# Patient Record
Sex: Female | Born: 1945 | Race: White | Hispanic: No | Smoking: Never smoker
Health system: Southern US, Community
[De-identification: ages and names within clinical notes are randomized; demographics above are authoritative.]

## PROBLEM LIST (undated history)

## (undated) DIAGNOSIS — M5030 Other cervical disc degeneration, unspecified cervical region: Secondary | ICD-10-CM

## (undated) DIAGNOSIS — M542 Cervicalgia: Secondary | ICD-10-CM

## (undated) DIAGNOSIS — M5481 Occipital neuralgia: Secondary | ICD-10-CM

## (undated) DIAGNOSIS — G4700 Insomnia, unspecified: Secondary | ICD-10-CM

## (undated) DIAGNOSIS — M47812 Spondylosis without myelopathy or radiculopathy, cervical region: Secondary | ICD-10-CM

## (undated) DIAGNOSIS — N329 Bladder disorder, unspecified: Secondary | ICD-10-CM

## (undated) DIAGNOSIS — R935 Abnormal findings on diagnostic imaging of other abdominal regions, including retroperitoneum: Secondary | ICD-10-CM

## (undated) DIAGNOSIS — M810 Age-related osteoporosis without current pathological fracture: Secondary | ICD-10-CM

## (undated) DIAGNOSIS — E785 Hyperlipidemia, unspecified: Secondary | ICD-10-CM

## (undated) DIAGNOSIS — K219 Gastro-esophageal reflux disease without esophagitis: Secondary | ICD-10-CM

## (undated) DIAGNOSIS — G43909 Migraine, unspecified, not intractable, without status migrainosus: Secondary | ICD-10-CM

## (undated) DIAGNOSIS — F5081 Binge eating disorder: Secondary | ICD-10-CM

## (undated) DIAGNOSIS — F50819 Binge eating disorder, unspecified: Secondary | ICD-10-CM

## (undated) HISTORY — DX: Bladder disorder, unspecified: N32.9

## (undated) HISTORY — DX: Hyperlipidemia, unspecified: E78.5

## (undated) HISTORY — PX: TUBAL LIGATION: SHX77

## (undated) HISTORY — DX: Insomnia, unspecified: G47.00

## (undated) HISTORY — PX: FOOT SURGERY: SHX648

## (undated) HISTORY — DX: Migraine, unspecified, not intractable, without status migrainosus: G43.909

## (undated) HISTORY — PX: ADENOIDECTOMY: SUR15

---

## 1998-03-05 ENCOUNTER — Ambulatory Visit (HOSPITAL_COMMUNITY): Admission: RE | Admit: 1998-03-05 | Discharge: 1998-03-05 | Payer: Self-pay | Admitting: Obstetrics and Gynecology

## 1998-03-05 ENCOUNTER — Other Ambulatory Visit: Admission: RE | Admit: 1998-03-05 | Discharge: 1998-03-05 | Payer: Self-pay | Admitting: Obstetrics and Gynecology

## 1998-03-21 ENCOUNTER — Other Ambulatory Visit: Admission: RE | Admit: 1998-03-21 | Discharge: 1998-03-21 | Payer: Self-pay | Admitting: *Deleted

## 1999-04-30 ENCOUNTER — Ambulatory Visit (HOSPITAL_COMMUNITY): Admission: RE | Admit: 1999-04-30 | Discharge: 1999-04-30 | Payer: Self-pay | Admitting: Gastroenterology

## 1999-04-30 ENCOUNTER — Encounter (INDEPENDENT_AMBULATORY_CARE_PROVIDER_SITE_OTHER): Payer: Self-pay | Admitting: Specialist

## 2000-03-30 ENCOUNTER — Other Ambulatory Visit: Admission: RE | Admit: 2000-03-30 | Discharge: 2000-03-30 | Payer: Self-pay | Admitting: Obstetrics and Gynecology

## 2000-07-08 ENCOUNTER — Ambulatory Visit (HOSPITAL_COMMUNITY): Admission: RE | Admit: 2000-07-08 | Discharge: 2000-07-08 | Payer: Self-pay | Admitting: Obstetrics and Gynecology

## 2000-07-08 ENCOUNTER — Encounter: Payer: Self-pay | Admitting: Obstetrics and Gynecology

## 2001-04-04 ENCOUNTER — Other Ambulatory Visit: Admission: RE | Admit: 2001-04-04 | Discharge: 2001-04-04 | Payer: Self-pay | Admitting: Obstetrics and Gynecology

## 2002-03-08 ENCOUNTER — Encounter: Payer: Self-pay | Admitting: Obstetrics and Gynecology

## 2002-03-08 ENCOUNTER — Ambulatory Visit (HOSPITAL_COMMUNITY): Admission: RE | Admit: 2002-03-08 | Discharge: 2002-03-08 | Payer: Self-pay | Admitting: Obstetrics and Gynecology

## 2002-05-18 ENCOUNTER — Other Ambulatory Visit: Admission: RE | Admit: 2002-05-18 | Discharge: 2002-05-18 | Payer: Self-pay | Admitting: Obstetrics and Gynecology

## 2003-05-25 ENCOUNTER — Encounter: Admission: RE | Admit: 2003-05-25 | Discharge: 2003-05-25 | Payer: Self-pay | Admitting: Family Medicine

## 2003-07-09 ENCOUNTER — Other Ambulatory Visit: Admission: RE | Admit: 2003-07-09 | Discharge: 2003-07-09 | Payer: Self-pay | Admitting: Obstetrics and Gynecology

## 2003-10-19 ENCOUNTER — Emergency Department (HOSPITAL_COMMUNITY): Admission: EM | Admit: 2003-10-19 | Discharge: 2003-10-19 | Payer: Self-pay | Admitting: Emergency Medicine

## 2004-06-10 ENCOUNTER — Ambulatory Visit: Payer: Self-pay | Admitting: Gastroenterology

## 2004-07-01 ENCOUNTER — Ambulatory Visit: Payer: Self-pay | Admitting: Gastroenterology

## 2004-07-01 LAB — HM COLONOSCOPY: HM Colonoscopy: NORMAL

## 2004-08-13 ENCOUNTER — Encounter: Admission: RE | Admit: 2004-08-13 | Discharge: 2004-08-13 | Payer: Self-pay | Admitting: Orthopedic Surgery

## 2005-12-30 ENCOUNTER — Ambulatory Visit: Payer: Self-pay | Admitting: Family Medicine

## 2006-01-06 ENCOUNTER — Ambulatory Visit: Payer: Self-pay | Admitting: Family Medicine

## 2006-01-07 ENCOUNTER — Emergency Department (HOSPITAL_COMMUNITY): Admission: EM | Admit: 2006-01-07 | Discharge: 2006-01-07 | Payer: Self-pay | Admitting: Emergency Medicine

## 2006-05-14 ENCOUNTER — Ambulatory Visit: Payer: Self-pay | Admitting: Family Medicine

## 2006-05-18 ENCOUNTER — Ambulatory Visit: Payer: Self-pay | Admitting: Internal Medicine

## 2006-06-30 ENCOUNTER — Ambulatory Visit: Payer: Self-pay | Admitting: Family Medicine

## 2006-08-20 ENCOUNTER — Ambulatory Visit: Payer: Self-pay | Admitting: Family Medicine

## 2006-08-27 ENCOUNTER — Ambulatory Visit: Payer: Self-pay | Admitting: Family Medicine

## 2006-08-27 LAB — CONVERTED CEMR LAB
ALT: 16 units/L (ref 0–40)
AST: 24 units/L (ref 0–37)
HDL: 57.6 mg/dL (ref >39.0)
LDL Cholesterol: 65 mg/dL (ref 0–99)
VLDL: 15 mg/dL (ref 0–40)

## 2006-09-06 ENCOUNTER — Encounter: Admission: RE | Admit: 2006-09-06 | Discharge: 2006-09-06 | Payer: Self-pay | Admitting: Obstetrics and Gynecology

## 2006-09-14 ENCOUNTER — Ambulatory Visit: Payer: Self-pay | Admitting: Family Medicine

## 2007-01-12 ENCOUNTER — Ambulatory Visit: Payer: Self-pay | Admitting: Family Medicine

## 2007-01-12 DIAGNOSIS — G4700 Insomnia, unspecified: Secondary | ICD-10-CM | POA: Insufficient documentation

## 2007-01-12 DIAGNOSIS — R519 Headache, unspecified: Secondary | ICD-10-CM | POA: Insufficient documentation

## 2007-01-12 DIAGNOSIS — E785 Hyperlipidemia, unspecified: Secondary | ICD-10-CM | POA: Insufficient documentation

## 2007-01-12 DIAGNOSIS — R51 Headache: Secondary | ICD-10-CM

## 2007-01-12 LAB — CONVERTED CEMR LAB
Basophils Absolute: 0.1 10*3/uL (ref 0.0–0.1)
Cholesterol: 185 mg/dL (ref 0–200)
HCT: 39.6 % (ref 36.0–46.0)
HDL: 56.7 mg/dL (ref >39.0)
Hemoglobin: 13.7 g/dL (ref 12.0–15.0)
LDL Cholesterol: 112 mg/dL — ABNORMAL HIGH (ref 0–99)
Lymphocytes Relative: 32.4 % (ref 12.0–46.0)
MCHC: 34.6 g/dL (ref 30.0–36.0)
Monocytes Absolute: 0.3 10*3/uL (ref 0.2–0.7)
Monocytes Relative: 7.1 % (ref 3.0–11.0)
Neutro Abs: 2.6 10*3/uL (ref 1.4–7.7)
Neutrophils Relative %: 56.8 % (ref 43.0–77.0)
RDW: 12.8 % (ref 11.5–14.6)
TSH: 1.42 microintl units/mL (ref 0.35–5.50)

## 2007-01-13 ENCOUNTER — Telehealth (INDEPENDENT_AMBULATORY_CARE_PROVIDER_SITE_OTHER): Payer: Self-pay | Admitting: *Deleted

## 2007-03-29 ENCOUNTER — Ambulatory Visit: Payer: Self-pay | Admitting: Family Medicine

## 2007-03-29 DIAGNOSIS — M279 Disease of jaws, unspecified: Secondary | ICD-10-CM | POA: Insufficient documentation

## 2007-03-30 ENCOUNTER — Encounter (INDEPENDENT_AMBULATORY_CARE_PROVIDER_SITE_OTHER): Payer: Self-pay | Admitting: Family Medicine

## 2007-04-19 ENCOUNTER — Telehealth (INDEPENDENT_AMBULATORY_CARE_PROVIDER_SITE_OTHER): Payer: Self-pay | Admitting: *Deleted

## 2007-04-25 ENCOUNTER — Encounter (INDEPENDENT_AMBULATORY_CARE_PROVIDER_SITE_OTHER): Payer: Self-pay | Admitting: Family Medicine

## 2007-05-17 ENCOUNTER — Encounter (INDEPENDENT_AMBULATORY_CARE_PROVIDER_SITE_OTHER): Payer: Self-pay | Admitting: Family Medicine

## 2007-06-15 ENCOUNTER — Telehealth (INDEPENDENT_AMBULATORY_CARE_PROVIDER_SITE_OTHER): Payer: Self-pay | Admitting: *Deleted

## 2007-08-11 ENCOUNTER — Telehealth (INDEPENDENT_AMBULATORY_CARE_PROVIDER_SITE_OTHER): Payer: Self-pay | Admitting: *Deleted

## 2007-08-22 ENCOUNTER — Ambulatory Visit: Payer: Self-pay | Admitting: Family Medicine

## 2007-08-22 DIAGNOSIS — R42 Dizziness and giddiness: Secondary | ICD-10-CM | POA: Insufficient documentation

## 2007-08-23 ENCOUNTER — Encounter (INDEPENDENT_AMBULATORY_CARE_PROVIDER_SITE_OTHER): Payer: Self-pay | Admitting: *Deleted

## 2007-08-23 LAB — CONVERTED CEMR LAB
ALT: 23 units/L (ref 0–35)
Cholesterol: 176 mg/dL (ref 0–200)
LDL Cholesterol: 73 mg/dL (ref 0–99)
Total CHOL/HDL Ratio: 2
Triglycerides: 75 mg/dL (ref 0–149)

## 2007-08-29 ENCOUNTER — Telehealth (INDEPENDENT_AMBULATORY_CARE_PROVIDER_SITE_OTHER): Payer: Self-pay | Admitting: *Deleted

## 2007-10-27 ENCOUNTER — Encounter: Admission: RE | Admit: 2007-10-27 | Discharge: 2007-10-27 | Payer: Self-pay | Admitting: Obstetrics and Gynecology

## 2009-02-11 ENCOUNTER — Encounter: Admission: RE | Admit: 2009-02-11 | Discharge: 2009-02-11 | Payer: Self-pay | Admitting: Obstetrics and Gynecology

## 2009-02-22 ENCOUNTER — Encounter: Admission: RE | Admit: 2009-02-22 | Discharge: 2009-02-22 | Payer: Self-pay | Admitting: Obstetrics and Gynecology

## 2009-06-19 ENCOUNTER — Encounter (INDEPENDENT_AMBULATORY_CARE_PROVIDER_SITE_OTHER): Payer: Self-pay | Admitting: *Deleted

## 2010-05-01 ENCOUNTER — Encounter: Admission: RE | Admit: 2010-05-01 | Discharge: 2010-05-01 | Payer: Self-pay | Admitting: Obstetrics and Gynecology

## 2010-06-19 ENCOUNTER — Ambulatory Visit: Admit: 2010-06-19 | Payer: Self-pay | Admitting: Internal Medicine

## 2010-07-06 ENCOUNTER — Encounter: Payer: Self-pay | Admitting: Obstetrics and Gynecology

## 2010-07-07 ENCOUNTER — Encounter: Payer: Self-pay | Admitting: Obstetrics and Gynecology

## 2010-07-17 NOTE — Letter (Signed)
Summary: Colonoscopy Date Change Letter  [facility]  [address] [place], [place] [address]   Phone: [phone]  Fax: [phone]      June 19, 2009 MRN: [id number]   Kylie Wheeler [address] [place], [place]  [address]   Dear Ms. UNDERWOOD,   Previously you were recommended to have a repeat colonoscopy around this time. Your chart was recently reviewed by Dr. Arlyce Dice of [facility]. Follow up colonoscopy is now recommended in Jan. 2013. This revised recommendation is based on current, nationally recognized guidelines for colorectal cancer screening and polyp surveillance. These guidelines are endorsed by the American Cancer Society, [employer] on Colorectal Cancer as well as numerous other major medical organizations.  Please understand that our recommendation assumes that you do not have any new symptoms such as bleeding, a change in bowel habits, anemia, or significant abdominal discomfort. If you do have any concerning GI symptoms or want to discuss the guideline recommendations, please call to arrange an office visit at your earliest convenience. Otherwise we will keep you in our reminder system and contact you 1-2 months prior to the date listed above to schedule your next colonoscopy.  Thank you,  Barbette Hair. Arlyce Dice, M.D.  [facility] [phone]

## 2010-08-14 ENCOUNTER — Encounter (INDEPENDENT_AMBULATORY_CARE_PROVIDER_SITE_OTHER): Payer: 59 | Admitting: Internal Medicine

## 2010-08-14 ENCOUNTER — Other Ambulatory Visit: Payer: Self-pay | Admitting: Internal Medicine

## 2010-08-14 ENCOUNTER — Encounter: Payer: Self-pay | Admitting: Internal Medicine

## 2010-08-14 DIAGNOSIS — E785 Hyperlipidemia, unspecified: Secondary | ICD-10-CM

## 2010-08-14 DIAGNOSIS — Z0000 Encounter for general adult medical examination without abnormal findings: Secondary | ICD-10-CM

## 2010-08-14 DIAGNOSIS — Z860100 Personal history of colon polyps, unspecified: Secondary | ICD-10-CM | POA: Insufficient documentation

## 2010-08-14 DIAGNOSIS — M810 Age-related osteoporosis without current pathological fracture: Secondary | ICD-10-CM | POA: Insufficient documentation

## 2010-08-14 DIAGNOSIS — E559 Vitamin D deficiency, unspecified: Secondary | ICD-10-CM | POA: Insufficient documentation

## 2010-08-14 DIAGNOSIS — Z8249 Family history of ischemic heart disease and other diseases of the circulatory system: Secondary | ICD-10-CM

## 2010-08-14 LAB — CONVERTED CEMR LAB
Cholesterol, target level: 200 mg/dL
HDL goal, serum: 40 mg/dL
LDL Goal: 160 mg/dL

## 2010-08-14 LAB — CBC WITH DIFFERENTIAL/PLATELET
Basophils Absolute: 0 10*3/uL (ref 0.0–0.1)
Eosinophils Absolute: 0.1 10*3/uL (ref 0.0–0.7)
MCHC: 34.5 g/dL (ref 30.0–36.0)
MCV: 93.8 fl (ref 78.0–100.0)
Monocytes Absolute: 0.3 10*3/uL (ref 0.1–1.0)
Neutrophils Relative %: 54.7 % (ref 43.0–77.0)
Platelets: 199 10*3/uL (ref 150.0–400.0)

## 2010-08-14 LAB — BASIC METABOLIC PANEL
BUN: 14 mg/dL (ref 6–23)
CO2: 30 mEq/L (ref 19–32)
Calcium: 9.5 mg/dL (ref 8.4–10.5)
Chloride: 102 mEq/L (ref 96–112)
Creatinine, Ser: 0.9 mg/dL (ref 0.4–1.2)

## 2010-08-14 LAB — HEPATIC FUNCTION PANEL
Bilirubin, Direct: 0.1 mg/dL (ref 0.0–0.3)
Total Bilirubin: 0.7 mg/dL (ref 0.3–1.2)
Total Protein: 6.9 g/dL (ref 6.0–8.3)

## 2010-08-15 LAB — CONVERTED CEMR LAB: Vit D, 25-Hydroxy: 23 ng/mL — ABNORMAL LOW (ref 30–89)

## 2010-08-21 NOTE — Assessment & Plan Note (Signed)
Summary: cpx/fasting/kn/ph   Vital Signs:  Patient profile:   65 year old female Height:      61.75 inches Weight:      114.8 pounds BMI:     21.24 Temp:     98.0 degrees F oral Pulse rate:   63 / minute Resp:     14 per minute BP sitting:   131 / 73  (left arm) Cuff size:   regular  Vitals Entered By: Shonna Chock CMA (August 14, 2010 9:44 AM) CC: CPX with fasting labs, EKG completed by insurance company 2 days ago, Lipid Management Comments Patient taking med for Migraines: ? name ? dose    CC:  CPX with fasting labs, EKG completed by insurance company 2 days ago, and Lipid Management.  History of Present Illness:    [name] is here for a physical; she is asymptomatic. She has not been on a statin 18 months ago ; " I was told I didn't need it". She  denies the following symptoms: chest pain/pressure, exercise intolerance, dypsnea, palpitations, syncope, and pedal edema.  Dietary compliance has been good.  The patient reports no exercise.  Adjunctive measures currently used by the patient include fiber and fish oil supplements.    Lipid Management History:      Positive NCEP/ATP III risk factors include female age 38 years old or older.  Negative NCEP/ATP III risk factors include non-diabetic, HDL cholesterol greater than 60, no family history for ischemic heart disease, non-tobacco-user status, non-hypertensive, no ASHD (atherosclerotic heart disease), no prior stroke/TIA, no peripheral vascular disease, and no history of aortic aneurysm.     Current Medications (verified): 1)  Trazodone Hcl 50 Mg Tabs (Trazodone Hcl) .... 2 By Mouth At Bedtime 2)  Clorazepate Dipotassium 7.5 Mg Tabs (Clorazepate Dipotassium) .... 2 By Mouth Once Daily 3)  Actonel 150 Mg Tabs (Risedronate Sodium) .Marland Kitchen.. 1 By Mouth Monthly  Allergies (verified): 1)  ! Codeine  Past History:  Past Medical History: Headache, migraines, Dr Vela Prose Hyperlipidemia: [place] Study LDL goal = <  160. 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         "Gas" an issue for 12-18 mos.  Physical Exam  General:  well-nourished;alert,appropriate and cooperative throughout examination Head:  Normocephalic and atraumatic without obvious abnormalities. Eyes:  No corneal or conjunctival inflammation noted. Perrla. Funduscopic exam benign, without hemorrhages, exudates or papilledema.  Ears:  External ear exam shows no significant lesions or deformities.  Otoscopic examination reveals clear canals, tympanic membranes are intact bilaterally without bulging, retraction, inflammation or discharge. Hearing is grossly normal bilaterally. Nose:  External nasal examination shows no deformity or inflammation. Nasal mucosa are pink and moist without lesions or exudates. Septal dislocation & deviation Mouth:  Oral mucosa and oropharynx without lesions or exudates.  Teeth in good repair.  Neck:  No deformities, masses, or tenderness noted. Lungs:  Normal respiratory effort, chest expands symmetrically. Lungs are clear to auscultation, no crackles or wheezes. Heart:  regular rhythm, no murmur, no gallop, no rub, no JVD, no  HJR, and bradycardia.   Abdomen:  Bowel sounds positive,abdomen soft and non-tender without masses, organomegaly or hernias noted. Aorta palpable; no AAA Genitalia:  as per Gyn Msk:  Scoliosis Pulses:  R and L carotid,radial,dorsalis pedis and posterior tibial pulses are full and equal bilaterally Extremities:  No clubbing, cyanosis, edema, or deformity noted with normal full range of motion of all joints.  Crepitus of kneees Neurologic:  alert & oriented X3 and DTRs symmetrical and 1/2+ @ knees Skin:  Intact without suspicious lesions or rashes. Small keratotic lesion Cervical Nodes:  No lymphadenopathy noted Axillary Nodes:  No palpable lymphadenopathy Psych:  memory intact for recent and remote, normally interactive, and good eye contact.     Impression & Recommendations:  Problem # 1:  ROUTINE GENERAL MEDICAL EXAM@HEALTH  CARE FACL (ICD-V70.0)  Orders: Venipuncture (16109) TLB-BMP (Basic Metabolic Panel-BMET) (80048-METABOL) TLB-CBC Platelet - w/Differential (85025-CBCD) TLB-Hepatic/Liver Function Pnl (80076-HEPATIC) TLB-TSH (Thyroid Stimulating Hormone) (84443-TSH) T-Vitamin D (25-Hydroxy) (60454-09811) T- * Misc. Laboratory test [phone])  Problem # 2:  HYPERLIPIDEMIA (ICD-272.4)  The following medications were removed from the medication list:    Zocor 40 Mg Tabs (Simvastatin) .Marland Kitchen... Take 1/2 tablet once daily  Problem # 3:  OSTEOPOROSIS (ICD-733.00)  Her updated medication list for this problem includes:    Actonel 150 Mg Tabs (Risedronate sodium) .Marland Kitchen... 1 by mouth monthly  Problem # 4:  COLONIC POLYPS, HX OF (ICD-V12.72)  Problem # 5:  ISCHEMIC HEART DISEASE, FAMILY HX (ICD-V17.3)  Complete Medication List: 1)  Trazodone Hcl 50 Mg Tabs (Trazodone hcl) .... 2 by mouth at bedtime 2)  Clorazepate Dipotassium 7.5 Mg Tabs (Clorazepate dipotassium) .... 2 by mouth once daily 3)  Actonel 150 Mg Tabs (Risedronate sodium) .Marland Kitchen.. 1 by mouth monthly  Lipid Assessment/Plan:       Based on NCEP/ATP III, the patient's risk factor category is "0-1 risk factors".  The patient's lipid goals are as follows: Total cholesterol goal is 200; LDL cholesterol goal is 160; HDL cholesterol goal is 40; Triglyceride goal is 150.    Patient Instructions: 1)  Obtain copy of insurance EKG if possible. Trial of Align once daily as needed for gas. Keep a food diary as discussed. Prescriptions: TRAZODONE HCL 50 MG TABS (TRAZODONE HCL) 2 by mouth at bedtime  #180 x 0   Entered and Authorized by:   Marga Melnick MD   Signed by:   Marga Melnick MD on 08/14/2010   Method used:   Print then Give to Patient   RxID:   [id number] CLORAZEPATE DIPOTASSIUM 7.5 MG TABS (CLORAZEPATE DIPOTASSIUM) 2 by mouth once daily  #180 x 0   Entered and Authorized by:   Marga Melnick MD   Signed by:   Marga Melnick MD on 08/14/2010   Method used:   Print then Give to Patient   RxID:   [id number]    Orders Added: 1)  Est. Patient 40-64 years [99396] 2)  Venipuncture [72536] 3)  TLB-BMP (Basic Metabolic Panel-BMET) [80048-METABOL] 4)  TLB-CBC Platelet - w/Differential [85025-CBCD] 5)  TLB-Hepatic/Liver Function Pnl [80076-HEPATIC] 6)  TLB-TSH (Thyroid Stimulating Hormone) [84443-TSH] 7)  T-Vitamin D (25-Hydroxy) [64403-47425] 8)  T- * Misc. Laboratory test [phone]  Appended Document: cpx/fasting/kn/ph

## 2010-08-26 NOTE — Letter (Signed)
Summary: [employer] Personalized Risk Profile  [employer] Personalized Risk Profile   Imported By: Maryln Gottron 08/19/2010 10:03:28  _____________________________________________________________________  External Attachment:    Type:   Image     Comment:   External Document

## 2010-09-05 ENCOUNTER — Encounter: Payer: Self-pay | Admitting: Internal Medicine

## 2010-10-31 NOTE — Assessment & Plan Note (Signed)
[facility]                        [name]   Kylie, Wheeler                    MRN:          [id number]  DATE:06/30/2006                            DOB:          1946-03-28    REASON FOR VISIT:  Eye problem.  [name] presents today stating  that she went to see her ophthalmologist, Dr. Lovell Sheehan, and he noticed a  significant decrease in the acuity of her left eye.  He has scheduled  her for a dye test next week.  She states that she was talking to her  friends about this and they made her aware that she had an injury before  Thanksgiving.  The patient states that she has fallen and hit the back  of her head on the left side.  She had a large knot on the side of the  head.  She told a friend about this and the friend expressed a concern  stating that a person she knew died suddenly after having a dye test.  According to her friend, the patient did not advise the doctor of a  previous head injury, and they think that is what complicated the  situation.  She was recommended to follow up with me by her friend.  The  patient states that she never passed out when she fell and hit her head.  She was just concerned based on the information provided by her friend.  She is scheduled again, on Monday, to have a dye test to assess the  reason for decreased acuity of her left eye.  She has not advised Dr.  Lovell Sheehan of the fall.   MEDICATIONS:  1. Zocor 40 mg.  2. Ambien CR 12.5 nightly.  3. Protonix 40 mg daily.  4. Tranxene 1/2 in the morning and 1 at night dose unknown at this      point.  5. Frova 2.5 mg as needed for migraines.   ALLERGIES:  No known drug allergies.   OBJECTIVE:  The patient reclined.  Weight checked.  Pulse 60.  Blood  pressure 140/80.  GENERAL:  We have a pleasant female in no acute distress.  HEENT:  Normocephalic, atraumatic.  There are no obvious lesions noted  on the scalp.  Pupils were both  equal and reactive to light.  Extraocular motions intact.  Fundi was benign-appearing on my  examination.  NEUROLOGIC:  Nonfocal.   IMPRESSION:  21. A 65 year old female with noted history of decreased acuity of the      left eye, scheduled to have further evaluation by Dr. Lovell Sheehan.  2. Remote history of head trauma prior to Thanksgiving.  She wants to      make sure there is no contraindication to the dye test.   PLAN:  I advised the patient to call Dr. Lovell Sheehan and advise him of the  above.  He may recommend additional testing prior to doing the dye test.  Advised her to not have the test until  she has spoken to him and he has made his recommendations.  I also  advised her to call me  with his  recommendations prior to having this test.  The patient  expressed understanding.  I refilled her Frova 2.5 mg with 6 refills to  use as needed.     [name], M.D.  Electronically Signed    LA/MedQ  DD: 06/30/2006  DT: 06/30/2006  Job #: [id number]

## 2010-10-31 NOTE — Assessment & Plan Note (Signed)
[facility]                          [name]   Kylie Wheeler, Kylie Wheeler                    MRN:          [id number]  DATE:12/30/2005                            DOB:          1945/07/05    Please see complete physical examination form for further details.  Additionally, the patient has several complaints.  1.  She complains of intermittent upset stomach associated with left chest      pain.  This has been assessed in the past by cardiology.  According to      the patient, she had a normal Cardiolite stress test two years ago.  She      states that her symptoms continue intermittently.  Over the last several      months, she has noted a correlation between foods, stomach pain, and      chest discomfort.  On further questioning, she has a history of      recurrent migraines that she treats on a regular basis with Lodine and      Cafergot.  She has been doing this for years.  She describes her      sensation as some nausea and mild epigastric discomfort.  It is      sometimes associated with food but also can happen on an empty stomach.      She denies any vomiting or diarrhea.  She denies any hemoptysis or blood      in her stool.  2.  She also complains of a long history of numbness on the tip of her      fingers, left a little greater than right.  It sometimes radiates up her      forearm.  Not associated with any other symptoms.  She does have a      history of intermittent neck pain and revealed that she does have a      history of degenerative disk disease.  3.  [name] also has a history of insomnia and used to be on      nortriptyline which was eventually switched over to Ambien 10 mg.  After      having significant side effects from Ambien, she was switched over to      Ambien CR 12.5.  Unfortunately, she states that she has difficulties      with waking up in the middle of the night with the most recent medicine,    i.e. Ambien CR.  She would prefer to go back to regular Ambien and would      consider nortriptyline again.  Of note, she is scheduled to see a      psychiatrist in the near future to be restarted on an antidepressant.      She was advised by her therapist and given a name of a psychiatrist.   Please see physical examination form for further details regarding the  physical exam.   IMPRESSION:  1.  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  2.  Insomnia.  3.  Upper extremity paresthesia, left greater  than right.   PLAN:  1.  We will check an H. pylori.  2.  We will start patient on Nexium 40 mg daily for the next four weeks.      She is to follow up thereafter.  3.  Given that Lodine has worked for the patient, and her neurologist feels      she should continue, I advised her to minimize the use, which according      to the patient, she was also recommended to have by her neurologist.  4.  In regard to her insomnia, we will change her prescription back to      Ambien 5 mg to 10 mg q.h.s. daily.  She was given a prescription for 90      days with one refill.  5.  In regard to her upper extremity paresthesia/neuropathy, we will refer      patient to neurology      for further assessment.  6.  Please see physical examination form for further details regarding her      annual exam.                                   [name], MD   [facility]  DD:  12/30/2005  DT:  12/30/2005  Job #:  [id number]

## 2010-11-22 ENCOUNTER — Encounter: Payer: Self-pay | Admitting: Internal Medicine

## 2010-11-25 ENCOUNTER — Ambulatory Visit (INDEPENDENT_AMBULATORY_CARE_PROVIDER_SITE_OTHER): Payer: 59 | Admitting: Internal Medicine

## 2010-11-25 ENCOUNTER — Encounter: Payer: Self-pay | Admitting: Internal Medicine

## 2010-11-25 DIAGNOSIS — E559 Vitamin D deficiency, unspecified: Secondary | ICD-10-CM

## 2010-11-25 DIAGNOSIS — E785 Hyperlipidemia, unspecified: Secondary | ICD-10-CM

## 2010-11-25 NOTE — Patient Instructions (Signed)
Please review Dr Gildardo Griffes book eat, Drink & Be Healthy for info on dietary cholesterol.Preventive Health Care: Exercise  30-45  minutes a day, 3-4 days a week. Walking is especially valuable in preventing Osteoporosis. Eat a low-fat diet with lots of fruits and vegetables, up to 7-9 servings per day.  Vitamin D3 1000 IU  TWO pills a day. Please  schedule fasting Labs : NMR lipoprofile  & vit D level (272.4, 268.9) in 4 months

## 2010-11-25 NOTE — Progress Notes (Signed)
  Subjective:    Patient ID: Kylie Wheeler, female    DOB: [birth year], 65 y.o.   MRN: [id number]  HPI Dyslipidemia assessment: Prior Advanced Lipid Testing: no.   Family history of premature CAD/ MI: PGF & MGF MI in 57s & 64s .  Nutrition: heart healthy .  Exercise : decreased since 04/2010   . Diabetes : A1c 5.2% . HTN: no. Smoking history  : minimally X 2 years .   Weight :  Up 8#. ROS: fatigue: no ; chest pain : no ;claudication: no; palpitations: no; abd pain/bowel changes: no ; myalgias:no;  syncope : no ; memory loss: no;skin changes: no. Lab results reviewed :[place] Heart Panel:LDL 143; increased absortion.     Review of Systems on vit D 3 weeks     Objective:   Physical Exam Gen.: Thin but healthy and well-nourished in appearance. Alert, appropriate and cooperative throughout exam. Lungs: Normal respiratory effort; chest expands symmetrically. Lungs are clear to auscultation without rales, wheezes, or increased work of breathing. Heart: Normal rate and rhythm. Normal S1 and S2. No gallop, click, or rub. No  murmur. Vascular: Carotid, radial artery, dorsalis pedis and dorsalis posterior tibial pulses are full and equal. No bruits present. Neurologic: Alert and oriented x3.   Skin: Intact without suspicious lesions or rashes. Psych: Mood and affect are normal. Normally interactive                                                                                         Assessment & Plan:  #1 Dyslipidemia #2 vit D def Plan: TLC ; NMR Lipoprofile & vit D level in 4 mos

## 2011-01-29 ENCOUNTER — Ambulatory Visit: Payer: 59

## 2011-01-29 ENCOUNTER — Encounter (INDEPENDENT_AMBULATORY_CARE_PROVIDER_SITE_OTHER): Payer: 59 | Admitting: Family Medicine

## 2011-01-29 VITALS — Temp 97.7°F

## 2011-01-29 DIAGNOSIS — Z23 Encounter for immunization: Secondary | ICD-10-CM

## 2011-01-29 DIAGNOSIS — Z2911 Encounter for prophylactic immunotherapy for respiratory syncytial virus (RSV): Secondary | ICD-10-CM

## 2011-01-30 ENCOUNTER — Ambulatory Visit: Payer: 59

## 2011-02-01 NOTE — Progress Notes (Signed)
This encounter was created in error - please disregard.

## 2011-05-27 ENCOUNTER — Telehealth: Payer: Self-pay | Admitting: Internal Medicine

## 2011-05-27 NOTE — Telephone Encounter (Signed)
Dr.Hopper please advise  Left message on voicemail informing patient Dr.Hopper is out of the office this afternoon and I will call her in the am with response.

## 2011-05-28 MED ORDER — OSELTAMIVIR PHOSPHATE 75 MG PO CAPS: 75.0000 mg | ORAL_CAPSULE | Freq: Every day | ORAL | Status: DC

## 2011-05-28 NOTE — Telephone Encounter (Signed)
Patient aware rx to be sent in

## 2011-05-28 NOTE — Telephone Encounter (Signed)
Tamiflu 75 mg dispense 10 one daily

## 2011-06-30 ENCOUNTER — Encounter: Payer: Self-pay | Admitting: Gastroenterology

## 2011-06-30 DIAGNOSIS — R5383 Other fatigue: Secondary | ICD-10-CM | POA: Diagnosis not present

## 2011-06-30 DIAGNOSIS — R7989 Other specified abnormal findings of blood chemistry: Secondary | ICD-10-CM | POA: Diagnosis not present

## 2011-06-30 DIAGNOSIS — R5381 Other malaise: Secondary | ICD-10-CM | POA: Diagnosis not present

## 2011-06-30 DIAGNOSIS — E559 Vitamin D deficiency, unspecified: Secondary | ICD-10-CM | POA: Diagnosis not present

## 2011-07-01 DIAGNOSIS — R5381 Other malaise: Secondary | ICD-10-CM | POA: Diagnosis not present

## 2011-07-01 DIAGNOSIS — R5383 Other fatigue: Secondary | ICD-10-CM | POA: Diagnosis not present

## 2011-07-05 DIAGNOSIS — R5381 Other malaise: Secondary | ICD-10-CM | POA: Diagnosis not present

## 2011-07-06 DIAGNOSIS — R5381 Other malaise: Secondary | ICD-10-CM | POA: Diagnosis not present

## 2011-07-18 DIAGNOSIS — B009 Herpesviral infection, unspecified: Secondary | ICD-10-CM | POA: Diagnosis not present

## 2011-07-18 DIAGNOSIS — J0110 Acute frontal sinusitis, unspecified: Secondary | ICD-10-CM | POA: Diagnosis not present

## 2011-07-23 ENCOUNTER — Telehealth: Payer: Self-pay | Admitting: Internal Medicine

## 2011-07-23 NOTE — Telephone Encounter (Signed)
[facility] Triage Call Report Triage Record Num: [id number] Operator: Durward Mallard DiMatteis Patient Name: Kylie Wheeler Call Date & Time: 07/23/2011 12:01:00PM Patient Phone: [phone] PCP: Marga Melnick Patient Gender: Female PCP Fax : [phone] Patient DOB: 07-26-1945 Practice Name: [facility] Reason for Call: Caller: Aja/Patient; PCP: Marga Melnick; CB#: [phone]; Call regarding [name]; she was taking Amoxicillin from [place] while on vacation to treat sinus infection; she started having thrush sx today on 07/23/11; she has a dry tongue and this is what she does when she is getting thrush; Triaged per Mouth Lesions Guideline; See in 72 hr d/t dry tongue and started on new medication; appt made for 07/24/11 at 0800 Dr Alwyn Ren; will comply Protocol(s) Used: Mouth Lesions Recommended Outcome per Protocol: See Provider within 72 Hours Reason for Outcome: Dry or irritated mouth or tongue within several days of beginning prescribed, nonprescription, OR alternative medication Care Advice: ~ Oral stimulants such as sugarless gum or sugarless lemon drops may help. Do not change prescribed medication(s) or dosing regimen until provider is consulted. Discontinue OTC medication(s). ~ ~ See your dentist twice a year to have teeth or dentures examined and cleaned. ~ Call provider if have a swollen lymph gland in front of the ear or under the jaw. ~ Avoid eating sweets. When eating sweets, make sure to brush your teeth immediately afterward. Avoid caffeine (coffee, tea, cola drinks, or chocolate), alcohol, and nicotine (use of tobacco), as use of these substances may worsen symptoms. ~ ~ Try a special toothpaste, mouthwash, lotion, or gum specifically for dry mouth that can be obtained over the counter. ~ SYMPTOM / CONDITION MANAGEMENT ~ CAUTIONS ~ Use a soft bristle toothbrush to gently brush teeth at least twice a day. Floss at least once a day. Increase intake of  fluids. Drinking through a straw may reduce discomfort. Eat foods that are easy to swallow. Avoid drinking carbonated or alcoholic drinks.

## 2011-07-24 ENCOUNTER — Encounter: Payer: Self-pay | Admitting: Internal Medicine

## 2011-07-24 ENCOUNTER — Ambulatory Visit (INDEPENDENT_AMBULATORY_CARE_PROVIDER_SITE_OTHER): Payer: Medicare Other | Admitting: Internal Medicine

## 2011-07-24 VITALS — BP 110/70 | HR 72 | Temp 98.0°F

## 2011-07-24 DIAGNOSIS — J310 Chronic rhinitis: Secondary | ICD-10-CM | POA: Diagnosis not present

## 2011-07-24 DIAGNOSIS — D489 Neoplasm of uncertain behavior, unspecified: Secondary | ICD-10-CM | POA: Diagnosis not present

## 2011-07-24 DIAGNOSIS — K148 Other diseases of tongue: Secondary | ICD-10-CM | POA: Diagnosis not present

## 2011-07-24 DIAGNOSIS — B001 Herpesviral vesicular dermatitis: Secondary | ICD-10-CM

## 2011-07-24 DIAGNOSIS — B009 Herpesviral infection, unspecified: Secondary | ICD-10-CM | POA: Diagnosis not present

## 2011-07-24 MED ORDER — FLUTICASONE PROPIONATE 50 MCG/ACT NA SUSP
1.0000 | Freq: Two times a day (BID) | NASAL | Status: DC | PRN
Start: 2011-07-24 — End: 2012-01-30

## 2011-07-24 MED ORDER — ACYCLOVIR 5 % EX OINT: TOPICAL_OINTMENT | CUTANEOUS | Status: DC

## 2011-07-24 NOTE — Progress Notes (Signed)
  Subjective:    Patient ID: Kylie Wheeler, female    DOB: 1946-02-02, 66 y.o.   MRN: [id number]  HPI She's been on amoxicillin since 07/18/11 for a respiratory infection. Symptoms have improved except for persistent rhinitis.Specifically she denies fever, chills, sweats, or purulent secretions. The rhinitis  has caused a fever blisterat the base of the left nare. In the last 24-36 hours she is noticed dryness of her tongue. She has a past history of oral fungal infection following antibiotics.    Review of Systems  She's had a skin lesion over the right lower back for over a year; she questions the nature of this lesion. She sees Dr. Emily Filbert ,Derm  Objective:   Physical Exam General appearance:good health ;well nourished; no acute distress or increased work of breathing is present.  No  lymphadenopathy about the head, neck, or axilla noted.   Eyes: No conjunctival inflammation or lid edema is present.   Ears:  External ear exam shows no significant lesions or deformities.  Otoscopic examination reveals clear canals, tympanic membranes are intact bilaterally without bulging, retraction, inflammation or discharge.  Nose:  External nasal examination shows herpetic lesion below L nare. Nasal mucosa are dry without lesions or exudates. No septal dislocation or deviation.No obstruction to airflow.   Oral exam: Dental hygiene is good; lips and gums are healthy appearing.There is no oropharyngeal erythema or exudate noted.    Skin: Warm & dry w/o jaundice or tenting; there is an 8 x 9 mm keratotic appearing lesion of the right lower back. There is central loss of pigment and slight elevation of borders.          Assessment & Plan:    #1 dry tongue; no evidence of active fungal infection  #2 herpetic upper lip lesion  #3 neoplasm, uncertain etiology. Rule out basal cell  Plan: Magic mouthwash as needed. Zovirax topically. Derm referral

## 2011-07-24 NOTE — Telephone Encounter (Signed)
Patient was seen today.

## 2011-07-24 NOTE — Patient Instructions (Signed)
Plain Mucinex for thick secretions ;force NON dairy fluids . Use a Neti pot daily as needed for sinus congestion . Magic mouth wash as directed

## 2011-08-12 DIAGNOSIS — R5383 Other fatigue: Secondary | ICD-10-CM | POA: Diagnosis not present

## 2011-08-12 DIAGNOSIS — R5381 Other malaise: Secondary | ICD-10-CM | POA: Diagnosis not present

## 2011-08-20 ENCOUNTER — Other Ambulatory Visit: Payer: Self-pay | Admitting: Dermatology

## 2011-08-20 DIAGNOSIS — L821 Other seborrheic keratosis: Secondary | ICD-10-CM | POA: Diagnosis not present

## 2011-08-20 DIAGNOSIS — D485 Neoplasm of uncertain behavior of skin: Secondary | ICD-10-CM | POA: Diagnosis not present

## 2011-08-29 DIAGNOSIS — N951 Menopausal and female climacteric states: Secondary | ICD-10-CM | POA: Diagnosis not present

## 2011-08-31 DIAGNOSIS — G4700 Insomnia, unspecified: Secondary | ICD-10-CM | POA: Diagnosis not present

## 2011-08-31 DIAGNOSIS — G44219 Episodic tension-type headache, not intractable: Secondary | ICD-10-CM | POA: Diagnosis not present

## 2011-08-31 DIAGNOSIS — G43009 Migraine without aura, not intractable, without status migrainosus: Secondary | ICD-10-CM | POA: Diagnosis not present

## 2011-09-01 ENCOUNTER — Other Ambulatory Visit (INDEPENDENT_AMBULATORY_CARE_PROVIDER_SITE_OTHER): Payer: Medicare Other

## 2011-09-01 ENCOUNTER — Telehealth: Payer: Self-pay | Admitting: Internal Medicine

## 2011-09-01 DIAGNOSIS — N390 Urinary tract infection, site not specified: Secondary | ICD-10-CM

## 2011-09-01 LAB — POCT URINALYSIS DIPSTICK
Ketones, UA: NEGATIVE
Protein, UA: NEGATIVE
Spec Grav, UA: <=1.005

## 2011-09-01 NOTE — Telephone Encounter (Signed)
Ok to schedule a lab appointment (U-Dip, Culture if positive)

## 2011-09-01 NOTE — Telephone Encounter (Signed)
Patient called & would like to do a Urine Culture today & if she needs to come back in we can call her. When results are in call cell# 1st [phone] Office [phone]

## 2011-09-02 ENCOUNTER — Other Ambulatory Visit: Payer: Medicare Other

## 2011-09-04 ENCOUNTER — Telehealth: Payer: Self-pay

## 2011-09-04 NOTE — Telephone Encounter (Signed)
Left message on voicemail for patient to return call when available   

## 2011-09-04 NOTE — Telephone Encounter (Signed)
Message copied by Maurice Small on Fri Sep 04, 2011  8:31 AM ------      Message from: Pecola Lawless      Created: Tue Sep 01, 2011  5:59 PM       The urinalysis is normal.If symptoms persist these may be due to non infectious causes such as bladder  inflammation or spasm (Ex  Interstitial Cystitis). A Urology referral is recommended for recurrent or persistent symptoms to rule out such processes.

## 2011-09-16 NOTE — Telephone Encounter (Signed)
Patient was informed of results 

## 2011-10-07 DIAGNOSIS — R5383 Other fatigue: Secondary | ICD-10-CM | POA: Diagnosis not present

## 2011-10-07 DIAGNOSIS — R5381 Other malaise: Secondary | ICD-10-CM | POA: Diagnosis not present

## 2011-11-05 DIAGNOSIS — R5383 Other fatigue: Secondary | ICD-10-CM | POA: Diagnosis not present

## 2011-11-05 DIAGNOSIS — R5381 Other malaise: Secondary | ICD-10-CM | POA: Diagnosis not present

## 2011-11-21 DIAGNOSIS — M4120 Other idiopathic scoliosis, site unspecified: Secondary | ICD-10-CM | POA: Diagnosis not present

## 2011-11-21 DIAGNOSIS — M5137 Other intervertebral disc degeneration, lumbosacral region: Secondary | ICD-10-CM | POA: Diagnosis not present

## 2011-11-24 ENCOUNTER — Encounter: Payer: Self-pay | Admitting: Internal Medicine

## 2011-11-24 ENCOUNTER — Ambulatory Visit (INDEPENDENT_AMBULATORY_CARE_PROVIDER_SITE_OTHER): Payer: Medicare Other | Admitting: Internal Medicine

## 2011-11-24 VITALS — BP 112/74 | HR 67 | Temp 98.1°F

## 2011-11-24 DIAGNOSIS — N390 Urinary tract infection, site not specified: Secondary | ICD-10-CM | POA: Diagnosis not present

## 2011-11-24 LAB — POCT URINALYSIS DIPSTICK
Bilirubin, UA: NEGATIVE
Leukocytes, UA: NEGATIVE
pH, UA: 6

## 2011-11-24 MED ORDER — CIPROFLOXACIN HCL 500 MG PO TABS: 500.0000 mg | ORAL_TABLET | Freq: Two times a day (BID) | ORAL | Status: AC

## 2011-11-24 NOTE — Patient Instructions (Signed)
cipro for 3 days Drink plenty of fluids Call if symptoms severe or  no better in few days

## 2011-11-24 NOTE — Progress Notes (Signed)
  Subjective:    Patient ID: Kylie Wheeler, female    DOB: [birth year], 66 y.o.   MRN: [id number]  HPI Acute visit 2 weeks history of frequent urination with very little urine. Denies dysuria or gross hematuria. Currently taking an antibiotic for sinusitis (amoxicillin).  Past Medical History  Diagnosis Date  . Migraines   . Hyperlipidemia   . Vitamin d deficiency      Review of Systems No fever or chills. Mild nausea but no vomiting, diarrhea, abdominal or flank pain. Denies any vaginal discharge, vaginal itching or bleeding.     Objective:   Physical Exam  General -- alert, well-developed, and well-nourished.   Abdomen--soft, non-tender, no distention, no masses, no HSM, no guarding, and no rigidity. No CVA tenderness  Extremities-- no pretibial edema bilaterally Psych-- Cognition and judgment appear intact. Alert and cooperative with normal attention span and concentration.  not anxious appearing and not depressed appearing.       Assessment & Plan:   Sx c/w UTI, udip neg. Partially treated UTI? She is on amoxicillin for sinusitis. Will finish amoxicillin in 2 days. Plan: Send a UCX Empiric cipro Call if sx increase or no better  Concerned about possibly getting a yeast infection, recommend to call for a Diflucan if needed

## 2011-11-26 ENCOUNTER — Telehealth: Payer: Self-pay | Admitting: *Deleted

## 2011-11-26 LAB — URINE CULTURE: Colony Count: NO GROWTH

## 2011-11-26 MED ORDER — MAGIC MOUTHWASH: 5.0000 mL | Freq: Three times a day (TID) | ORAL | Status: DC

## 2011-11-26 NOTE — Telephone Encounter (Signed)
Pt left VM that she was started on Cipro but was already taking amoxicillin and now her tongue is white with some crack on it. Pt is worried that she might have thrush. .Please advise

## 2011-11-26 NOTE — Telephone Encounter (Signed)
Per Dr Alwyn Ren magic mouthwash 5 cc tid #90 cc.Discuss with patient, Rx sent.

## 2011-11-27 NOTE — Progress Notes (Signed)
If not better, ask her to come back next week to see PCP, will need to a female exam

## 2011-12-02 DIAGNOSIS — N951 Menopausal and female climacteric states: Secondary | ICD-10-CM | POA: Diagnosis not present

## 2011-12-02 DIAGNOSIS — N898 Other specified noninflammatory disorders of vagina: Secondary | ICD-10-CM | POA: Diagnosis not present

## 2011-12-02 DIAGNOSIS — R350 Frequency of micturition: Secondary | ICD-10-CM | POA: Diagnosis not present

## 2012-01-04 DIAGNOSIS — M25579 Pain in unspecified ankle and joints of unspecified foot: Secondary | ICD-10-CM | POA: Diagnosis not present

## 2012-01-06 DIAGNOSIS — Z124 Encounter for screening for malignant neoplasm of cervix: Secondary | ICD-10-CM | POA: Diagnosis not present

## 2012-01-06 DIAGNOSIS — N952 Postmenopausal atrophic vaginitis: Secondary | ICD-10-CM | POA: Diagnosis not present

## 2012-01-06 DIAGNOSIS — Z01419 Encounter for gynecological examination (general) (routine) without abnormal findings: Secondary | ICD-10-CM | POA: Diagnosis not present

## 2012-01-06 DIAGNOSIS — Z1151 Encounter for screening for human papillomavirus (HPV): Secondary | ICD-10-CM | POA: Diagnosis not present

## 2012-01-07 DIAGNOSIS — D239 Other benign neoplasm of skin, unspecified: Secondary | ICD-10-CM | POA: Diagnosis not present

## 2012-01-07 DIAGNOSIS — D2360 Other benign neoplasm of skin of unspecified upper limb, including shoulder: Secondary | ICD-10-CM | POA: Diagnosis not present

## 2012-01-07 DIAGNOSIS — Z411 Encounter for cosmetic surgery: Secondary | ICD-10-CM | POA: Diagnosis not present

## 2012-01-07 DIAGNOSIS — L821 Other seborrheic keratosis: Secondary | ICD-10-CM | POA: Diagnosis not present

## 2012-01-26 ENCOUNTER — Telehealth: Payer: Self-pay | Admitting: Internal Medicine

## 2012-01-26 NOTE — Telephone Encounter (Signed)
Pt. Called would like to change from [facility] to [facility] Please review and advise if this is acceptable for both of you and I can call patient back to schedule her physical CB# [phone]

## 2012-01-26 NOTE — Telephone Encounter (Signed)
Ok with me 

## 2012-01-28 ENCOUNTER — Other Ambulatory Visit: Payer: Self-pay | Admitting: Family Medicine

## 2012-01-28 NOTE — Telephone Encounter (Signed)
msg left to call the office     KP 

## 2012-01-28 NOTE — Telephone Encounter (Signed)
Who was writing it?  I;ll refill it x1 but we normally don't want pt taking sleeping pills every day and I know Hopp did not write it.

## 2012-01-28 NOTE — Telephone Encounter (Signed)
spoke to dr hopp. verball 8.15.13 he approved

## 2012-01-28 NOTE — Telephone Encounter (Signed)
please advise     KP 

## 2012-01-28 NOTE — Telephone Encounter (Signed)
Called pt back after getting hoppers approval verbally to switch to [facility], made CPE for 10.10.13 at 10:30am which was 1st available. Pt., wants to know if [facility] will refill a medication that is on her meds list but has never been prescribed here   Medication copied from meds list  TRANXENE 7.5 MG Take 7.5 mg by mouth. 1/2 -1 by mouth at night  Pt uses pharmacy: [id number] - [place], [place] - [address] AT [facility] OF [address] & [facility] [address]  CB# [phone]

## 2012-01-29 NOTE — Telephone Encounter (Signed)
She had been going to Dr.Lewitt the headache specialist for the headaches and he had been prescribing the medication. Please advise     KP

## 2012-01-30 ENCOUNTER — Encounter (HOSPITAL_COMMUNITY): Payer: Self-pay | Admitting: *Deleted

## 2012-01-30 ENCOUNTER — Emergency Department (HOSPITAL_COMMUNITY)
Admission: EM | Admit: 2012-01-30 | Discharge: 2012-01-31 | Disposition: A | Discharge status: Home / Self Care | Payer: Medicare Other | Attending: Emergency Medicine | Admitting: Emergency Medicine

## 2012-01-30 DIAGNOSIS — W268XXA Contact with other sharp object(s), not elsewhere classified, initial encounter: Secondary | ICD-10-CM | POA: Insufficient documentation

## 2012-01-30 DIAGNOSIS — S91309A Unspecified open wound, unspecified foot, initial encounter: Secondary | ICD-10-CM | POA: Insufficient documentation

## 2012-01-30 DIAGNOSIS — IMO0002 Reserved for concepts with insufficient information to code with codable children: Secondary | ICD-10-CM

## 2012-01-30 NOTE — Telephone Encounter (Signed)
Refill x1 and we need records from Dr Vela Prose

## 2012-01-30 NOTE — ED Notes (Signed)
Pt states @ 2000 she was attempting to break mirror, bottom of mirror fell onto L foot, laceration near L 5th toe. Foot bandaged in triage. Bandages not removed at this time d/t reports of bleeding. Will wait for provider.

## 2012-01-30 NOTE — ED Notes (Signed)
Pt presents w/ laceration to L lateral distal foot. Bleeding controlled w/ pressure bandage.

## 2012-01-31 DIAGNOSIS — S91309A Unspecified open wound, unspecified foot, initial encounter: Secondary | ICD-10-CM | POA: Diagnosis not present

## 2012-01-31 MED ORDER — LIDOCAINE-EPINEPHRINE 2 %-1:100000 IJ SOLN
INTRAMUSCULAR | Status: AC
  Administered 2012-01-31: 01:00:00
  Filled 2012-01-31: qty 1

## 2012-01-31 MED ORDER — HYDROCODONE-ACETAMINOPHEN 5-325 MG PO TABS: 1.0000 | ORAL_TABLET | Freq: Four times a day (QID) | ORAL | Status: AC | PRN

## 2012-01-31 MED ORDER — TETANUS-DIPHTH-ACELL PERTUSSIS 5-2.5-18.5 LF-MCG/0.5 IM SUSP
0.5000 mL | Freq: Once | INTRAMUSCULAR | Status: AC
  Administered 2012-01-31: 0.5 mL via INTRAMUSCULAR
  Filled 2012-01-31: qty 0.5

## 2012-01-31 NOTE — ED Provider Notes (Signed)
History     CSN: [id number]  Arrival date & time 01/30/12  2049   First MD Initiated Contact with Patient 01/31/12 0000      Chief Complaint  Patient presents with  . Extremity Laceration    (Consider location/radiation/quality/duration/timing/severity/associated sxs/prior treatment) Patient is a 66 y.o. female presenting with skin laceration. The history is provided by the patient.  Laceration  The incident occurred 1 to 2 hours ago. The laceration is located on the left foot. The laceration is 1 cm in size. The laceration mechanism was a broken glass. The pain is at a severity of 6/10. The pain is mild. The pain has been constant since onset. She reports no foreign bodies present. Her tetanus status is out of date.    Past Medical History  Diagnosis Date  . Migraines   . Hyperlipidemia   . Vitamin d deficiency     Past Surgical History  Procedure Date  . Tubal ligation   . Tonsillectomy     Family History  Problem Relation Age of Onset  . Heart attack Mother   . Stroke Mother   . Stroke Father   . Heart disease Father     History  Substance Use Topics  . Smoking status: Never Smoker   . Smokeless tobacco: Not on file  . Alcohol Use: No    OB History    Grav Para Term Preterm Abortions TAB SAB Ect Mult Living                  Review of Systems  Constitutional: Negative for fever, diaphoresis and activity change.  HENT: Negative for congestion and neck pain.   Respiratory: Negative for cough.   Genitourinary: Negative for dysuria.  Musculoskeletal: Negative for myalgias.  Skin: Positive for wound. Negative for color change.  Neurological: Negative for headaches.  All other systems reviewed and are negative.    Allergies  Codeine  Home Medications   Current Outpatient Rx  Name Route Sig Dispense Refill  . CETIRIZINE HCL 10 MG PO TABS Oral Take 10 mg by mouth daily.    Marland Kitchen CLORAZEPATE DIPOTASSIUM 7.5 MG PO TABS Oral Take 3.75 mg by mouth at  bedtime. 1/2 -1 by mouth at night    . HYDROCODONE-ACETAMINOPHEN 5-325 MG PO TABS Oral Take 1 tablet by mouth every 6 (six) hours as needed for pain. 15 tablet 0    BP 136/61  Pulse 75  Temp 98.2 F (36.8 C) (Oral)  Resp 16  Ht 5\' 2"  (1.575 m)  Wt 149 lb 6.4 oz (67.767 kg)  BMI 27.33 kg/m2  SpO2 97%  Physical Exam  Nursing note and vitals reviewed. Constitutional: She is oriented to person, place, and time. She appears well-developed and well-nourished. No distress.  HENT:  Head: Normocephalic and atraumatic.  Eyes: Conjunctivae and EOM are normal.  Neck: Normal range of motion.  Pulmonary/Chest: Effort normal.  Musculoskeletal: Normal range of motion.  Neurological: She is alert and oriented to person, place, and time.  Skin: Skin is warm and dry. No rash noted. She is not diaphoretic.       1 cm laceration flap, superficial w bleeding contained. Location just proximal to 5th digit, lateral side of left foot.   Psychiatric: She has a normal mood and affect. Her behavior is normal.    ED Course  Procedures (including critical care time)  Labs Reviewed - No data to display No results found. LACERATION REPAIR Performed by: Jaci Carrel Authorized by: Drue Novel,  Freddie Nghiem Consent: Verbal consent obtained. Risks and benefits: risks, benefits and alternatives were discussed Consent given by: patient Patient identity confirmed: provided demographic data Prepped and Draped in normal sterile fashion Wound explored  Laceration Location: Lateral left foot just proximal to 5th digit  Laceration Length: 1cm  No Foreign Bodies seen or palpated  Anesthesia: local infiltration  Local anesthetic: lidocaine 2% w epinephrine  Anesthetic total: 2 ml  Irrigation method: syringe Amount of cleaning: standard  Skin closure: 4.0 prolene  Number of sutures: 3  Technique: simple interrupted   Patient tolerance: Patient tolerated the procedure well with no immediate  complications.   1. Laceration       MDM  Laceration   Tdap booster given.Pressure irrigation performed. Laceration occurred < 8 hours prior to repair which was well tolerated. Pt has no co morbidities to effect normal wound healing. Discussed suture home care w pt and answered questions. Pt to f-u for wound check and suture removal in 7-10 days. Pt is hemodynamically stable w no complaints prior to dc.           Jaci Carrel, [place] 01/31/12 [phone]

## 2012-02-01 MED ORDER — CLORAZEPATE DIPOTASSIUM 7.5 MG PO TABS: ORAL_TABLET | ORAL | Status: DC

## 2012-02-01 NOTE — ED Provider Notes (Signed)
Medical screening examination/treatment/procedure(s) were performed by non-physician practitioner and as supervising physician I was immediately available for consultation/collaboration.   Gavin Pound. Keevon Henney, MD 02/01/12 1551

## 2012-02-08 DIAGNOSIS — R3915 Urgency of urination: Secondary | ICD-10-CM | POA: Diagnosis not present

## 2012-02-08 DIAGNOSIS — R350 Frequency of micturition: Secondary | ICD-10-CM | POA: Diagnosis not present

## 2012-02-16 DIAGNOSIS — M5137 Other intervertebral disc degeneration, lumbosacral region: Secondary | ICD-10-CM | POA: Diagnosis not present

## 2012-02-22 DIAGNOSIS — M5137 Other intervertebral disc degeneration, lumbosacral region: Secondary | ICD-10-CM | POA: Diagnosis not present

## 2012-02-29 DIAGNOSIS — M5137 Other intervertebral disc degeneration, lumbosacral region: Secondary | ICD-10-CM | POA: Diagnosis not present

## 2012-03-15 DIAGNOSIS — H04129 Dry eye syndrome of unspecified lacrimal gland: Secondary | ICD-10-CM | POA: Diagnosis not present

## 2012-03-15 DIAGNOSIS — H2510 Age-related nuclear cataract, unspecified eye: Secondary | ICD-10-CM | POA: Diagnosis not present

## 2012-03-18 ENCOUNTER — Encounter: Payer: Self-pay | Admitting: Gastroenterology

## 2012-03-24 ENCOUNTER — Encounter: Payer: Medicare Other | Admitting: Family Medicine

## 2012-04-05 DIAGNOSIS — R5381 Other malaise: Secondary | ICD-10-CM | POA: Diagnosis not present

## 2012-04-05 DIAGNOSIS — R5383 Other fatigue: Secondary | ICD-10-CM | POA: Diagnosis not present

## 2012-05-03 DIAGNOSIS — R5381 Other malaise: Secondary | ICD-10-CM | POA: Diagnosis not present

## 2012-05-03 DIAGNOSIS — R5383 Other fatigue: Secondary | ICD-10-CM | POA: Diagnosis not present

## 2012-05-06 DIAGNOSIS — L989 Disorder of the skin and subcutaneous tissue, unspecified: Secondary | ICD-10-CM | POA: Diagnosis not present

## 2012-05-23 ENCOUNTER — Encounter: Payer: Self-pay | Admitting: Internal Medicine

## 2012-05-23 ENCOUNTER — Ambulatory Visit (INDEPENDENT_AMBULATORY_CARE_PROVIDER_SITE_OTHER): Payer: Medicare Other | Admitting: Internal Medicine

## 2012-05-23 VITALS — BP 140/88 | HR 64 | Temp 97.4°F

## 2012-05-23 DIAGNOSIS — J019 Acute sinusitis, unspecified: Secondary | ICD-10-CM

## 2012-05-23 MED ORDER — AMOXICILLIN-POT CLAVULANATE 875-125 MG PO TABS: 1.0000 | ORAL_TABLET | Freq: Two times a day (BID) | ORAL | Status: DC

## 2012-05-23 MED ORDER — FLUTICASONE PROPIONATE 50 MCG/ACT NA SUSP: 2.0000 | Freq: Every day | NASAL | Status: DC

## 2012-05-23 NOTE — Progress Notes (Signed)
HPI  Pt presents to the clinic today with c/o sinus pain and pressure. This has been going on for 10 days. She has been using some left over flonase and a neti pot which is not helping. She also c/o a constant headache 5/10. She has taken excedrin migraine without relief. She does get prone to sinus infections and is sure that is what is going on. She denies fever, chills, nausea or vomiting. She denies haing sick contacts.  Review of Systems    Past Medical History  Diagnosis Date  . Migraines   . Hyperlipidemia   . Vitamin D deficiency     Family History  Problem Relation Age of Onset  . Heart attack Mother   . Stroke Mother   . Stroke Father   . Heart disease Father     History   Social History  . Marital Status: Widowed    Spouse Name: N/A    Number of Children: N/A  . Years of Education: N/A   Occupational History  . Not on file.   Social History Main Topics  . Smoking status: Never Smoker   . Smokeless tobacco: Not on file  . Alcohol Use: No  . Drug Use: No  . Sexually Active: Not on file   Other Topics Concern  . Not on file   Social History Narrative  . No narrative on file    Allergies  Allergen Reactions  . Codeine     REACTION: sick on stomach     Constitutional: Positive headache, fatigue and fever. Denies abrupt weight changes.  HEENT:  Positive eye pain, pressure behind the eyes, facial pain, nasal congestion and sore throat. Denies eye redness, ear pain, ringing in the ears, wax buildup, runny nose or bloody nose. Respiratory: Positive cough and thick green sputum production. Denies difficulty breathing or shortness of breath.  Cardiovascular: Denies chest pain, chest tightness, palpitations or swelling in the hands or feet.   No other specific complaints in a complete review of systems (except as listed in HPI above).  Objective:    General: Appears her stated age, well developed, well nourished in NAD. HEENT: Head: normal shape and  size; Eyes: sclera white, no icterus, conjunctiva pink, PERRLA and EOMs intact; Ears: Tm's gray and intact, normal light reflex; Nose: mucosa pink and moist, septum midline; Throat/Mouth: + PND. Teeth present, mucosa pink and moist, no exudate noted, no lesions or ulcerations noted.  Neck: Mild cervical lymphadenopathy. Neck supple, trachea midline. No massses, lumps or thyromegaly present.  Cardiovascular: Normal rate and rhythm. S1,S2 noted.  No murmur, rubs or gallops noted. No JVD or BLE edema. No carotid bruits noted. Pulmonary/Chest: Normal effort and positive vesicular breath sounds. No respiratory distress. No wheezes, rales or ronchi noted.      Assessment & Plan:   Acute bacterial sinusitis  Can use a Neti Pot which can be purchased from your local drug store. Flonase 2 sprays each nostril for 3 days and then as needed. Augmentin BID for 10 days  RTC as needed or if symptoms persist.

## 2012-05-23 NOTE — Patient Instructions (Signed)

## 2012-06-02 DIAGNOSIS — R5383 Other fatigue: Secondary | ICD-10-CM | POA: Diagnosis not present

## 2012-06-02 DIAGNOSIS — R5381 Other malaise: Secondary | ICD-10-CM | POA: Diagnosis not present

## 2012-06-13 DIAGNOSIS — R5381 Other malaise: Secondary | ICD-10-CM | POA: Diagnosis not present

## 2012-06-20 DIAGNOSIS — R5383 Other fatigue: Secondary | ICD-10-CM | POA: Diagnosis not present

## 2012-06-20 DIAGNOSIS — R5381 Other malaise: Secondary | ICD-10-CM | POA: Diagnosis not present

## 2012-06-23 ENCOUNTER — Ambulatory Visit (INDEPENDENT_AMBULATORY_CARE_PROVIDER_SITE_OTHER): Payer: Medicare Other | Admitting: Family Medicine

## 2012-06-23 ENCOUNTER — Encounter: Payer: Self-pay | Admitting: Family Medicine

## 2012-06-23 VITALS — BP 124/76 | HR 60 | Temp 97.8°F | Ht 62.5 in | Wt 146.4 lb

## 2012-06-23 DIAGNOSIS — Z1239 Encounter for other screening for malignant neoplasm of breast: Secondary | ICD-10-CM

## 2012-06-23 DIAGNOSIS — M899 Disorder of bone, unspecified: Secondary | ICD-10-CM | POA: Diagnosis not present

## 2012-06-23 DIAGNOSIS — Z136 Encounter for screening for cardiovascular disorders: Secondary | ICD-10-CM

## 2012-06-23 DIAGNOSIS — Z0000 Encounter for general adult medical examination without abnormal findings: Secondary | ICD-10-CM | POA: Diagnosis not present

## 2012-06-23 DIAGNOSIS — Z780 Asymptomatic menopausal state: Secondary | ICD-10-CM

## 2012-06-23 DIAGNOSIS — Z1231 Encounter for screening mammogram for malignant neoplasm of breast: Secondary | ICD-10-CM

## 2012-06-23 DIAGNOSIS — G43909 Migraine, unspecified, not intractable, without status migrainosus: Secondary | ICD-10-CM

## 2012-06-23 DIAGNOSIS — Z139 Encounter for screening, unspecified: Secondary | ICD-10-CM | POA: Diagnosis not present

## 2012-06-23 DIAGNOSIS — M8580 Other specified disorders of bone density and structure, unspecified site: Secondary | ICD-10-CM

## 2012-06-23 DIAGNOSIS — M949 Disorder of cartilage, unspecified: Secondary | ICD-10-CM | POA: Diagnosis not present

## 2012-06-23 LAB — CBC WITH DIFFERENTIAL/PLATELET
Basophils Relative: 0.9 % (ref 0.0–3.0)
Eosinophils Relative: 2.7 % (ref 0.0–5.0)
HCT: 41.3 % (ref 36.0–46.0)
Hemoglobin: 13.8 g/dL (ref 12.0–15.0)
Lymphs Abs: 1.5 10*3/uL (ref 0.7–4.0)
Monocytes Relative: 6.2 % (ref 3.0–12.0)
Neutro Abs: 3.4 10*3/uL (ref 1.4–7.7)
WBC: 5.5 10*3/uL (ref 4.5–10.5)

## 2012-06-23 LAB — LDL CHOLESTEROL, DIRECT: Direct LDL: 123.4 mg/dL

## 2012-06-23 LAB — LIPID PANEL
Cholesterol: 226 mg/dL — ABNORMAL HIGH (ref 0–200)
Total CHOL/HDL Ratio: 3
Triglycerides: 82 mg/dL (ref 0.0–149.0)

## 2012-06-23 LAB — BASIC METABOLIC PANEL
GFR: 64.82 mL/min (ref >60.00)
Glucose, Bld: 90 mg/dL (ref 70–99)
Potassium: 4.3 mEq/L (ref 3.5–5.1)
Sodium: 138 mEq/L (ref 135–145)

## 2012-06-23 LAB — HEPATIC FUNCTION PANEL
ALT: 18 U/L (ref 0–35)
AST: 28 U/L (ref 0–37)
Total Bilirubin: 1.3 mg/dL — ABNORMAL HIGH (ref 0.3–1.2)
Total Protein: 7.4 g/dL (ref 6.0–8.3)

## 2012-06-23 MED ORDER — NARATRIPTAN HCL 2.5 MG PO TABS: 2.5000 mg | ORAL_TABLET | ORAL | Status: DC | PRN

## 2012-06-23 NOTE — Patient Instructions (Addendum)
Preventive Care for Adults, Female A healthy lifestyle and preventive care can promote health and wellness. Preventive health guidelines for women include the following key practices.  A routine yearly physical is a good way to check with your caregiver about your health and preventive screening. It is a chance to share any concerns and updates on your health, and to receive a thorough exam.  Visit your dentist for a routine exam and preventive care every 6 months. Brush your teeth twice a day and floss once a day. Good oral hygiene prevents tooth decay and gum disease.  The frequency of eye exams is based on your age, health, family medical history, use of contact lenses, and other factors. Follow your caregiver's recommendations for frequency of eye exams.  Eat a healthy diet. Foods like vegetables, fruits, whole grains, low-fat dairy products, and lean protein foods contain the nutrients you need without too many calories. Decrease your intake of foods high in solid fats, added sugars, and salt. Eat the right amount of calories for you.Get information about a proper diet from your caregiver, if necessary.  Regular physical exercise is one of the most important things you can do for your health. Most adults should get at least 150 minutes of moderate-intensity exercise (any activity that increases your heart rate and causes you to sweat) each week. In addition, most adults need muscle-strengthening exercises on 2 or more days a week.  Maintain a healthy weight. The body mass index (BMI) is a screening tool to identify possible weight problems. It provides an estimate of body fat based on height and weight. Your caregiver can help determine your BMI, and can help you achieve or maintain a healthy weight.For adults 20 years and older:  A BMI below 18.5 is considered underweight.  A BMI of 18.5 to 24.9 is normal.  A BMI of 25 to 29.9 is considered overweight.  A BMI of 30 and above is  considered obese.  Maintain normal blood lipids and cholesterol levels by exercising and minimizing your intake of saturated fat. Eat a balanced diet with plenty of fruit and vegetables. Blood tests for lipids and cholesterol should begin at age 20 and be repeated every 5 years. If your lipid or cholesterol levels are high, you are over 50, or you are at high risk for heart disease, you may need your cholesterol levels checked more frequently.Ongoing high lipid and cholesterol levels should be treated with medicines if diet and exercise are not effective.  If you smoke, find out from your caregiver how to quit. If you do not use tobacco, do not start.  If you are pregnant, do not drink alcohol. If you are breastfeeding, be very cautious about drinking alcohol. If you are not pregnant and choose to drink alcohol, do not exceed 1 drink per day. One drink is considered to be 12 ounces (355 mL) of beer, 5 ounces (148 mL) of wine, or 1.5 ounces (44 mL) of liquor.  Avoid use of street drugs. Do not share needles with anyone. Ask for help if you need support or instructions about stopping the use of drugs.  High blood pressure causes heart disease and increases the risk of stroke. Your blood pressure should be checked at least every 1 to 2 years. Ongoing high blood pressure should be treated with medicines if weight loss and exercise are not effective.  If you are 55 to 67 years old, ask your caregiver if you should take aspirin to prevent strokes.  Diabetes   screening involves taking a blood sample to check your fasting blood sugar level. This should be done once every 3 years, after age 45, if you are within normal weight and without risk factors for diabetes. Testing should be considered at a younger age or be carried out more frequently if you are overweight and have at least 1 risk factor for diabetes.  Breast cancer screening is essential preventive care for women. You should practice "breast  self-awareness." This means understanding the normal appearance and feel of your breasts and may include breast self-examination. Any changes detected, no matter how small, should be reported to a caregiver. Women in their 20s and 30s should have a clinical breast exam (CBE) by a caregiver as part of a regular health exam every 1 to 3 years. After age 40, women should have a CBE every year. Starting at age 40, women should consider having a mammography (breast X-ray test) every year. Women who have a family history of breast cancer should talk to their caregiver about genetic screening. Women at a high risk of breast cancer should talk to their caregivers about having magnetic resonance imaging (MRI) and a mammography every year.  The Pap test is a screening test for cervical cancer. A Pap test can show cell changes on the cervix that might become cervical cancer if left untreated. A Pap test is a procedure in which cells are obtained and examined from the lower end of the uterus (cervix).  Women should have a Pap test starting at age 21.  Between ages 21 and 29, Pap tests should be repeated every 2 years.  Beginning at age 30, you should have a Pap test every 3 years as long as the past 3 Pap tests have been normal.  Some women have medical problems that increase the chance of getting cervical cancer. Talk to your caregiver about these problems. It is especially important to talk to your caregiver if a new problem develops soon after your last Pap test. In these cases, your caregiver may recommend more frequent screening and Pap tests.  The above recommendations are the same for women who have or have not gotten the vaccine for human papillomavirus (HPV).  If you had a hysterectomy for a problem that was not cancer or a condition that could lead to cancer, then you no longer need Pap tests. Even if you no longer need a Pap test, a regular exam is a good idea to make sure no other problems are  starting.  If you are between ages 65 and 70, and you have had normal Pap tests going back 10 years, you no longer need Pap tests. Even if you no longer need a Pap test, a regular exam is a good idea to make sure no other problems are starting.  If you have had past treatment for cervical cancer or a condition that could lead to cancer, you need Pap tests and screening for cancer for at least 20 years after your treatment.  If Pap tests have been discontinued, risk factors (such as a new sexual partner) need to be reassessed to determine if screening should be resumed.  The HPV test is an additional test that may be used for cervical cancer screening. The HPV test looks for the virus that can cause the cell changes on the cervix. The cells collected during the Pap test can be tested for HPV. The HPV test could be used to screen women aged 30 years and older, and should   be used in women of any age who have unclear Pap test results. After the age of 30, women should have HPV testing at the same frequency as a Pap test.  Colorectal cancer can be detected and often prevented. Most routine colorectal cancer screening begins at the age of 50 and continues through age 75. However, your caregiver may recommend screening at an earlier age if you have risk factors for colon cancer. On a yearly basis, your caregiver may provide home test kits to check for hidden blood in the stool. Use of a small camera at the end of a tube, to directly examine the colon (sigmoidoscopy or colonoscopy), can detect the earliest forms of colorectal cancer. Talk to your caregiver about this at age 50, when routine screening begins. Direct examination of the colon should be repeated every 5 to 10 years through age 75, unless early forms of pre-cancerous polyps or small growths are found.  Hepatitis C blood testing is recommended for all people born from 1945 through 1965 and any individual with known risks for hepatitis C.  Practice  safe sex. Use condoms and avoid high-risk sexual practices to reduce the spread of sexually transmitted infections (STIs). STIs include gonorrhea, chlamydia, syphilis, trichomonas, herpes, HPV, and human immunodeficiency virus (HIV). Herpes, HIV, and HPV are viral illnesses that have no cure. They can result in disability, cancer, and death. Sexually active women aged 25 and younger should be checked for chlamydia. Older women with new or multiple partners should also be tested for chlamydia. Testing for other STIs is recommended if you are sexually active and at increased risk.  Osteoporosis is a disease in which the bones lose minerals and strength with aging. This can result in serious bone fractures. The risk of osteoporosis can be identified using a bone density scan. Women ages 65 and over and women at risk for fractures or osteoporosis should discuss screening with their caregivers. Ask your caregiver whether you should take a calcium supplement or vitamin D to reduce the rate of osteoporosis.  Menopause can be associated with physical symptoms and risks. Hormone replacement therapy is available to decrease symptoms and risks. You should talk to your caregiver about whether hormone replacement therapy is right for you.  Use sunscreen with sun protection factor (SPF) of 30 or more. Apply sunscreen liberally and repeatedly throughout the day. You should seek shade when your shadow is shorter than you. Protect yourself by wearing long sleeves, pants, a wide-brimmed hat, and sunglasses year round, whenever you are outdoors.  Once a month, do a whole body skin exam, using a mirror to look at the skin on your back. Notify your caregiver of new moles, moles that have irregular borders, moles that are larger than a pencil eraser, or moles that have changed in shape or color.  Stay current with required immunizations.  Influenza. You need a dose every fall (or winter). The composition of the flu vaccine  changes each year, so being vaccinated once is not enough.  Pneumococcal polysaccharide. You need 1 to 2 doses if you smoke cigarettes or if you have certain chronic medical conditions. You need 1 dose at age 65 (or older) if you have never been vaccinated.  Tetanus, diphtheria, pertussis (Tdap, Td). Get 1 dose of Tdap vaccine if you are younger than age 65, are over 65 and have contact with an infant, are a healthcare worker, are pregnant, or simply want to be protected from whooping cough. After that, you need a Td   booster dose every 10 years. Consult your caregiver if you have not had at least 3 tetanus and diphtheria-containing shots sometime in your life or have a deep or dirty wound.  HPV. You need this vaccine if you are a woman age 26 or younger. The vaccine is given in 3 doses over 6 months.  Measles, mumps, rubella (MMR). You need at least 1 dose of MMR if you were born in 1957 or later. You may also need a second dose.  Meningococcal. If you are age 19 to 21 and a first-year college student living in a residence hall, or have one of several medical conditions, you need to get vaccinated against meningococcal disease. You may also need additional booster doses.  Zoster (shingles). If you are age 60 or older, you should get this vaccine.  Varicella (chickenpox). If you have never had chickenpox or you were vaccinated but received only 1 dose, talk to your caregiver to find out if you need this vaccine.  Hepatitis A. You need this vaccine if you have a specific risk factor for hepatitis A virus infection or you simply wish to be protected from this disease. The vaccine is usually given as 2 doses, 6 to 18 months apart.  Hepatitis B. You need this vaccine if you have a specific risk factor for hepatitis B virus infection or you simply wish to be protected from this disease. The vaccine is given in 3 doses, usually over 6 months. Preventive Services / Frequency Ages 19 to 39  Blood  pressure check.** / Every 1 to 2 years.  Lipid and cholesterol check.** / Every 5 years beginning at age 20.  Clinical breast exam.** / Every 3 years for women in their 20s and 30s.  Pap test.** / Every 2 years from ages 21 through 29. Every 3 years starting at age 30 through age 65 or 70 with a history of 3 consecutive normal Pap tests.  HPV screening.** / Every 3 years from ages 30 through ages 65 to 70 with a history of 3 consecutive normal Pap tests.  Hepatitis C blood test.** / For any individual with known risks for hepatitis C.  Skin self-exam. / Monthly.  Influenza immunization.** / Every year.  Pneumococcal polysaccharide immunization.** / 1 to 2 doses if you smoke cigarettes or if you have certain chronic medical conditions.  Tetanus, diphtheria, pertussis (Tdap, Td) immunization. / A one-time dose of Tdap vaccine. After that, you need a Td booster dose every 10 years.  HPV immunization. / 3 doses over 6 months, if you are 26 and younger.  Measles, mumps, rubella (MMR) immunization. / You need at least 1 dose of MMR if you were born in 1957 or later. You may also need a second dose.  Meningococcal immunization. / 1 dose if you are age 19 to 21 and a first-year college student living in a residence hall, or have one of several medical conditions, you need to get vaccinated against meningococcal disease. You may also need additional booster doses.  Varicella immunization.** / Consult your caregiver.  Hepatitis A immunization.** / Consult your caregiver. 2 doses, 6 to 18 months apart.  Hepatitis B immunization.** / Consult your caregiver. 3 doses usually over 6 months. Ages 40 to 64  Blood pressure check.** / Every 1 to 2 years.  Lipid and cholesterol check.** / Every 5 years beginning at age 20.  Clinical breast exam.** / Every year after age 40.  Mammogram.** / Every year beginning at age 40   and continuing for as long as you are in good health. Consult with your  caregiver.  Pap test.** / Every 3 years starting at age 30 through age 65 or 70 with a history of 3 consecutive normal Pap tests.  HPV screening.** / Every 3 years from ages 30 through ages 65 to 70 with a history of 3 consecutive normal Pap tests.  Fecal occult blood test (FOBT) of stool. / Every year beginning at age 50 and continuing until age 75. You may not need to do this test if you get a colonoscopy every 10 years.  Flexible sigmoidoscopy or colonoscopy.** / Every 5 years for a flexible sigmoidoscopy or every 10 years for a colonoscopy beginning at age 50 and continuing until age 75.  Hepatitis C blood test.** / For all people born from 1945 through 1965 and any individual with known risks for hepatitis C.  Skin self-exam. / Monthly.  Influenza immunization.** / Every year.  Pneumococcal polysaccharide immunization.** / 1 to 2 doses if you smoke cigarettes or if you have certain chronic medical conditions.  Tetanus, diphtheria, pertussis (Tdap, Td) immunization.** / A one-time dose of Tdap vaccine. After that, you need a Td booster dose every 10 years.  Measles, mumps, rubella (MMR) immunization. / You need at least 1 dose of MMR if you were born in 1957 or later. You may also need a second dose.  Varicella immunization.** / Consult your caregiver.  Meningococcal immunization.** / Consult your caregiver.  Hepatitis A immunization.** / Consult your caregiver. 2 doses, 6 to 18 months apart.  Hepatitis B immunization.** / Consult your caregiver. 3 doses, usually over 6 months. Ages 65 and over  Blood pressure check.** / Every 1 to 2 years.  Lipid and cholesterol check.** / Every 5 years beginning at age 20.  Clinical breast exam.** / Every year after age 40.  Mammogram.** / Every year beginning at age 40 and continuing for as long as you are in good health. Consult with your caregiver.  Pap test.** / Every 3 years starting at age 30 through age 65 or 70 with a 3  consecutive normal Pap tests. Testing can be stopped between 65 and 70 with 3 consecutive normal Pap tests and no abnormal Pap or HPV tests in the past 10 years.  HPV screening.** / Every 3 years from ages 30 through ages 65 or 70 with a history of 3 consecutive normal Pap tests. Testing can be stopped between 65 and 70 with 3 consecutive normal Pap tests and no abnormal Pap or HPV tests in the past 10 years.  Fecal occult blood test (FOBT) of stool. / Every year beginning at age 50 and continuing until age 75. You may not need to do this test if you get a colonoscopy every 10 years.  Flexible sigmoidoscopy or colonoscopy.** / Every 5 years for a flexible sigmoidoscopy or every 10 years for a colonoscopy beginning at age 50 and continuing until age 75.  Hepatitis C blood test.** / For all people born from 1945 through 1965 and any individual with known risks for hepatitis C.  Osteoporosis screening.** / A one-time screening for women ages 65 and over and women at risk for fractures or osteoporosis.  Skin self-exam. / Monthly.  Influenza immunization.** / Every year.  Pneumococcal polysaccharide immunization.** / 1 dose at age 65 (or older) if you have never been vaccinated.  Tetanus, diphtheria, pertussis (Tdap, Td) immunization. / A one-time dose of Tdap vaccine if you are over   65 and have contact with an infant, are a healthcare worker, or simply want to be protected from whooping cough. After that, you need a Td booster dose every 10 years.  Varicella immunization.** / Consult your caregiver.  Meningococcal immunization.** / Consult your caregiver.  Hepatitis A immunization.** / Consult your caregiver. 2 doses, 6 to 18 months apart.  Hepatitis B immunization.** / Check with your caregiver. 3 doses, usually over 6 months. ** Family history and personal history of risk and conditions may change your caregiver's recommendations. Document Released: 07/28/2001 Document Revised: 08/24/2011  Document Reviewed: 10/27/2010 [employer] Patient Information 2013 [employer].  

## 2012-06-23 NOTE — Progress Notes (Signed)
Subjective:    Kylie Wheeler is a 67 y.o. female who presents for a welcome to Medicare exam.   Cardiac risk factors: advanced age (older than 80 for men, 53 for women).  Activities of Daily Living  In your present state of health, do you have any difficulty performing the following activities?:  Preparing food and eating?: No Bathing yourself: No Getting dressed: No Using the toilet:No Moving around from place to place: No In the past year have you fallen or had a near fall?:No  Current exercise habits: Gym/ health club routine includes cardio.   Dietary issues discussed: na   Depression Screen (Note: if answer to either of the following is "Yes", then a more complete depression screening is indicated)  Q1: Over the past two weeks, have you felt down, depressed or hopeless?no Q2: Over the past two weeks, have you felt little interest or pleasure in doing things? no   The following portions of the patient's history were reviewed and updated as appropriate: allergies, current medications, past family history, past medical history, past social history, past surgical history and problem list. Review of Systems  Review of Systems  Constitutional: Negative for activity change, appetite change and fatigue.  HENT: Negative for hearing loss, congestion, tinnitus and ear discharge.   Eyes: Negative for visual disturbance (see optho q1y -- vision corrected to 20/20 with glasses).  Respiratory: Negative for cough, chest tightness and shortness of breath.   Cardiovascular: Negative for chest pain, palpitations and leg swelling.  Gastrointestinal: Negative for abdominal pain, diarrhea, constipation and abdominal distention.  Genitourinary: Negative for urgency, frequency, decreased urine volume and difficulty urinating.  Musculoskeletal: Negative for back pain, arthralgias and gait problem.  Skin: Negative for color change, pallor and rash.  Neurological: Negative for dizziness,  light-headedness, numbness and headaches.  Hematological: Negative for adenopathy. Does not bruise/bleed easily.  Psychiatric/Behavioral: Negative for suicidal ideas, confusion, sleep disturbance, self-injury, dysphoric mood, decreased concentration and agitation.  Pt is able to read and write and can do all ADLs No risk for falling No abuse/ violence in home    ekg done--normal Objective:     Vision by Snellen chart: opth Blood pressure 124/76, pulse 60, temperature 97.8 F (36.6 C), temperature source Oral, height 5' 2.5" (1.588 m), weight 146 lb 6.4 oz (66.407 kg), SpO2 97.00%. Body mass index is 26.35 kg/(m^2). BP 124/76  Pulse 60  Temp 97.8 F (36.6 C) (Oral)  Ht 5' 2.5" (1.588 m)  Wt 146 lb 6.4 oz (66.407 kg)  BMI 26.35 kg/m2  SpO2 97% General appearance: alert, cooperative, appears stated age and no distress Head: Normocephalic, without obvious abnormality, atraumatic Eyes: conjunctivae/corneas clear. PERRL, EOM's intact. Fundi benign. Ears: normal TM's and external ear canals both ears Nose: Nares normal. Septum midline. Mucosa normal. No drainage or sinus tenderness. Throat: lips, mucosa, and tongue normal; teeth and gums normal Neck: no adenopathy, no carotid bruit, no JVD, supple, symmetrical, trachea midline and thyroid not enlarged, symmetric, no tenderness/mass/nodules Back: + scoliosis Lungs: clear to auscultation bilaterally Breasts: gyn Heart: regular rate and rhythm, S1, S2 normal, no murmur, click, rub or gallop Abdomen: soft, non-tender; bowel sounds normal; no masses,  no organomegaly Pelvic: deferred--gyn Extremities: extremities normal, atraumatic, no cyanosis or edema Pulses: 2+ and symmetric Skin: Skin color, texture, turgor normal. No rashes or lesions Lymph nodes: Cervical, supraclavicular, and axillary nodes normal. Neurologic: Alert and oriented X 3, normal strength and tone. Normal symmetric reflexes. Normal coordination and gait psych-- no  depression, anxiety    Assessment:    cpe     Plan:     During the course of the visit the patient was educated and counseled about appropriate screening and preventive services including:   Pneumococcal vaccine   Influenza vaccine  Screening electrocardiogram  Screening mammography  Screening Pap smear and pelvic exam   Bone densitometry screening  Colorectal cancer screening  Glaucoma screening  Advanced directives: has an advanced directive - a copy HAS NOT been provided.  Patient Instructions (the written plan) was given to the patient.

## 2012-06-24 LAB — POCT URINALYSIS DIPSTICK
Bilirubin, UA: NEGATIVE
Blood, UA: NEGATIVE
Glucose, UA: NEGATIVE
Ketones, UA: NEGATIVE
Protein, UA: NEGATIVE
Urobilinogen, UA: 0.2
pH, UA: 6

## 2012-06-27 ENCOUNTER — Encounter: Payer: Self-pay | Admitting: Family Medicine

## 2012-06-29 DIAGNOSIS — R5381 Other malaise: Secondary | ICD-10-CM | POA: Diagnosis not present

## 2012-06-29 DIAGNOSIS — R5383 Other fatigue: Secondary | ICD-10-CM | POA: Diagnosis not present

## 2012-07-28 ENCOUNTER — Other Ambulatory Visit: Payer: Medicare Other

## 2012-07-28 ENCOUNTER — Ambulatory Visit: Payer: Medicare Other

## 2012-08-03 ENCOUNTER — Other Ambulatory Visit: Payer: Self-pay | Admitting: Family Medicine

## 2012-08-04 NOTE — Telephone Encounter (Signed)
Last seen 06/23/12 and filled 02/01/12 #30. Please advise      KP

## 2012-08-18 ENCOUNTER — Other Ambulatory Visit: Payer: Medicare Other

## 2012-08-18 ENCOUNTER — Ambulatory Visit: Payer: Medicare Other

## 2012-08-18 ENCOUNTER — Encounter: Payer: Self-pay | Admitting: Family Medicine

## 2012-08-18 ENCOUNTER — Ambulatory Visit
Admission: RE | Admit: 2012-08-18 | Discharge: 2012-08-18 | Disposition: A | Discharge status: Home / Self Care | Payer: Medicare Other | Source: Ambulatory Visit | Attending: Family Medicine | Admitting: Family Medicine

## 2012-08-18 DIAGNOSIS — Z780 Asymptomatic menopausal state: Secondary | ICD-10-CM

## 2012-08-18 DIAGNOSIS — Z1231 Encounter for screening mammogram for malignant neoplasm of breast: Secondary | ICD-10-CM

## 2012-08-18 DIAGNOSIS — M810 Age-related osteoporosis without current pathological fracture: Secondary | ICD-10-CM | POA: Diagnosis not present

## 2012-08-18 DIAGNOSIS — M949 Disorder of cartilage, unspecified: Secondary | ICD-10-CM | POA: Diagnosis not present

## 2012-08-18 DIAGNOSIS — M899 Disorder of bone, unspecified: Secondary | ICD-10-CM | POA: Diagnosis not present

## 2012-08-18 DIAGNOSIS — M8580 Other specified disorders of bone density and structure, unspecified site: Secondary | ICD-10-CM

## 2012-08-22 MED ORDER — ALENDRONATE SODIUM 70 MG PO TABS: 70.0000 mg | ORAL_TABLET | ORAL | Status: DC

## 2012-08-23 ENCOUNTER — Other Ambulatory Visit: Payer: Self-pay | Admitting: Family Medicine

## 2012-08-23 DIAGNOSIS — R928 Other abnormal and inconclusive findings on diagnostic imaging of breast: Secondary | ICD-10-CM

## 2012-08-25 DIAGNOSIS — R5383 Other fatigue: Secondary | ICD-10-CM | POA: Diagnosis not present

## 2012-08-25 DIAGNOSIS — R5381 Other malaise: Secondary | ICD-10-CM | POA: Diagnosis not present

## 2012-09-02 ENCOUNTER — Ambulatory Visit
Admission: RE | Admit: 2012-09-02 | Discharge: 2012-09-02 | Disposition: A | Discharge status: Home / Self Care | Payer: Medicare Other | Source: Ambulatory Visit | Attending: Family Medicine | Admitting: Family Medicine

## 2012-09-02 DIAGNOSIS — R928 Other abnormal and inconclusive findings on diagnostic imaging of breast: Secondary | ICD-10-CM

## 2012-09-26 DIAGNOSIS — M79609 Pain in unspecified limb: Secondary | ICD-10-CM | POA: Diagnosis not present

## 2012-09-29 DIAGNOSIS — M79609 Pain in unspecified limb: Secondary | ICD-10-CM | POA: Diagnosis not present

## 2012-09-30 ENCOUNTER — Telehealth: Payer: Self-pay | Admitting: Family Medicine

## 2012-09-30 MED ORDER — CLORAZEPATE DIPOTASSIUM 7.5 MG PO TABS: ORAL_TABLET | ORAL | Status: DC

## 2012-09-30 NOTE — Telephone Encounter (Signed)
Please advise 

## 2012-09-30 NOTE — Telephone Encounter (Signed)
Patient Information:  Caller Name: Merrillyn  Phone: [phone]  Patient: Kylie Wheeler, Kylie Wheeler  Gender: Female  DOB: September 24, 1945  Age: 67 Years  PCP: Lelon Perla.  Office Follow Up:  Does the office need to follow up with this patient?: No  Instructions For The Office: N/A   Symptoms  Reason For Call & Symptoms: caller states she slid on hard wood floor and hit head right above ear.  there is a little swelling above right ear,  No blurry vision and no nausea.  Pain is 6/10.  Reviewed Health History In EMR: Yes  Reviewed Medications In EMR: Yes  Reviewed Allergies In EMR: Yes  Reviewed Surgeries / Procedures: Yes  Date of Onset of Symptoms: 09/29/2012  Treatments Tried: nortriptine  Treatments Tried Worked: No  Guideline(s) Used:  Head Injury  Disposition Per Guideline:   Home Care  Reason For Disposition Reached:   Minor head injury  Advice Given:  Apply a Cold Pack:  Apply a cold pack or an ice bag (wrapped in a moist towel) to the area for 20 minutes. Repeat in 1 hour, then every 4 hours while awake.  Continue this for the first 48 hours after an injury.  This will help decrease pain and swelling.  Apply Heat to the Area:  Beginning 48 hours after an injury, apply a warm washcloth or heating pad for 10 minutes three times a day.  This will help increase blood flow and improve healing.  Expected Course:  Most head trauma only causes an injury to the scalp. Pain, swelling, and bruising usually start to get better 2 to 3 days after an injury.  Swelling most often is gone after 1 week.  Call Back If:  Severe headache  Extremity weakness or numbness occurs  Slurred speech or blurred vision occurs  Vomiting occurs  You become worse.  Patient Will Follow Care Advice:  YES  (call came in on daytime scripting when office was closed; RN documented in EPIC, just so office was aware of triage)

## 2012-09-30 NOTE — Telephone Encounter (Signed)
Med filled and pt notified.  

## 2012-09-30 NOTE — Telephone Encounter (Signed)
If any symptoms develop-- go to [facility]/ er

## 2012-10-02 ENCOUNTER — Emergency Department (HOSPITAL_COMMUNITY)
Admission: EM | Admit: 2012-10-02 | Discharge: 2012-10-02 | Disposition: A | Discharge status: Home / Self Care | Payer: Medicare Other | Attending: Emergency Medicine | Admitting: Emergency Medicine

## 2012-10-02 ENCOUNTER — Encounter (HOSPITAL_COMMUNITY): Payer: Self-pay | Admitting: *Deleted

## 2012-10-02 ENCOUNTER — Emergency Department (INDEPENDENT_AMBULATORY_CARE_PROVIDER_SITE_OTHER)
Admission: EM | Admit: 2012-10-02 | Discharge: 2012-10-02 | Disposition: A | Discharge status: Nursing Home (Includes ICF) | Payer: Medicare Other | Source: Home / Self Care | Attending: Family Medicine | Admitting: Family Medicine

## 2012-10-02 ENCOUNTER — Encounter (HOSPITAL_COMMUNITY): Payer: Self-pay | Admitting: Cardiology

## 2012-10-02 DIAGNOSIS — R42 Dizziness and giddiness: Secondary | ICD-10-CM | POA: Insufficient documentation

## 2012-10-02 DIAGNOSIS — Z9181 History of falling: Secondary | ICD-10-CM | POA: Insufficient documentation

## 2012-10-02 DIAGNOSIS — Z862 Personal history of diseases of the blood and blood-forming organs and certain disorders involving the immune mechanism: Secondary | ICD-10-CM | POA: Insufficient documentation

## 2012-10-02 DIAGNOSIS — R001 Bradycardia, unspecified: Secondary | ICD-10-CM

## 2012-10-02 DIAGNOSIS — I498 Other specified cardiac arrhythmias: Secondary | ICD-10-CM | POA: Insufficient documentation

## 2012-10-02 DIAGNOSIS — IMO0002 Reserved for concepts with insufficient information to code with codable children: Secondary | ICD-10-CM | POA: Insufficient documentation

## 2012-10-02 DIAGNOSIS — R110 Nausea: Secondary | ICD-10-CM | POA: Insufficient documentation

## 2012-10-02 DIAGNOSIS — Z79899 Other long term (current) drug therapy: Secondary | ICD-10-CM | POA: Diagnosis not present

## 2012-10-02 DIAGNOSIS — Z8639 Personal history of other endocrine, nutritional and metabolic disease: Secondary | ICD-10-CM | POA: Insufficient documentation

## 2012-10-02 DIAGNOSIS — M810 Age-related osteoporosis without current pathological fracture: Secondary | ICD-10-CM | POA: Insufficient documentation

## 2012-10-02 DIAGNOSIS — E785 Hyperlipidemia, unspecified: Secondary | ICD-10-CM | POA: Insufficient documentation

## 2012-10-02 DIAGNOSIS — Z8679 Personal history of other diseases of the circulatory system: Secondary | ICD-10-CM | POA: Diagnosis not present

## 2012-10-02 HISTORY — DX: Age-related osteoporosis without current pathological fracture: M81.0

## 2012-10-02 LAB — POCT I-STAT, CHEM 8
Calcium, Ion: 1.25 mmol/L (ref 1.13–1.30)
Creatinine, Ser: 0.9 mg/dL (ref 0.50–1.10)
Glucose, Bld: 98 mg/dL (ref 70–99)
Hemoglobin: 15.3 g/dL — ABNORMAL HIGH (ref 12.0–15.0)
TCO2: 29 mmol/L (ref 0–100)

## 2012-10-02 LAB — POCT URINALYSIS DIP (DEVICE)
Leukocytes, UA: NEGATIVE
Nitrite: NEGATIVE
Protein, ur: NEGATIVE mg/dL
Urobilinogen, UA: 0.2 mg/dL (ref 0.0–1.0)
pH: 6 (ref 5.0–8.0)

## 2012-10-02 LAB — URINALYSIS, ROUTINE W REFLEX MICROSCOPIC
Glucose, UA: NEGATIVE mg/dL
Ketones, ur: NEGATIVE mg/dL
Leukocytes, UA: NEGATIVE
pH: 7 (ref 5.0–8.0)

## 2012-10-02 LAB — COMPREHENSIVE METABOLIC PANEL
Albumin: 4.1 g/dL (ref 3.5–5.2)
Alkaline Phosphatase: 64 U/L (ref 39–117)
BUN: 15 mg/dL (ref 6–23)
Potassium: 4.9 mEq/L (ref 3.5–5.1)
Sodium: 138 mEq/L (ref 135–145)
Total Protein: 7.3 g/dL (ref 6.0–8.3)

## 2012-10-02 LAB — CBC
HCT: 41.3 % (ref 36.0–46.0)
MCHC: 35.4 g/dL (ref 30.0–36.0)
Platelets: 204 10*3/uL (ref 150–400)
RDW: 12.4 % (ref 11.5–15.5)

## 2012-10-02 MED ORDER — SODIUM CHLORIDE 0.9 % IV BOLUS (SEPSIS)
500.0000 mL | Freq: Once | INTRAVENOUS | Status: AC
  Administered 2012-10-02: 500 mL via INTRAVENOUS

## 2012-10-02 MED ORDER — SODIUM CHLORIDE 0.9 % IV SOLN
Freq: Once | INTRAVENOUS | Status: AC
  Administered 2012-10-02: 16:00:00 via INTRAVENOUS

## 2012-10-02 NOTE — ED Provider Notes (Signed)
Patient care transferred from [facility], PA-C, with labs pending. She was experiencing lightheadedness earlier today, causing a fall after losing her balance. No syncope, chest pain, SOB, history of similar symptoms. No headache, numbness, tingling or weakness. No N, V or fever. Labs are essentially unremarkable. The patient has been ambulated and feel significantly better. Patient care dicussed with Dr. Karma Ganja. Feel she is stable for discharge home and will refer to cardiology for evaluation of bradycardia.  Monitored heart rate drops to 40's in ED with return to 60's without recurrent symptoms. Review of patient's chart shows documented heart rate history of around 60.  Arnoldo Hooker, PA-C 10/02/12 1721

## 2012-10-02 NOTE — ED Notes (Signed)
Patient complains of nausea and dizziness started this morning. States fell and hit her head Friday, and called doctor. Now having nausea and dizziness when getting out of bed this morning.

## 2012-10-02 NOTE — ED Provider Notes (Signed)
History     CSN: [id number]  Arrival date & time 10/02/12  1310   First MD Initiated Contact with Patient 10/02/12 1404      Chief Complaint  Patient presents with  . Nausea  . Dizziness    (Consider location/radiation/quality/duration/timing/severity/associated sxs/prior treatment) HPI  Patient is a 31 old female past medical history significant for migraines presented to ED from urgent care Center this afternoon because of symptomatic bradycardia. The patient had initially gone to urgent care some d/t nausea and intermittent dizziness w/o LOC, syncope. Pt had one episode of falling down this morning onto bed from dizziness, but did not LOC or hit her head. She had one episode of fall on Friday d/t dizziness and hit her head w/o LOC, N/V, neuro focal complaints, weakness, visual disturbance. Patient was recently started on indomethacin Friday by orthopedist, but no other medication changes. Patient has no cardiac history. Patient had never had prior symptoms like this before.      Past Medical History  Diagnosis Date  . Migraines   . Hyperlipidemia   . Vitamin D deficiency   . Osteoporosis     Past Surgical History  Procedure Laterality Date  . Tubal ligation    . Tonsillectomy      Family History  Problem Relation Age of Onset  . Heart attack Mother   . Stroke Mother   . Stroke Father   . Heart disease Father     History  Substance Use Topics  . Smoking status: Never Smoker   . Smokeless tobacco: Not on file  . Alcohol Use: No    OB History   Grav Para Term Preterm Abortions TAB SAB Ect Mult Living                  Review of Systems  Constitutional: Negative for fever and chills.  HENT: Negative.   Eyes: Negative.   Respiratory: Negative.   Cardiovascular: Negative.   Gastrointestinal: Positive for nausea. Negative for vomiting, abdominal pain, diarrhea and abdominal distention.  Genitourinary: Negative.   Musculoskeletal: Negative.   Skin: Negative.    Neurological: Positive for dizziness. Negative for syncope, speech difficulty, weakness and headaches.    Allergies  Codeine  Home Medications   Current Outpatient Rx  Name  Route  Sig  Dispense  Refill  . clorazepate (TRANXENE) 7.5 MG tablet   Oral   Take 3.75-7.5 mg by mouth at bedtime as needed (for migraines).         . fluticasone (FLONASE) 50 MCG/ACT nasal spray   Nasal   Place 2 sprays into the nose daily as needed for rhinitis.         . indomethacin (INDOCIN) 25 MG capsule   Oral   Take 25 mg by mouth 2 (two) times daily with a meal.         . naratriptan (AMERGE) 2.5 MG tablet   Oral   Take 2.5 mg by mouth daily as needed for migraine. Take one (1) tablet at onset of headache; if returns or does not resolve, may repeat after 4 hours; do not exceed five (5) mg in 24 hours.           BP 144/73  Pulse 49  Temp(Src) 97.3 F (36.3 C) (Oral)  Resp 18  SpO2 99%  Physical Exam  Constitutional: She is oriented to person, place, and time. She appears well-developed and well-nourished.  HENT:  Head: Normocephalic and atraumatic.  Right Ear: Tympanic membrane and  external ear normal.  Left Ear: Tympanic membrane and external ear normal.  Nose: Nose normal.  Mouth/Throat: Uvula is midline, oropharynx is clear and moist and mucous membranes are normal.  Eyes: Conjunctivae and EOM are normal. Pupils are equal, round, and reactive to light. Right eye exhibits no discharge. Left eye exhibits no discharge.  Neck: Full passive range of motion without pain. Neck supple. No spinous process tenderness and no muscular tenderness present. No tracheal deviation present.  Cardiovascular: Regular rhythm, normal heart sounds and intact distal pulses.  Bradycardia present.   Pulmonary/Chest: Effort normal and breath sounds normal. No accessory muscle usage. No respiratory distress.  Abdominal: Soft. Bowel sounds are normal. There is no tenderness. There is no rigidity and no  guarding.  Musculoskeletal: Normal range of motion.  Neurological: She is alert and oriented to person, place, and time. She has normal strength. She displays no tremor. No cranial nerve deficit or sensory deficit. She exhibits normal muscle tone. Coordination and gait normal.  Skin: Skin is warm and dry. No rash noted.  Psychiatric: She has a normal mood and affect.    ED Course  Procedures (including critical care time)   Date: 10/02/2012  Rate: 46  Rhythm: sinus bradycardia  QRS Axis: normal  Intervals: normal  ST/T Wave abnormalities: normal  Conduction Disutrbances:none  Narrative Interpretation:   Old EKG Reviewed: none available  Patient ambulated in ED w/o difficulty or dizziness.  Orthostatics  Sitting-  P 56 BP 143/67 Standing: P 72 BP 124/84   Labs Reviewed  COMPREHENSIVE METABOLIC PANEL - Abnormal; Notable for the following:    GFR calc non Af Amer 85 (*)    All other components within normal limits  CBC  URINALYSIS, ROUTINE W REFLEX MICROSCOPIC   No results found.   1. Bradycardia       MDM  Pt presents from [facility] d/t bradycardia. Pt had gone to [facility] to be seen for dizziness w/o syncopal episode x 3 days. Pt examination benign. No neurofocal deficits on examination. Able to ambulate w/o hypoxemia or bradycardia. Labs unremarkable as noted above. Pt remained stable at time of shift change. Disposition is to discharge w/ cardiology follow up as outpatient once labs return and patient finishes fluids.  Patient d/w with Dr. Karma Ganja, agrees with plan. Patient signed out to [facility].        Jeannetta Ellis, PA-C 10/02/12 1722

## 2012-10-02 NOTE — ED Notes (Signed)
Pt reports some nausea and dizziness that started this morning. Reports she recently started a new medication, indomethacin, for her foot. States she feels like the symptoms could be related to the medication. Denies any chest pain or SOB. EKG performed at [facility] and blood work. Pt reports increased dizziness with position changes.

## 2012-10-02 NOTE — ED Provider Notes (Signed)
History     CSN: [id number]  Arrival date & time 10/02/12  1124   First MD Initiated Contact with Patient 10/02/12 1134      Chief Complaint  Patient presents with  . Nausea  . Dizziness    (Consider location/radiation/quality/duration/timing/severity/associated sxs/prior treatment) HPI Comments: 67 year old female with history of migraines. Here complaining of nausea and dizziness since yesterday. Patient also has a history of a right side temporal head injury 3 days ago.( reports she slipped and felt towards her right side hitting her head against the wall). No loss of consciousness. Currently taking indomethacin since yesterday for an orthopedic problem. Patient also reports taking a benzodiazepine and tricyclic antidepressant for several years without side effects. No dysuria, no vomiting or diarrhea. No fever or chills. Has had h/o chronic headache/migraines in the past. Denies headache above base line, no memory or concentration problems, denies tinnitus. Patient reports that laying flat in the bed worsens her nausea. She reports an episode this morning when she sat in the bed felt very dizzy to the point that she fell again although this time she fell over the bed and did not hit her body or head against any solid surface. Denies palpitations, chest pain or shortness of breath associated with her symptoms. No history of vertigo in the past. No history of diabetes. In an   Past Medical History  Diagnosis Date  . Migraines   . Hyperlipidemia   . Vitamin D deficiency     Past Surgical History  Procedure Laterality Date  . Tubal ligation    . Tonsillectomy      Family History  Problem Relation Age of Onset  . Heart attack Mother   . Stroke Mother   . Stroke Father   . Heart disease Father     History  Substance Use Topics  . Smoking status: Never Smoker   . Smokeless tobacco: Not on file  . Alcohol Use: No    OB History   Grav Para Term Preterm Abortions TAB SAB  Ect Mult Living                  Review of Systems  Constitutional: Negative for fever, chills, diaphoresis, appetite change and fatigue.  HENT: Negative for ear pain, congestion, sore throat, rhinorrhea, trouble swallowing, neck pain, sinus pressure and tinnitus.        Head trauma as per history of present illness.  Eyes: Negative for visual disturbance.  Respiratory: Negative for chest tightness.   Cardiovascular: Negative for chest pain and leg swelling.  Gastrointestinal: Positive for nausea. Negative for vomiting, abdominal pain and diarrhea.  Genitourinary: Negative for dysuria.  Skin: Negative for rash.  Neurological: Positive for dizziness. Negative for tremors, seizures, syncope, speech difficulty, weakness, numbness and headaches.  All other systems reviewed and are negative.    Allergies  Codeine  Home Medications   Current Outpatient Rx  Name  Route  Sig  Dispense  Refill  . indomethacin (INDOCIN) 25 MG capsule   Oral   Take 25 mg by mouth 2 (two) times daily with a meal.         . alendronate (FOSAMAX) 70 MG tablet   Oral   Take 1 tablet (70 mg total) by mouth every 7 (seven) days. Take with a full glass of water on an empty stomach.   4 tablet   11   . clorazepate (TRANXENE) 7.5 MG tablet      TAKE 1/2 TO 1 TABLET BY  MOUTH AT NIGHT   30 tablet   1   . fluticasone (FLONASE) 50 MCG/ACT nasal spray   Nasal   Place 2 sprays into the nose daily.   16 g   6   . naratriptan (AMERGE) 2.5 MG tablet   Oral   Take 1 tablet (2.5 mg total) by mouth as needed for migraine. Take one (1) tablet at onset of headache; if returns or does not resolve, may repeat after 4 hours; do not exceed five (5) mg in 24 hours.   10 tablet   5     BP 152/91  Pulse 57  Temp(Src) 97.5 F (36.4 C) (Oral)  SpO2 100%  Physical Exam  Nursing note and vitals reviewed. Constitutional: She is oriented to person, place, and time. She appears well-developed and well-nourished.  No distress.  HENT:  Head: Normocephalic.  Right Ear: External ear normal.  Left Ear: External ear normal.  Nose: Nose normal.  Mouth/Throat: Oropharynx is clear and moist. No oropharyngeal exudate.  Tenderness and minimal swelling in temporal area above right ear. No hematoma or lacerations.  Eyes: Conjunctivae and EOM are normal. Pupils are equal, round, and reactive to light. No scleral icterus.  No nystagmus  Neck: No JVD present. No thyromegaly present.  Cardiovascular: Regular rhythm and normal heart sounds.  Exam reveals no gallop and no friction rub.   No murmur heard. Bradycardic  Pulmonary/Chest: Effort normal and breath sounds normal. No respiratory distress. She has no wheezes. She has no rales. She exhibits no tenderness.  Lymphadenopathy:    She has no cervical adenopathy.  Neurological: She is alert and oriented to person, place, and time. She has normal strength and normal reflexes. She displays normal reflexes. No cranial nerve deficit or sensory deficit. She exhibits normal muscle tone. She displays a negative Romberg sign. Coordination and gait normal.  Skin: No rash noted. She is not diaphoretic.    ED Course  Procedures (including critical care time)  Labs Reviewed  POCT I-STAT, CHEM 8 - Abnormal; Notable for the following:    Hemoglobin 15.3 (*)    All other components within normal limits  POCT URINALYSIS DIP (DEVICE)   No results found.   1. Symptomatic sinus bradycardia     EKG: Sinus bradycardia with a ventricular rate at 46 beats per minutes no obvious AV or bundle branch blocks.   MDM  67 year old female Here complaining of nausea and dizziness to the point of falling since yesterday. On exam: Bradycardic otherwise normal stable vital signs. No significant changes with orthostatic vital signs. Although patient reports dizziness worsens when laying flat. Head with no hematoma or lacerations. Heart sounds bradycardic otherwise no split sounds, gallops  or murmurs. Lungs are clear. Normal abdominal exam. No acute ischemic changes. Normal hemoglobin, and electrolytes. Impress patient here with symptomatic bradycardia vs new onset positional vertigo (takes TCA and benzodiapines daily), decided to transfer patient to the emergency department for further evaluation and management.         Sharin Grave, MD 10/02/12 1346

## 2012-10-03 NOTE — ED Provider Notes (Signed)
Medical screening examination/treatment/procedure(s) were performed by non-physician practitioner and as supervising physician I was immediately available for consultation/collaboration.  Kieara Schwark K Linker, MD 10/03/12 0917 

## 2012-10-03 NOTE — ED Provider Notes (Signed)
Medical screening examination/treatment/procedure(s) were performed by non-physician practitioner and as supervising physician I was immediately available for consultation/collaboration.  Ethelda Chick, MD 10/03/12 [phone]

## 2012-10-06 DIAGNOSIS — M79609 Pain in unspecified limb: Secondary | ICD-10-CM | POA: Diagnosis not present

## 2012-10-13 DIAGNOSIS — R5381 Other malaise: Secondary | ICD-10-CM | POA: Diagnosis not present

## 2012-10-13 DIAGNOSIS — R5383 Other fatigue: Secondary | ICD-10-CM | POA: Diagnosis not present

## 2012-10-13 DIAGNOSIS — I498 Other specified cardiac arrhythmias: Secondary | ICD-10-CM | POA: Diagnosis not present

## 2012-10-13 DIAGNOSIS — R069 Unspecified abnormalities of breathing: Secondary | ICD-10-CM | POA: Diagnosis not present

## 2012-11-01 ENCOUNTER — Ambulatory Visit: Payer: Medicare Other | Admitting: Cardiovascular Disease

## 2012-11-02 ENCOUNTER — Ambulatory Visit: Payer: Medicare Other | Admitting: Family Medicine

## 2012-11-14 ENCOUNTER — Encounter (INDEPENDENT_AMBULATORY_CARE_PROVIDER_SITE_OTHER): Payer: Medicare Other

## 2012-11-14 DIAGNOSIS — R079 Chest pain, unspecified: Secondary | ICD-10-CM | POA: Diagnosis not present

## 2012-11-14 DIAGNOSIS — R0602 Shortness of breath: Secondary | ICD-10-CM | POA: Diagnosis not present

## 2012-11-14 LAB — PULMONARY FUNCTION TEST

## 2012-11-17 ENCOUNTER — Encounter: Payer: Self-pay | Admitting: Internal Medicine

## 2012-11-29 ENCOUNTER — Encounter: Payer: Self-pay | Admitting: Internal Medicine

## 2012-11-29 ENCOUNTER — Ambulatory Visit (INDEPENDENT_AMBULATORY_CARE_PROVIDER_SITE_OTHER): Payer: Medicare Other | Admitting: Internal Medicine

## 2012-11-29 VITALS — BP 122/82 | HR 68 | Ht 62.0 in | Wt 149.2 lb

## 2012-11-29 DIAGNOSIS — R42 Dizziness and giddiness: Secondary | ICD-10-CM | POA: Diagnosis not present

## 2012-11-29 DIAGNOSIS — E785 Hyperlipidemia, unspecified: Secondary | ICD-10-CM

## 2012-11-29 DIAGNOSIS — R55 Syncope and collapse: Secondary | ICD-10-CM

## 2012-11-29 NOTE — Progress Notes (Signed)
OFFICE NOTE  Chief Complaint:  Followup of metabolic testing  Primary Care Physician: Loreen Freud, DO  HPI:  Kylie Wheeler  Is a 67 year old new patient kindly referred to me for evaluation of an episode of possible syncope associated with dizziness, more specifically vertigo, and an episode of nausea when lying in her bed.  She reported an episode of several weeks ago where she was lying in bed, felt a little dizzy, sat up in the middle of the night, got up and walked a little bit and felt again dizzy, and then fell. She did report recollection of the fall, got back into bed and spoke with her daughter and felt very dizzy while lying in bed. The episode seemed to be associated with a change in head position, and she notes that even now with turning her head back and forth to the left and right she does feel a very milder form of this dizziness, again associated with some nausea. She did present to the ER for evaluation of this and at the time was found to be bradycardic with a heart rate of about 50. Interestingly, she has had a low heart rate for at least, she knows, several years going back but is really unaware of whether it was slow before that or not.  She has also reported about a 35-pound weight gain in the last 2 years with decreased exercise and some increasing fatigue. She has always had trouble sleeping at night and takes medication for that, but recently she has had more fatigue throughout the day, lack of energy, and trouble with symptoms. She does also have a history of migraine headaches but has not had any significant increase in migraine frequency.  She had testing done for orthostatic hypotension in the emergency department. Her heart rate did increase with change in position from 56 to 72 and blood pressure dropped from 143/67 to 124/84. Interestingly, she was also in indomethacin at that time which could be associated with some mild orthostasis. She has since stopped that  medicine and has had no further episodes.  At her last visit I recommended metabolic testing to look for chronotropic incompetence and to measure her VO2.  She returns today feeling well and is anxious about her test results.  Her met test revealed a adequate our ER. She did have a PO2 of 95% predicted. Her heart rate and VO2 [place] demonstrated a sharp increase in heart rate at an aerobic threshold with a flattening of the VO2 current curve consistent with an ischemic response. Her max heart rate was 95% predicted. There was however activity on the EKG, and the heart rate could have been overestimated. PFTs were also performed, which demonstrated mild obstructive limitation, but there were massive bleeds and total lung capacity could not be determined. Diffusion was fairly normal. Overall the study is interpreted as high risk, however is unlikely that she has significant obstructive coronary disease with a high VO2.  PMHx:  Past Medical History  Diagnosis Date  . Migraines   . Hyperlipidemia   . Vitamin D deficiency   . Osteoporosis   . Insomnia     Past Surgical History  Procedure Laterality Date  . Tubal ligation    . Tonsillectomy      FAMHx:  Family History  Problem Relation Age of Onset  . Stroke Mother   . Heart attack Mother   . Atrial fibrillation Mother   . Heart failure Mother   . Stroke Father   .  Heart failure Father   . Hyperlipidemia Brother   . Hypertension Brother   . Heart disease Maternal Grandfather   . Heart disease Paternal Grandmother   . Heart disease Paternal Grandfather   . Hyperlipidemia Sister   . Hyperlipidemia Brother   . Hypertension Brother     SOCHx:   reports that she has been passively smoking.  She has never used smokeless tobacco. She reports that she does not drink alcohol or use illicit drugs.  ALLERGIES:  Allergies  Allergen Reactions  . Codeine Nausea Only    REACTION: sick on stomach    ROS: Pertinent items are noted in  HPI.  HOME MEDS: Current Outpatient Prescriptions  Medication Sig Dispense Refill  . clorazepate (TRANXENE) 7.5 MG tablet Take 3.75 mg by mouth at bedtime as needed (for migraines).       . fluticasone (FLONASE) 50 MCG/ACT nasal spray Place 2 sprays into the nose daily as needed for rhinitis.      . naratriptan (AMERGE) 2.5 MG tablet Take 2.5 mg by mouth daily as needed for migraine. Take one (1) tablet at onset of headache; if returns or does not resolve, may repeat after 4 hours; do not exceed five (5) mg in 24 hours.      . TOVIAZ 4 MG TB24 Take 4 mg by mouth daily.       No current facility-administered medications for this visit.    LABS/IMAGING: No results found for this or any previous visit (from the past 48 hour(s)). No results found.  VITALS: BP 122/82  Pulse 68  Ht 5\' 2"  (1.575 m)  Wt 149 lb 3.2 oz (67.677 kg)  BMI 27.28 kg/m2  EXAM: deferred  EKG: deferred  ASSESSMENT: 1. Falls secondary to vertigo 2. Mild dyslipidemia 3. Fatigue  PLAN: 1.  [name] had an abnormal metabolic test which demonstrated probably small vessel ischemia. Her VO2 is high, which predicts a better outcome, however her heart rate in the acute [place] views demonstrate an ischemic response. This may be due to small vessel ischemia, diastolic dysfunction, or some underlying etiology that is not well understood. I feel that this is a low risk task, does not explain her episode of either falling or passing out. I suspect this is more likely due to benign positional vertigo, or medication which she is now discontinued.  I recommended increased exercise and continue dietary changes along with excellent cholesterol control to reduce her risk of cardiovascular events. I don't see a need for further testing at this time unless she develops worsening chest pain or increased shortness of breath especially during exertion. She can followup with [place] as needed.  Chrystie Nose, MD, [facility] Attending  Cardiologist [facility]  HILTY,Kenneth C 11/29/2012, 2:37 PM

## 2012-11-29 NOTE — Patient Instructions (Signed)
Your physician recommends that you schedule a follow-up appointment as needed  

## 2012-12-01 ENCOUNTER — Ambulatory Visit: Payer: Medicare Other | Admitting: Internal Medicine

## 2012-12-08 ENCOUNTER — Ambulatory Visit (INDEPENDENT_AMBULATORY_CARE_PROVIDER_SITE_OTHER): Payer: Medicare Other | Admitting: Family Medicine

## 2012-12-08 ENCOUNTER — Encounter: Payer: Self-pay | Admitting: Family Medicine

## 2012-12-08 VITALS — BP 118/78 | HR 74 | Temp 98.1°F

## 2012-12-08 DIAGNOSIS — A499 Bacterial infection, unspecified: Secondary | ICD-10-CM

## 2012-12-08 DIAGNOSIS — J329 Chronic sinusitis, unspecified: Secondary | ICD-10-CM | POA: Diagnosis not present

## 2012-12-08 DIAGNOSIS — G43909 Migraine, unspecified, not intractable, without status migrainosus: Secondary | ICD-10-CM

## 2012-12-08 DIAGNOSIS — B9689 Other specified bacterial agents as the cause of diseases classified elsewhere: Secondary | ICD-10-CM

## 2012-12-08 MED ORDER — AMOXICILLIN-POT CLAVULANATE 875-125 MG PO TABS: 1.0000 | ORAL_TABLET | Freq: Two times a day (BID) | ORAL | Status: DC

## 2012-12-08 MED ORDER — AZELASTINE-FLUTICASONE 137-50 MCG/ACT NA SUSP: 1.0000 | Freq: Two times a day (BID) | NASAL | Status: DC

## 2012-12-08 MED ORDER — CLORAZEPATE DIPOTASSIUM 7.5 MG PO TABS: 3.7500 mg | ORAL_TABLET | Freq: Every evening | ORAL | Status: DC | PRN

## 2012-12-08 NOTE — Patient Instructions (Signed)

## 2012-12-08 NOTE — Progress Notes (Signed)
  Subjective:     Kylie Wheeler is a 67 y.o. female who presents for evaluation of sinus pain. Symptoms include: congestion, cough, facial pain, headaches, nasal congestion, post nasal drip and sinus pressure. Onset of symptoms was 1 day ago. Symptoms have been gradually worsening since that time. Past history is significant for no history of pneumonia or bronchitis. Patient is a non-smoker.  The following portions of the patient's history were reviewed and updated as appropriate: allergies, current medications, past family history, past medical history, past social history, past surgical history and problem list.  Review of Systems Pertinent items are noted in HPI.   Objective:    BP 118/78  Pulse 74  Temp(Src) 98.1 F (36.7 C) (Oral)  SpO2 97% General appearance: alert, cooperative, appears stated age and no distress Ears: normal TM's and external ear canals both ears Nose: green discharge, moderate congestion, turbinates red, swollen, sinus tenderness bilateral Throat: lips, mucosa, and tongue normal; teeth and gums normal Neck: mild anterior cervical adenopathy, supple, symmetrical, trachea midline and thyroid not enlarged, symmetric, no tenderness/mass/nodules Lungs: clear to auscultation bilaterally Heart: S1, S2 normal    Assessment:    Acute allergic sinusitis.    Plan:    Nasal steroids per medication orders. Antihistamines per medication orders. if last 7-10 days more she can fill rx for augmentin

## 2012-12-14 ENCOUNTER — Institutional Professional Consult (permissible substitution): Payer: Medicare Other | Admitting: Cardiology

## 2012-12-28 DIAGNOSIS — R0602 Shortness of breath: Secondary | ICD-10-CM | POA: Diagnosis not present

## 2012-12-28 DIAGNOSIS — R9430 Abnormal result of cardiovascular function study, unspecified: Secondary | ICD-10-CM | POA: Diagnosis not present

## 2012-12-28 DIAGNOSIS — R5383 Other fatigue: Secondary | ICD-10-CM | POA: Diagnosis not present

## 2012-12-28 DIAGNOSIS — R55 Syncope and collapse: Secondary | ICD-10-CM | POA: Diagnosis not present

## 2012-12-28 DIAGNOSIS — R5381 Other malaise: Secondary | ICD-10-CM | POA: Diagnosis not present

## 2013-01-12 DIAGNOSIS — R55 Syncope and collapse: Secondary | ICD-10-CM | POA: Diagnosis not present

## 2013-01-12 DIAGNOSIS — R0602 Shortness of breath: Secondary | ICD-10-CM | POA: Diagnosis not present

## 2013-01-16 DIAGNOSIS — R5381 Other malaise: Secondary | ICD-10-CM | POA: Diagnosis not present

## 2013-01-16 DIAGNOSIS — R5383 Other fatigue: Secondary | ICD-10-CM | POA: Diagnosis not present

## 2013-01-20 DIAGNOSIS — R55 Syncope and collapse: Secondary | ICD-10-CM | POA: Diagnosis not present

## 2013-01-20 DIAGNOSIS — G43909 Migraine, unspecified, not intractable, without status migrainosus: Secondary | ICD-10-CM | POA: Diagnosis not present

## 2013-01-20 DIAGNOSIS — R5381 Other malaise: Secondary | ICD-10-CM | POA: Diagnosis not present

## 2013-01-20 DIAGNOSIS — R5383 Other fatigue: Secondary | ICD-10-CM | POA: Diagnosis not present

## 2013-01-24 DIAGNOSIS — M4120 Other idiopathic scoliosis, site unspecified: Secondary | ICD-10-CM | POA: Diagnosis not present

## 2013-01-24 DIAGNOSIS — Z9181 History of falling: Secondary | ICD-10-CM | POA: Diagnosis not present

## 2013-01-24 DIAGNOSIS — IMO0002 Reserved for concepts with insufficient information to code with codable children: Secondary | ICD-10-CM | POA: Diagnosis not present

## 2013-01-24 DIAGNOSIS — S335XXA Sprain of ligaments of lumbar spine, initial encounter: Secondary | ICD-10-CM | POA: Diagnosis not present

## 2013-01-24 DIAGNOSIS — M545 Low back pain, unspecified: Secondary | ICD-10-CM | POA: Diagnosis not present

## 2013-03-07 DIAGNOSIS — R5381 Other malaise: Secondary | ICD-10-CM | POA: Diagnosis not present

## 2013-03-07 DIAGNOSIS — E7210 Disorders of sulfur-bearing amino-acid metabolism, unspecified: Secondary | ICD-10-CM | POA: Diagnosis not present

## 2013-03-07 DIAGNOSIS — E782 Mixed hyperlipidemia: Secondary | ICD-10-CM | POA: Diagnosis not present

## 2013-03-07 DIAGNOSIS — E559 Vitamin D deficiency, unspecified: Secondary | ICD-10-CM | POA: Diagnosis not present

## 2013-03-07 DIAGNOSIS — R7989 Other specified abnormal findings of blood chemistry: Secondary | ICD-10-CM | POA: Diagnosis not present

## 2013-03-07 DIAGNOSIS — D509 Iron deficiency anemia, unspecified: Secondary | ICD-10-CM | POA: Diagnosis not present

## 2013-03-14 DIAGNOSIS — R5381 Other malaise: Secondary | ICD-10-CM | POA: Diagnosis not present

## 2013-03-22 ENCOUNTER — Ambulatory Visit (INDEPENDENT_AMBULATORY_CARE_PROVIDER_SITE_OTHER): Payer: Medicare Other | Admitting: Podiatry

## 2013-03-22 ENCOUNTER — Encounter: Payer: Self-pay | Admitting: Podiatry

## 2013-03-22 ENCOUNTER — Ambulatory Visit (INDEPENDENT_AMBULATORY_CARE_PROVIDER_SITE_OTHER): Payer: Medicare Other

## 2013-03-22 VITALS — BP 123/70 | HR 58 | Resp 16 | Ht 62.0 in | Wt 149.0 lb

## 2013-03-22 DIAGNOSIS — M7750 Other enthesopathy of unspecified foot: Secondary | ICD-10-CM

## 2013-03-22 DIAGNOSIS — L723 Sebaceous cyst: Secondary | ICD-10-CM

## 2013-03-22 DIAGNOSIS — M6740 Ganglion, unspecified site: Secondary | ICD-10-CM

## 2013-03-22 MED ORDER — TRIAMCINOLONE ACETONIDE 10 MG/ML IJ SUSP
5.0000 mg | Freq: Once | INTRAMUSCULAR | Status: AC
  Administered 2013-03-22: 5 mg via INTRA_ARTICULAR

## 2013-03-22 NOTE — Progress Notes (Signed)
N HURT L LEFT FOOT MEDIAL CYST? D 4M + O SLOWLY C WORSE A WALKING T 0

## 2013-03-22 NOTE — Patient Instructions (Signed)
Reduced activity for 48 hours

## 2013-03-22 NOTE — Progress Notes (Signed)
Subjective:     Patient ID: Kylie Wheeler, female   DOB: 1946-03-06, 67 y.o.   MRN: [id number]  Foot Pain   patient presents complaining of pain in the inside of her left foot states that she think she has a cyst and was diagnosed by Dr. Darrelyn Hillock with this. He placed her on oral anti-inflammatories which did not help her problem states that she has trouble walking due to pain.  Review of Systems  All other systems reviewed and are negative.       Objective:   Physical Exam  Nursing note and vitals reviewed. Cardiovascular: Intact distal pulses.    neurological found to be intact bilateral muscle strength was adequate and I specifically checked the anterior tibial muscle left. Patient has discomfort at the anterior tibial insertion into the cuneiform and base of first metatarsal left range of motion adequate mild equinus condition noted. I did note small nodule around the area but it was difficult to identify or palpate.    Assessment:     Probable anterior tibial tendinitis left with possibility for ganglionic cyst present    Plan:     Initial H&P performed. X-ray reviewed. I did a sterile injection around the anterior tibial insertion with 60 mg Xylocaine Marcaine mixture I then prepped the area and utilizing 10 cc syringe I attempted to aspirate the area. I was unable to get any gelatinous fluid indicating more of a tendinitis versus ganglionic cyst formation here I did inject with 3 mg Kenalog 5 mg Xylocaine to reduce the inflammation. I advised the patient on reduced activity for several days reappoint in 2 weeks

## 2013-04-05 ENCOUNTER — Ambulatory Visit (INDEPENDENT_AMBULATORY_CARE_PROVIDER_SITE_OTHER): Payer: Medicare Other | Admitting: Podiatry

## 2013-04-05 ENCOUNTER — Encounter: Payer: Self-pay | Admitting: Podiatry

## 2013-04-05 VITALS — BP 112/66 | HR 69 | Resp 12

## 2013-04-05 DIAGNOSIS — M7750 Other enthesopathy of unspecified foot: Secondary | ICD-10-CM | POA: Diagnosis not present

## 2013-04-05 NOTE — Progress Notes (Signed)
Subjective:     Patient ID: Kylie Wheeler, female   DOB: [birth year], 67 y.o.   MRN: [id number]  Foot Pain   patient presents stating my left foot is still hurting me states it got better for several days after the injection and then the pain returned   Review of Systems  All other systems reviewed and are negative.       Objective:   Physical Exam  Nursing note and vitals reviewed. Cardiovascular: Intact distal pulses.   Musculoskeletal: Normal range of motion.  Neurological: She is alert.  Skin: Skin is warm.   muscle strength was found to be intact anterior tibial muscle insertion. I noted swelling around the insertion with inflammation of the tissue and bone structure noted     Assessment:      tendinitis anterior tibial tendon    Plan:     Reviewed condition and patient does remember walking abnormally on the foot due to injury to the outside which probably caused this to start. Today due to the tenderness and failure to respond I did immobilize the foot with air fracture walker and scanned for custom orthotic devices all instructions given the patient

## 2013-04-20 ENCOUNTER — Other Ambulatory Visit: Payer: Self-pay

## 2013-04-24 ENCOUNTER — Encounter: Payer: Self-pay | Admitting: Cardiology

## 2013-05-29 ENCOUNTER — Ambulatory Visit (INDEPENDENT_AMBULATORY_CARE_PROVIDER_SITE_OTHER): Payer: Medicare Other | Admitting: Podiatry

## 2013-05-29 ENCOUNTER — Encounter: Payer: Self-pay | Admitting: Podiatry

## 2013-05-29 VITALS — BP 110/64 | HR 63 | Resp 16

## 2013-05-29 DIAGNOSIS — M7750 Other enthesopathy of unspecified foot: Secondary | ICD-10-CM | POA: Diagnosis not present

## 2013-05-29 NOTE — Progress Notes (Signed)
Subjective:     Patient ID: Kylie Wheeler, female   DOB: 10/07/1945, 67 y.o.   MRN: [id number]  HPI patient states that her left foot is better but still sore and swollen around the tendon   Review of Systems     Objective:   Physical Exam Neurovascular status intact with continued discomfort anterior tbial nowhere near as bad as it was previous    Assessment:     Tendinitis left with possibility for injury to the tendon or bone injury    Plan:     Explaining we may need to do an MRI but at this time orthotics will be started along with physical therapy reappoint her recheck in 6 weeks

## 2013-05-29 NOTE — Patient Instructions (Signed)

## 2013-06-05 ENCOUNTER — Other Ambulatory Visit: Payer: Self-pay | Admitting: Cardiology

## 2013-06-05 DIAGNOSIS — E7800 Pure hypercholesterolemia, unspecified: Secondary | ICD-10-CM | POA: Diagnosis not present

## 2013-06-05 DIAGNOSIS — G459 Transient cerebral ischemic attack, unspecified: Secondary | ICD-10-CM

## 2013-06-05 DIAGNOSIS — Z79899 Other long term (current) drug therapy: Secondary | ICD-10-CM | POA: Diagnosis not present

## 2013-06-06 ENCOUNTER — Ambulatory Visit
Admission: RE | Admit: 2013-06-06 | Discharge: 2013-06-06 | Disposition: A | Discharge status: Home / Self Care | Payer: Medicare Other | Source: Ambulatory Visit | Attending: Cardiology | Admitting: Cardiology

## 2013-06-06 DIAGNOSIS — G9389 Other specified disorders of brain: Secondary | ICD-10-CM | POA: Diagnosis not present

## 2013-06-06 DIAGNOSIS — G459 Transient cerebral ischemic attack, unspecified: Secondary | ICD-10-CM

## 2013-06-06 MED ORDER — IOHEXOL 300 MG/ML  SOLN
75.0000 mL | Freq: Once | INTRAMUSCULAR | Status: AC | PRN
  Administered 2013-06-06: 75 mL via INTRAVENOUS

## 2013-06-29 DIAGNOSIS — G459 Transient cerebral ischemic attack, unspecified: Secondary | ICD-10-CM | POA: Diagnosis not present

## 2013-07-03 ENCOUNTER — Ambulatory Visit (INDEPENDENT_AMBULATORY_CARE_PROVIDER_SITE_OTHER): Payer: Medicare Other | Admitting: Podiatry

## 2013-07-03 ENCOUNTER — Encounter: Payer: Self-pay | Admitting: Podiatry

## 2013-07-03 VITALS — BP 122/67 | HR 62 | Resp 15 | Ht 62.0 in | Wt 150.0 lb

## 2013-07-03 DIAGNOSIS — M7750 Other enthesopathy of unspecified foot: Secondary | ICD-10-CM | POA: Diagnosis not present

## 2013-07-03 MED ORDER — DICLOFENAC SODIUM 75 MG PO TBEC: 75.0000 mg | DELAYED_RELEASE_TABLET | Freq: Two times a day (BID) | ORAL | Status: DC

## 2013-07-03 NOTE — Progress Notes (Signed)
Subjective:     Patient ID: Kylie Wheeler, female   DOB: 24-Aug-1945, 68 y.o.   MRN: [id number]  HPI patient states that she is still having pain in her left foot and that the orthotics do not seem to be helping her   Review of Systems     Objective:   Physical Exam Neurovascular status intact with no other health history changes noted and edema along the anterior tibial tendon left as it inserts into the navicular    Assessment:     Tendinitis left with inflammation which is most likely overuse syndrome versus tear    Plan:     We will now increase ice therapy continue immobilization and I placed her on anti-inflammatory. If improvement not forthcoming in 4 weeks we will consider MRI

## 2013-07-03 NOTE — Progress Notes (Signed)
   Subjective:    Patient ID: Kylie Wheeler, female    DOB: 11/16/1945, 68 y.o.   MRN: [id number] Pt states still pain and swelling in the medial left arch. HPI    Review of Systems     Objective:   Physical Exam        Assessment & Plan:

## 2013-07-18 DIAGNOSIS — N3946 Mixed incontinence: Secondary | ICD-10-CM | POA: Diagnosis not present

## 2013-07-21 ENCOUNTER — Other Ambulatory Visit: Payer: Self-pay | Admitting: Family Medicine

## 2013-07-31 ENCOUNTER — Encounter: Payer: Self-pay | Admitting: Podiatry

## 2013-07-31 ENCOUNTER — Ambulatory Visit (INDEPENDENT_AMBULATORY_CARE_PROVIDER_SITE_OTHER): Payer: Medicare Other | Admitting: Podiatry

## 2013-07-31 ENCOUNTER — Other Ambulatory Visit: Payer: Self-pay | Admitting: *Deleted

## 2013-07-31 VITALS — BP 138/77 | HR 64 | Resp 12

## 2013-07-31 DIAGNOSIS — S93609A Unspecified sprain of unspecified foot, initial encounter: Secondary | ICD-10-CM

## 2013-07-31 DIAGNOSIS — R52 Pain, unspecified: Secondary | ICD-10-CM

## 2013-07-31 DIAGNOSIS — M7750 Other enthesopathy of unspecified foot: Secondary | ICD-10-CM | POA: Diagnosis not present

## 2013-07-31 DIAGNOSIS — S96912A Strain of unspecified muscle and tendon at ankle and foot level, left foot, initial encounter: Secondary | ICD-10-CM

## 2013-08-01 NOTE — Progress Notes (Signed)
Subjective:     Patient ID: Kylie Wheeler, female   DOB: Oct 14, 1945, 68 y.o.   MRN: [id number]  HPI patient states that she is continuing to have pain in her left foot no matter what we do. We have tried previous injections immobilizations anti-inflammatory and orthotics   Review of Systems     Objective:   Physical Exam Neurovascular status intact with patient well oriented x3 and edema and discomfort in the anterior tibial tendon as it inserts into the base of the first metatarsal and cuneiform    Assessment:     Possible tear of the anterior tibial tendon secondary to continued edema and pain    Plan:     With failure to respond I have recommended MRI to rule out tear or possible bone stress

## 2013-08-04 ENCOUNTER — Ambulatory Visit
Admission: RE | Admit: 2013-08-04 | Discharge: 2013-08-04 | Disposition: A | Discharge status: Home / Self Care | Payer: Medicare Other | Source: Ambulatory Visit | Attending: Podiatry | Admitting: Podiatry

## 2013-08-04 DIAGNOSIS — S99919A Unspecified injury of unspecified ankle, initial encounter: Secondary | ICD-10-CM | POA: Diagnosis not present

## 2013-08-04 DIAGNOSIS — S8990XA Unspecified injury of unspecified lower leg, initial encounter: Secondary | ICD-10-CM | POA: Diagnosis not present

## 2013-08-04 DIAGNOSIS — M79609 Pain in unspecified limb: Secondary | ICD-10-CM | POA: Diagnosis not present

## 2013-08-04 DIAGNOSIS — R52 Pain, unspecified: Secondary | ICD-10-CM

## 2013-08-04 DIAGNOSIS — S99929A Unspecified injury of unspecified foot, initial encounter: Secondary | ICD-10-CM | POA: Diagnosis not present

## 2013-08-13 DIAGNOSIS — N3946 Mixed incontinence: Secondary | ICD-10-CM | POA: Diagnosis not present

## 2013-08-23 ENCOUNTER — Encounter: Payer: Self-pay | Admitting: Podiatry

## 2013-08-23 ENCOUNTER — Ambulatory Visit (INDEPENDENT_AMBULATORY_CARE_PROVIDER_SITE_OTHER): Payer: Medicare Other | Admitting: Podiatry

## 2013-08-23 VITALS — BP 137/78 | HR 60 | Resp 16 | Ht 62.5 in | Wt 150.0 lb

## 2013-08-23 DIAGNOSIS — M7750 Other enthesopathy of unspecified foot: Secondary | ICD-10-CM

## 2013-08-23 DIAGNOSIS — N3946 Mixed incontinence: Secondary | ICD-10-CM | POA: Diagnosis not present

## 2013-08-23 NOTE — Progress Notes (Signed)
Pt states no change in the foot since the MRI.

## 2013-08-23 NOTE — Progress Notes (Signed)
Subjective:     Patient ID: Kylie Wheeler, female   DOB: 08-04-1945, 68 y.o.   MRN: [id number]  HPI patient presents stating I am still getting the pain and I wanted to review the results of my MRI left foot. States the pain is not severe but is always present and at times is sharp and shooting   Review of Systems     Objective:   Physical Exam Neurovascular status intact with muscle strength adequate and no other health history changes noted. Continued edema along anterior tibial tendon left with inflammation at its insertion into the cuneiform base of first metatarsal    Assessment:     Continued chronic inflammation in this area with negative MRI    Plan:     Discussed physical therapy we'll start her on physical therapy and I also may get a second opinion depending on results of physical therapy at this time

## 2013-08-24 ENCOUNTER — Other Ambulatory Visit: Payer: Self-pay | Admitting: *Deleted

## 2013-08-24 ENCOUNTER — Ambulatory Visit: Payer: Medicare Other | Admitting: Podiatry

## 2013-08-24 DIAGNOSIS — M79606 Pain in leg, unspecified: Secondary | ICD-10-CM

## 2013-09-26 DIAGNOSIS — L819 Disorder of pigmentation, unspecified: Secondary | ICD-10-CM | POA: Diagnosis not present

## 2013-09-26 DIAGNOSIS — Z411 Encounter for cosmetic surgery: Secondary | ICD-10-CM | POA: Diagnosis not present

## 2013-09-26 DIAGNOSIS — L909 Atrophic disorder of skin, unspecified: Secondary | ICD-10-CM | POA: Diagnosis not present

## 2013-09-26 DIAGNOSIS — I781 Nevus, non-neoplastic: Secondary | ICD-10-CM | POA: Diagnosis not present

## 2013-09-26 DIAGNOSIS — L919 Hypertrophic disorder of the skin, unspecified: Secondary | ICD-10-CM | POA: Diagnosis not present

## 2013-09-26 DIAGNOSIS — L821 Other seborrheic keratosis: Secondary | ICD-10-CM | POA: Diagnosis not present

## 2013-09-27 DIAGNOSIS — M25579 Pain in unspecified ankle and joints of unspecified foot: Secondary | ICD-10-CM | POA: Diagnosis not present

## 2013-10-18 DIAGNOSIS — M4120 Other idiopathic scoliosis, site unspecified: Secondary | ICD-10-CM | POA: Diagnosis not present

## 2013-10-18 DIAGNOSIS — M47812 Spondylosis without myelopathy or radiculopathy, cervical region: Secondary | ICD-10-CM | POA: Diagnosis not present

## 2013-10-19 DIAGNOSIS — N3946 Mixed incontinence: Secondary | ICD-10-CM | POA: Diagnosis not present

## 2013-10-19 DIAGNOSIS — E782 Mixed hyperlipidemia: Secondary | ICD-10-CM | POA: Diagnosis not present

## 2013-10-19 DIAGNOSIS — E559 Vitamin D deficiency, unspecified: Secondary | ICD-10-CM | POA: Diagnosis not present

## 2013-10-19 DIAGNOSIS — R5381 Other malaise: Secondary | ICD-10-CM | POA: Diagnosis not present

## 2013-10-19 DIAGNOSIS — R7989 Other specified abnormal findings of blood chemistry: Secondary | ICD-10-CM | POA: Diagnosis not present

## 2013-10-19 DIAGNOSIS — D509 Iron deficiency anemia, unspecified: Secondary | ICD-10-CM | POA: Diagnosis not present

## 2013-10-24 DIAGNOSIS — M545 Low back pain, unspecified: Secondary | ICD-10-CM | POA: Diagnosis not present

## 2013-10-24 DIAGNOSIS — M47812 Spondylosis without myelopathy or radiculopathy, cervical region: Secondary | ICD-10-CM | POA: Diagnosis not present

## 2013-10-26 DIAGNOSIS — D239 Other benign neoplasm of skin, unspecified: Secondary | ICD-10-CM | POA: Diagnosis not present

## 2013-10-26 DIAGNOSIS — M25569 Pain in unspecified knee: Secondary | ICD-10-CM | POA: Diagnosis not present

## 2013-10-26 DIAGNOSIS — L819 Disorder of pigmentation, unspecified: Secondary | ICD-10-CM | POA: Diagnosis not present

## 2013-10-26 DIAGNOSIS — D1801 Hemangioma of skin and subcutaneous tissue: Secondary | ICD-10-CM | POA: Diagnosis not present

## 2013-10-26 DIAGNOSIS — L821 Other seborrheic keratosis: Secondary | ICD-10-CM | POA: Diagnosis not present

## 2013-11-07 DIAGNOSIS — M47812 Spondylosis without myelopathy or radiculopathy, cervical region: Secondary | ICD-10-CM | POA: Diagnosis not present

## 2013-11-07 DIAGNOSIS — M545 Low back pain, unspecified: Secondary | ICD-10-CM | POA: Diagnosis not present

## 2013-11-08 DIAGNOSIS — M25579 Pain in unspecified ankle and joints of unspecified foot: Secondary | ICD-10-CM | POA: Diagnosis not present

## 2013-11-09 DIAGNOSIS — M545 Low back pain, unspecified: Secondary | ICD-10-CM | POA: Diagnosis not present

## 2013-11-09 DIAGNOSIS — M47812 Spondylosis without myelopathy or radiculopathy, cervical region: Secondary | ICD-10-CM | POA: Diagnosis not present

## 2013-11-21 DIAGNOSIS — M4120 Other idiopathic scoliosis, site unspecified: Secondary | ICD-10-CM | POA: Diagnosis not present

## 2013-11-22 DIAGNOSIS — N3946 Mixed incontinence: Secondary | ICD-10-CM | POA: Diagnosis not present

## 2014-01-01 ENCOUNTER — Other Ambulatory Visit: Payer: Self-pay

## 2014-01-01 DIAGNOSIS — Z1231 Encounter for screening mammogram for malignant neoplasm of breast: Secondary | ICD-10-CM

## 2014-01-03 DIAGNOSIS — H5200 Hypermetropia, unspecified eye: Secondary | ICD-10-CM | POA: Diagnosis not present

## 2014-01-03 DIAGNOSIS — H2510 Age-related nuclear cataract, unspecified eye: Secondary | ICD-10-CM | POA: Diagnosis not present

## 2014-01-09 ENCOUNTER — Ambulatory Visit
Admission: RE | Admit: 2014-01-09 | Discharge: 2014-01-09 | Disposition: A | Discharge status: Home / Self Care | Payer: Medicare Other | Source: Ambulatory Visit

## 2014-01-09 DIAGNOSIS — Z1231 Encounter for screening mammogram for malignant neoplasm of breast: Secondary | ICD-10-CM

## 2014-01-17 DIAGNOSIS — E782 Mixed hyperlipidemia: Secondary | ICD-10-CM | POA: Diagnosis not present

## 2014-01-17 DIAGNOSIS — N3946 Mixed incontinence: Secondary | ICD-10-CM | POA: Diagnosis not present

## 2014-01-24 DIAGNOSIS — R197 Diarrhea, unspecified: Secondary | ICD-10-CM | POA: Diagnosis not present

## 2014-01-31 DIAGNOSIS — E559 Vitamin D deficiency, unspecified: Secondary | ICD-10-CM | POA: Diagnosis not present

## 2014-01-31 DIAGNOSIS — N3946 Mixed incontinence: Secondary | ICD-10-CM | POA: Diagnosis not present

## 2014-01-31 DIAGNOSIS — R7989 Other specified abnormal findings of blood chemistry: Secondary | ICD-10-CM | POA: Diagnosis not present

## 2014-01-31 DIAGNOSIS — E782 Mixed hyperlipidemia: Secondary | ICD-10-CM | POA: Diagnosis not present

## 2014-02-07 DIAGNOSIS — R5383 Other fatigue: Secondary | ICD-10-CM | POA: Diagnosis not present

## 2014-02-07 DIAGNOSIS — R5381 Other malaise: Secondary | ICD-10-CM | POA: Diagnosis not present

## 2014-02-08 DIAGNOSIS — M25579 Pain in unspecified ankle and joints of unspecified foot: Secondary | ICD-10-CM | POA: Diagnosis not present

## 2014-02-28 DIAGNOSIS — E782 Mixed hyperlipidemia: Secondary | ICD-10-CM | POA: Diagnosis not present

## 2014-03-11 DIAGNOSIS — L255 Unspecified contact dermatitis due to plants, except food: Secondary | ICD-10-CM | POA: Diagnosis not present

## 2014-03-14 ENCOUNTER — Telehealth: Payer: Self-pay | Admitting: Family Medicine

## 2014-03-14 ENCOUNTER — Encounter: Payer: Self-pay | Admitting: Medical

## 2014-03-14 ENCOUNTER — Ambulatory Visit (INDEPENDENT_AMBULATORY_CARE_PROVIDER_SITE_OTHER): Payer: Medicare Other | Admitting: Medical

## 2014-03-14 ENCOUNTER — Other Ambulatory Visit: Payer: Self-pay

## 2014-03-14 VITALS — HR 63 | Temp 98.3°F | Ht 65.1 in | Wt 151.4 lb

## 2014-03-14 DIAGNOSIS — L089 Local infection of the skin and subcutaneous tissue, unspecified: Secondary | ICD-10-CM | POA: Diagnosis not present

## 2014-03-14 DIAGNOSIS — M25529 Pain in unspecified elbow: Secondary | ICD-10-CM | POA: Diagnosis not present

## 2014-03-14 DIAGNOSIS — T7840XA Allergy, unspecified, initial encounter: Secondary | ICD-10-CM | POA: Diagnosis not present

## 2014-03-14 MED ORDER — CEFTRIAXONE SODIUM 1 G IJ SOLR
1.0000 g | Freq: Once | INTRAMUSCULAR | Status: AC
  Administered 2014-03-14: 1 g via INTRAMUSCULAR

## 2014-03-14 MED ORDER — METHYLPREDNISOLONE ACETATE 80 MG/ML IJ SUSP: 80.0000 mg | Freq: Once | INTRAMUSCULAR | Status: DC

## 2014-03-14 MED ORDER — DOXYCYCLINE HYCLATE 100 MG PO TABS: 100.0000 mg | ORAL_TABLET | Freq: Two times a day (BID) | ORAL | Status: DC

## 2014-03-14 MED ORDER — HYDROXYZINE HCL 10 MG PO TABS: 10.0000 mg | ORAL_TABLET | Freq: Three times a day (TID) | ORAL | Status: DC | PRN

## 2014-03-14 MED ORDER — PREDNISONE 20 MG PO TABS: ORAL_TABLET | ORAL | Status: DC

## 2014-03-14 MED ORDER — TRAMADOL HCL 50 MG PO TABS: 50.0000 mg | ORAL_TABLET | Freq: Four times a day (QID) | ORAL | Status: DC | PRN

## 2014-03-14 NOTE — Assessment & Plan Note (Signed)
Allergic reaction to reported poison oak. [place] treatment was on [place] but she is not improving.  Decided to give her some 80 mg of Depo-Medrol IM. Also gave her a tapered prednisone Rx for the next 6 days. In addition he or hydroxyzine prescription for itching.  She has a moderate severe case of allergic reaction with some secondary infection. So I do want to see her on Friday afternoon before the weekend to make sure she has some improvement.

## 2014-03-14 NOTE — Patient Instructions (Signed)
For your allergic reaction to poison ivy, we gave you depomedrol 80 mg im and tapered dose of prednisone.  I also prescribed hydroxyzine for itching  For your secondary skin infection that has worsened despite use of bacrim, we gave rocephin 1 gram im and I prescribed doxycycline. Please stop bactrim.   I prescribed tramadol for pain.  Follow up on Friday afternoon or as needed. I want to see area before weekend.  We also did wound culture today as well.

## 2014-03-14 NOTE — Progress Notes (Signed)
Subjective:    Patient ID: Kylie Wheeler, female    DOB: [birth year], 68 y.o.   MRN: [id number]  HPI   Pt in with recent exposure to poison oak. She had exposure last [place] Pt went to urgent care on Sunday. Got steroid injection Im and told to get topical hydrocortisone. Area was small at time UC saw. Pt was also placed on bactrim.  Area is worsening instead of improving. She reports no fever or chills. The surface area the blisters at the time that the urgent care sore was minimal.  She is not improving despite the steroid injection and Bactrim. She reports no shortness of breath or wheezing. She is not diabetic and she reports no history of stomach ulcers.  Past Medical History  Diagnosis Date  . Migraines   . Hyperlipidemia   . Vitamin D deficiency   . Osteoporosis   . Insomnia   . Bladder problem     History   Social History  . Marital Status: Widowed    Spouse Name: N/A    Number of Children: N/A  . Years of Education: N/A   Occupational History  . retired-- IT trainer    Social History Main Topics  . Smoking status: Never Smoker   . Smokeless tobacco: Never Used  . Alcohol Use: No  . Drug Use: No  . Sexual Activity: Not Currently    Partners: Male     Comment: pt actually said whether she is sexually active or not is none of the govt business   Other Topics Concern  . Not on file   Social History Narrative  . No narrative on file    Past Surgical History  Procedure Laterality Date  . Tubal ligation    . Adenoidectomy      Family History  Problem Relation Age of Onset  . Stroke Mother   . Heart attack Mother   . Atrial fibrillation Mother   . Heart failure Mother   . Stroke Father   . Heart failure Father   . Hyperlipidemia Brother   . Hypertension Brother   . Heart disease Maternal Grandfather   . Heart disease Paternal Grandmother   . Heart disease  Paternal Grandfather   . Hyperlipidemia Sister   . Hyperlipidemia Brother   . Hypertension Brother     Allergies  Allergen Reactions  . Codeine Nausea Only    REACTION: sick on stomach    Current Outpatient Prescriptions on File Prior to Visit  Medication Sig Dispense Refill  . alendronate (FOSAMAX) 35 MG tablet Take 35 mg by mouth every 7 (seven) days. Take with a full glass of water on an empty stomach.      . clorazepate (TRANXENE) 7.5 MG tablet TAKE 1/2 TABLET BY MOUTH AT BEDTIME AS NEEDED FOR MIGRAINES  30 tablet  1  . naratriptan (AMERGE) 2.5 MG tablet Take 2.5 mg by mouth daily as needed for migraine. Take one (1) tablet at onset of headache; if returns or does not resolve, may repeat after 4 hours; do not exceed five (5) mg in 24 hours.      . Aspirin-Acetaminophen-Caffeine (EXCEDRIN PO) Take by mouth. TAKE ONE TABLET AS NEEDED FOR MIGRAINES      . atorvastatin (LIPITOR) 40 MG tablet        No current facility-administered medications on file prior to visit.  Pulse 63  Temp(Src) 98.3 F (36.8 C) (Oral)  Ht 5' 5.1" (1.654 m)  Wt 151 lb 6.4 oz (68.675 kg)  BMI 25.10 kg/m2  SpO2 98%  Kylie Wheeler if    Review of Systems  Constitutional: Negative for fever, chills and fatigue.  HENT: Negative.   Respiratory: Negative for cough, chest tightness, shortness of breath and wheezing.   Cardiovascular: Negative for chest pain and palpitations.  Gastrointestinal: Negative.   Musculoskeletal:       Some pain in both arms over the areas of blisters and probable secondary infection.  Skin:       Scattered blisters on both forearms after exposure to poison oak. Some on her abdomen as well. These areas do itch moderately.  Hematological: Negative for adenopathy. Does not bruise/bleed easily.       Objective:   Physical Exam  General- no acute distress Lungs-clear even and unlabored.  Heart-regular rate and rhythm Skin-Large area of blistering over her forearms.(  The  numerous blisters themselves are about 10 mm in diameter each but do cover an area about 12 cm x 6 cm both forearms). She has scattered large blisters all over both forearms. The left forearm upper portion has honey crusted appearance around the blisters.  Some mild red area on her abdomen. But around her abdomen she does not have many blisters at all.       Assessment & Plan:

## 2014-03-14 NOTE — Telephone Encounter (Signed)
Caller name: [name] Relation to pt: self Call back number: [phone] Pharmacy:  Reason for call:   Patient states that [facility] does not have the medication in stock that was called in earlier. She would like to know if something else could be called in or could we call that medication in to a different pharmacy? Patient states that she needs this medication as soon as possible as she is in pain.

## 2014-03-14 NOTE — Assessment & Plan Note (Signed)
Pain is from the blisters and secondary infection. She describes moderate to severe pain and this does look uncomfortable. So I did prescribe her some tramadol.

## 2014-03-14 NOTE — Telephone Encounter (Signed)
Discontinued order to pharmacy for Depo- Medrol 80 mg.   Patient given injection in office. Called in order for Tramadol 50 mg #30 mq6hrs /prn 0 RF to [facility] per patient request.

## 2014-03-14 NOTE — Assessment & Plan Note (Signed)
She is secondary skin infection of the poison oak breakout regions. The right upper forearm looks to be the most likely infected region. She did not improve and Bactrim so I did get a wound culture today. I asked her stop Bactrim and wrote a prescription for doxycycline. In addition she was given Rocephin 1 g IM today. Wound culture is pending.

## 2014-03-16 ENCOUNTER — Ambulatory Visit (INDEPENDENT_AMBULATORY_CARE_PROVIDER_SITE_OTHER): Payer: Medicare Other | Admitting: Medical

## 2014-03-16 ENCOUNTER — Ambulatory Visit: Payer: Medicare Other | Admitting: Medical

## 2014-03-16 ENCOUNTER — Telehealth: Payer: Self-pay | Admitting: Medical

## 2014-03-16 ENCOUNTER — Encounter: Payer: Self-pay | Admitting: Medical

## 2014-03-16 VITALS — HR 86 | Temp 98.2°F | Wt 152.0 lb

## 2014-03-16 DIAGNOSIS — T7840XD Allergy, unspecified, subsequent encounter: Secondary | ICD-10-CM

## 2014-03-16 DIAGNOSIS — L089 Local infection of the skin and subcutaneous tissue, unspecified: Secondary | ICD-10-CM

## 2014-03-16 MED ORDER — PREDNISONE 20 MG PO TABS: 20.0000 mg | ORAL_TABLET | Freq: Every day | ORAL | Status: DC

## 2014-03-16 MED ORDER — HYDROXYZINE HCL 25 MG PO TABS: 25.0000 mg | ORAL_TABLET | Freq: Three times a day (TID) | ORAL | Status: DC | PRN

## 2014-03-16 NOTE — Assessment & Plan Note (Signed)
Improved. Continue prednisone and hydroxyzine. SEE avs.

## 2014-03-16 NOTE — Progress Notes (Signed)
Subjective:    Patient ID: Kylie Wheeler, female    DOB: March 28, 1946, 68 y.o.   MRN: [id number]  HPI  Pt in for follow up on her diffuse allergic rxn poison oak.  Her arms are still itching but they do look better. Pt did get doxycyxline.(She had 2 full day of antibiotic) Pt is on prednisone and  hydroxyzine.   Past Medical History  Diagnosis Date  . Migraines   . Hyperlipidemia   . Vitamin D deficiency   . Osteoporosis   . Insomnia   . Bladder problem     History   Social History  . Marital Status: Widowed    Spouse Name: N/A    Number of Children: N/A  . Years of Education: N/A   Occupational History  . retired-- IT trainer    Social History Main Topics  . Smoking status: Never Smoker   . Smokeless tobacco: Never Used  . Alcohol Use: No  . Drug Use: No  . Sexual Activity: Not Currently    Partners: Male     Comment: pt actually said whether she is sexually active or not is none of the govt business   Other Topics Concern  . Not on file   Social History Narrative  . No narrative on file    Past Surgical History  Procedure Laterality Date  . Tubal ligation    . Adenoidectomy      Family History  Problem Relation Age of Onset  . Stroke Mother   . Heart attack Mother   . Atrial fibrillation Mother   . Heart failure Mother   . Stroke Father   . Heart failure Father   . Hyperlipidemia Brother   . Hypertension Brother   . Heart disease Maternal Grandfather   . Heart disease Paternal Grandmother   . Heart disease Paternal Grandfather   . Hyperlipidemia Sister   . Hyperlipidemia Brother   . Hypertension Brother     Allergies  Allergen Reactions  . Codeine Nausea Only    REACTION: sick on stomach    Current Outpatient Prescriptions on File Prior to Visit  Medication Sig Dispense Refill  . alendronate (FOSAMAX) 35 MG tablet Take 35 mg by mouth every 7 (seven) days. Take with a full glass of water on an empty stomach.      . clorazepate  (TRANXENE) 7.5 MG tablet TAKE 1/2 TABLET BY MOUTH AT BEDTIME AS NEEDED FOR MIGRAINES  30 tablet  1  . doxycycline (VIBRA-TABS) 100 MG tablet Take 1 tablet (100 mg total) by mouth 2 (two) times daily.  20 tablet  0  . hydrOXYzine (ATARAX/VISTARIL) 10 MG tablet Take 1 tablet (10 mg total) by mouth 3 (three) times daily as needed for itching.  30 tablet  0  . methylPREDNISolone acetate (DEPO-MEDROL) 80 MG/ML injection Inject 1 mL (80 mg total) into the muscle once.  5 mL  0  . naratriptan (AMERGE) 2.5 MG tablet Take 2.5 mg by mouth daily as needed for migraine. Take one (1) tablet at onset of headache; if returns or does not resolve, may repeat after 4 hours; do not exceed five (5) mg in 24 hours.      . predniSONE (DELTASONE) 20 MG tablet 1 tab po tid po day 1 and day 2, then 1 tab po bid day 3 and day 4, then 1 tab po q day 5 and day 6.  12 tablet  0  . traMADol (ULTRAM) 50 MG tablet Take  1 tablet (50 mg total) by mouth every 6 (six) hours as needed for moderate pain.  30 tablet  0  . Aspirin-Acetaminophen-Caffeine (EXCEDRIN PO) Take by mouth. TAKE ONE TABLET AS NEEDED FOR MIGRAINES      . atorvastatin (LIPITOR) 40 MG tablet       . sulfamethoxazole-trimethoprim (BACTRIM DS) 800-160 MG per tablet Take 1 tablet by mouth 2 (two) times daily.       No current facility-administered medications on file prior to visit.    Pulse 86  Temp(Src) 98.2 F (36.8 C) (Oral)  Wt 152 lb (68.947 kg)  SpO2 99%      Review of Systems  Constitutional: Negative for fever, chills and fatigue.  HENT: Negative.   Respiratory: Negative for cough, chest tightness, shortness of breath and wheezing.   Cardiovascular: Negative for chest pain and palpitations.  Gastrointestinal: Negative.   Musculoskeletal:       Some pain in both arms over the areas of blisters and probable secondary infection.  The areas feel better with less pain.  Skin:       Scattered blisters on both forearms after exposure to poison oak.  Some on her abdomen as well. These areas do itch moderately.  The areas are less prominent today and less painful. Still has some itching.  Hematological: Negative for adenopathy. Does not bruise/bleed easily.       Objective:   Physical Exam  General- no acute distress, pleasant pt. Lungs- CTA. Heart- RRR Skin- left forearm blisters and red area looks bout 30% improvement in reduction of blisters and surface area of rash. About 50% reduction rt forearm. Her stomach looks better. Small faint red rash left cheek.        Assessment & Plan:

## 2014-03-16 NOTE — Patient Instructions (Signed)
Continue current treatment.  Use hydroxyzine 10 mg tab (20 mg every 8 hrs for itching.) When you run out of 10 mg tabs then use new hydroxyzine rx 25 mg every 8 hrs as needed itching.  After you use current taper prednisone, I gave you additional 4 tabs of 20 mg to use one tab every day.  Continue antibiotic.  Call me on wed. If not significantly improved will refer to dermatologist.

## 2014-03-16 NOTE — Assessment & Plan Note (Signed)
Improved. Continue doxycycline.

## 2014-03-16 NOTE — Telephone Encounter (Signed)
Pt did get the antibiotic. LPN talked with pt. She will come in today.

## 2014-03-17 LAB — WOUND CULTURE
GRAM STAIN: NONE SEEN
Gram Stain: NONE SEEN
ORGANISM ID, BACTERIA: NO GROWTH

## 2014-03-20 DIAGNOSIS — L304 Erythema intertrigo: Secondary | ICD-10-CM | POA: Diagnosis not present

## 2014-03-20 DIAGNOSIS — L259 Unspecified contact dermatitis, unspecified cause: Secondary | ICD-10-CM | POA: Diagnosis not present

## 2014-03-30 ENCOUNTER — Other Ambulatory Visit: Payer: Self-pay

## 2014-04-13 DIAGNOSIS — G43909 Migraine, unspecified, not intractable, without status migrainosus: Secondary | ICD-10-CM | POA: Diagnosis not present

## 2014-04-13 DIAGNOSIS — M810 Age-related osteoporosis without current pathological fracture: Secondary | ICD-10-CM | POA: Diagnosis not present

## 2014-04-13 DIAGNOSIS — Z1389 Encounter for screening for other disorder: Secondary | ICD-10-CM | POA: Diagnosis not present

## 2014-04-13 DIAGNOSIS — E559 Vitamin D deficiency, unspecified: Secondary | ICD-10-CM | POA: Diagnosis not present

## 2014-04-13 DIAGNOSIS — E780 Pure hypercholesterolemia: Secondary | ICD-10-CM | POA: Diagnosis not present

## 2014-04-13 DIAGNOSIS — Z0000 Encounter for general adult medical examination without abnormal findings: Secondary | ICD-10-CM | POA: Diagnosis not present

## 2014-04-17 ENCOUNTER — Other Ambulatory Visit: Payer: Self-pay | Admitting: Family Medicine

## 2014-04-17 DIAGNOSIS — M810 Age-related osteoporosis without current pathological fracture: Secondary | ICD-10-CM

## 2014-04-20 DIAGNOSIS — M79672 Pain in left foot: Secondary | ICD-10-CM | POA: Diagnosis not present

## 2014-04-27 DIAGNOSIS — M2042 Other hammer toe(s) (acquired), left foot: Secondary | ICD-10-CM | POA: Diagnosis not present

## 2014-04-27 DIAGNOSIS — M2012 Hallux valgus (acquired), left foot: Secondary | ICD-10-CM | POA: Diagnosis not present

## 2014-05-01 DIAGNOSIS — M545 Low back pain: Secondary | ICD-10-CM | POA: Diagnosis not present

## 2014-05-14 ENCOUNTER — Other Ambulatory Visit (HOSPITAL_COMMUNITY)
Admission: RE | Admit: 2014-05-14 | Discharge: 2014-05-14 | Disposition: A | Discharge status: Home / Self Care | Payer: Medicare Other | Source: Ambulatory Visit | Attending: Family Medicine | Admitting: Family Medicine

## 2014-05-14 ENCOUNTER — Other Ambulatory Visit: Payer: Self-pay | Admitting: Family Medicine

## 2014-05-14 DIAGNOSIS — E559 Vitamin D deficiency, unspecified: Secondary | ICD-10-CM | POA: Diagnosis not present

## 2014-05-14 DIAGNOSIS — E780 Pure hypercholesterolemia: Secondary | ICD-10-CM | POA: Diagnosis not present

## 2014-05-14 DIAGNOSIS — Z124 Encounter for screening for malignant neoplasm of cervix: Secondary | ICD-10-CM | POA: Diagnosis not present

## 2014-05-14 DIAGNOSIS — R42 Dizziness and giddiness: Secondary | ICD-10-CM | POA: Diagnosis not present

## 2014-05-14 DIAGNOSIS — N6459 Other signs and symptoms in breast: Secondary | ICD-10-CM | POA: Diagnosis not present

## 2014-05-15 LAB — CYTOLOGY - PAP

## 2014-05-16 ENCOUNTER — Other Ambulatory Visit: Payer: Self-pay | Admitting: Family Medicine

## 2014-05-16 DIAGNOSIS — N6459 Other signs and symptoms in breast: Secondary | ICD-10-CM

## 2014-05-31 ENCOUNTER — Ambulatory Visit
Admission: RE | Admit: 2014-05-31 | Discharge: 2014-05-31 | Disposition: A | Discharge status: Home / Self Care | Payer: Medicare Other | Source: Ambulatory Visit | Attending: Family Medicine | Admitting: Family Medicine

## 2014-05-31 DIAGNOSIS — N6459 Other signs and symptoms in breast: Secondary | ICD-10-CM

## 2014-07-30 DIAGNOSIS — N3946 Mixed incontinence: Secondary | ICD-10-CM | POA: Diagnosis not present

## 2014-07-31 ENCOUNTER — Emergency Department (HOSPITAL_COMMUNITY): Payer: Medicare Other

## 2014-07-31 ENCOUNTER — Emergency Department (HOSPITAL_COMMUNITY)
Admission: EM | Admit: 2014-07-31 | Discharge: 2014-07-31 | Disposition: A | Discharge status: Home / Self Care | Payer: Medicare Other | Attending: Emergency Medicine | Admitting: Emergency Medicine

## 2014-07-31 ENCOUNTER — Encounter (HOSPITAL_COMMUNITY): Payer: Self-pay

## 2014-07-31 DIAGNOSIS — Z7952 Long term (current) use of systemic steroids: Secondary | ICD-10-CM | POA: Diagnosis not present

## 2014-07-31 DIAGNOSIS — M810 Age-related osteoporosis without current pathological fracture: Secondary | ICD-10-CM | POA: Diagnosis not present

## 2014-07-31 DIAGNOSIS — Z79899 Other long term (current) drug therapy: Secondary | ICD-10-CM | POA: Insufficient documentation

## 2014-07-31 DIAGNOSIS — Z792 Long term (current) use of antibiotics: Secondary | ICD-10-CM | POA: Diagnosis not present

## 2014-07-31 DIAGNOSIS — R110 Nausea: Secondary | ICD-10-CM | POA: Diagnosis not present

## 2014-07-31 DIAGNOSIS — R1032 Left lower quadrant pain: Secondary | ICD-10-CM | POA: Insufficient documentation

## 2014-07-31 DIAGNOSIS — G43909 Migraine, unspecified, not intractable, without status migrainosus: Secondary | ICD-10-CM | POA: Insufficient documentation

## 2014-07-31 DIAGNOSIS — R109 Unspecified abdominal pain: Secondary | ICD-10-CM | POA: Diagnosis not present

## 2014-07-31 DIAGNOSIS — R7989 Other specified abnormal findings of blood chemistry: Secondary | ICD-10-CM | POA: Insufficient documentation

## 2014-07-31 DIAGNOSIS — Z9851 Tubal ligation status: Secondary | ICD-10-CM | POA: Diagnosis not present

## 2014-07-31 DIAGNOSIS — R1031 Right lower quadrant pain: Secondary | ICD-10-CM | POA: Diagnosis not present

## 2014-07-31 DIAGNOSIS — R350 Frequency of micturition: Secondary | ICD-10-CM | POA: Diagnosis not present

## 2014-07-31 DIAGNOSIS — G4700 Insomnia, unspecified: Secondary | ICD-10-CM | POA: Diagnosis not present

## 2014-07-31 DIAGNOSIS — E785 Hyperlipidemia, unspecified: Secondary | ICD-10-CM | POA: Insufficient documentation

## 2014-07-31 LAB — COMPREHENSIVE METABOLIC PANEL
ALBUMIN: 4.5 g/dL (ref 3.5–5.2)
ALT: 24 U/L (ref 0–35)
AST: 36 U/L (ref 0–37)
Alkaline Phosphatase: 47 U/L (ref 39–117)
Anion gap: 11 (ref 5–15)
BUN: 30 mg/dL — ABNORMAL HIGH (ref 6–23)
CALCIUM: 9.2 mg/dL (ref 8.4–10.5)
CO2: 22 mmol/L (ref 19–32)
Chloride: 103 mmol/L (ref 96–112)
Creatinine, Ser: 2.08 mg/dL — ABNORMAL HIGH (ref 0.50–1.10)
GFR calc non Af Amer: 23 mL/min — ABNORMAL LOW (ref >90)
GFR, EST AFRICAN AMERICAN: 27 mL/min — AB (ref >90)
Glucose, Bld: 103 mg/dL — ABNORMAL HIGH (ref 70–99)
POTASSIUM: 4.2 mmol/L (ref 3.5–5.1)
Sodium: 136 mmol/L (ref 135–145)
TOTAL PROTEIN: 7.3 g/dL (ref 6.0–8.3)
Total Bilirubin: 1.2 mg/dL (ref 0.3–1.2)

## 2014-07-31 LAB — CBC WITH DIFFERENTIAL/PLATELET
BASOS ABS: 0 10*3/uL (ref 0.0–0.1)
Basophils Relative: 0 % (ref 0–1)
Eosinophils Absolute: 0 10*3/uL (ref 0.0–0.7)
Eosinophils Relative: 0 % (ref 0–5)
HCT: 42.5 % (ref 36.0–46.0)
HEMOGLOBIN: 13.9 g/dL (ref 12.0–15.0)
Lymphocytes Relative: 10 % — ABNORMAL LOW (ref 12–46)
Lymphs Abs: 0.8 10*3/uL (ref 0.7–4.0)
MCH: 31 pg (ref 26.0–34.0)
MCHC: 32.7 g/dL (ref 30.0–36.0)
MCV: 94.9 fL (ref 78.0–100.0)
Monocytes Absolute: 0.7 10*3/uL (ref 0.1–1.0)
Monocytes Relative: 9 % (ref 3–12)
NEUTROS ABS: 6.5 10*3/uL (ref 1.7–7.7)
NEUTROS PCT: 81 % — AB (ref 43–77)
PLATELETS: 208 10*3/uL (ref 150–400)
RBC: 4.48 MIL/uL (ref 3.87–5.11)
RDW: 12.1 % (ref 11.5–15.5)
WBC: 8 10*3/uL (ref 4.0–10.5)

## 2014-07-31 LAB — URINALYSIS, ROUTINE W REFLEX MICROSCOPIC
Bilirubin Urine: NEGATIVE
Glucose, UA: NEGATIVE mg/dL
KETONES UR: NEGATIVE mg/dL
Leukocytes, UA: NEGATIVE
Nitrite: NEGATIVE
Specific Gravity, Urine: 1.009 (ref 1.005–1.030)
UROBILINOGEN UA: 0.2 mg/dL (ref 0.0–1.0)
pH: 6.5 (ref 5.0–8.0)

## 2014-07-31 LAB — URINE MICROSCOPIC-ADD ON

## 2014-07-31 LAB — LIPASE, BLOOD: Lipase: 23 U/L (ref 11–59)

## 2014-07-31 MED ORDER — ONDANSETRON HCL 4 MG/2ML IJ SOLN
4.0000 mg | Freq: Once | INTRAMUSCULAR | Status: AC
  Administered 2014-07-31: 4 mg via INTRAVENOUS
  Filled 2014-07-31: qty 2

## 2014-07-31 MED ORDER — TRAMADOL HCL 50 MG PO TABS: 50.0000 mg | ORAL_TABLET | Freq: Four times a day (QID) | ORAL | Status: DC | PRN

## 2014-07-31 MED ORDER — SODIUM CHLORIDE 0.9 % IV BOLUS (SEPSIS)
1000.0000 mL | Freq: Once | INTRAVENOUS | Status: AC
Start: 2014-07-31 — End: 2014-07-31
  Administered 2014-07-31: 1000 mL via INTRAVENOUS

## 2014-07-31 NOTE — ED Notes (Signed)
Pt presents with c/o bilateral flank pain. Pt reports the pain started in her stomach and is now in both flank areas. Pt has urinary frequency but that is a normal problem for patient. Pt denies any hematuria or hx of kidney stones.

## 2014-07-31 NOTE — Discharge Instructions (Signed)
Call for a follow up appointment with a Family or Primary Care Provider for further evaluation of your flank pian, abnormal CT scan and elevated Creatine levels. Return if Symptoms worsen.   Take medication as prescribed.

## 2014-07-31 NOTE — ED Notes (Signed)
Patient transported to CT 

## 2014-07-31 NOTE — ED Provider Notes (Signed)
CSN: [id number]     Arrival date & time 07/31/14  1656 History   First MD Initiated Contact with Patient 07/31/14 1921     Chief Complaint  Patient presents with  . Flank Pain     (Consider location/radiation/quality/duration/timing/severity/associated sxs/prior Treatment) HPI Comments: The patient is a 69 year old female with a past medical history of bladder problems (frequency), migraines, hyperlipidemia presents emergency room chief complaint of bilateral pain since today. Patient reports bilateral flank pain with radiation into bilateral abdomen. She reports associated nausea without emesis. Patient reports last bowel movement today, normal denies diarrhea or constipation. Patient denies dysuria, hematuria she reports chronic frequency, unchanged. Denies aggravating or relieving symptoms. Discomfort not worsened with body position or palpation of back. Patient denies fever. No history of renal calculi.  PCP: Leighton Ruff  Patient is a 69 y.o. female presenting with flank pain. The history is provided by the patient. No language interpreter was used.  Flank Pain Associated symptoms include abdominal pain and nausea. Pertinent negatives include no chest pain, chills, fever or vomiting.    Past Medical History  Diagnosis Date  . Migraines   . Hyperlipidemia   . Vitamin D deficiency   . Osteoporosis   . Insomnia   . Bladder problem    Past Surgical History  Procedure Laterality Date  . Tubal ligation    . Adenoidectomy     Family History  Problem Relation Age of Onset  . Stroke Mother   . Heart attack Mother   . Atrial fibrillation Mother   . Heart failure Mother   . Stroke Father   . Heart failure Father   . Hyperlipidemia Brother   . Hypertension Brother   . Heart disease Maternal Grandfather   . Heart disease Paternal Grandmother   . Heart disease Paternal Grandfather   . Hyperlipidemia Sister   . Hyperlipidemia Brother   . Hypertension Brother    History   Substance Use Topics  . Smoking status: Never Smoker   . Smokeless tobacco: Never Used  . Alcohol Use: No   OB History    No data available     Review of Systems  Constitutional: Negative for fever and chills.  Respiratory: Negative for shortness of breath.   Cardiovascular: Negative for chest pain.  Gastrointestinal: Positive for nausea and abdominal pain. Negative for vomiting, diarrhea and constipation.  Genitourinary: Positive for flank pain. Negative for dysuria, urgency, frequency and hematuria.  Musculoskeletal: Negative for back pain.      Allergies  Codeine  Home Medications   Prior to Admission medications   Medication Sig Start Date End Date Taking? Authorizing Provider  alendronate (FOSAMAX) 35 MG tablet Take 35 mg by mouth every 7 (seven) days. Take with a full glass of water on an empty stomach.    Historical Provider, MD  Aspirin-Acetaminophen-Caffeine (EXCEDRIN PO) Take by mouth. TAKE ONE TABLET AS NEEDED FOR MIGRAINES    Historical Provider, MD  atorvastatin (LIPITOR) 40 MG tablet  07/04/13   Historical Provider, MD  clorazepate (TRANXENE) 7.5 MG tablet TAKE 1/2 TABLET BY MOUTH AT BEDTIME AS NEEDED FOR MIGRAINES 07/21/13   Rosalita Chessman, DO  doxycycline (VIBRA-TABS) 100 MG tablet Take 1 tablet (100 mg total) by mouth 2 (two) times daily. 03/14/14   Everest, PA-C  hydrOXYzine (ATARAX/VISTARIL) 10 MG tablet Take 1 tablet (10 mg total) by mouth 3 (three) times daily as needed for itching. 03/14/14   Meriam Sprague Saguier, PA-C  hydrOXYzine (ATARAX/VISTARIL) 25 MG  tablet Take 1 tablet (25 mg total) by mouth 3 (three) times daily as needed for itching. 03/16/14   Meriam Sprague Saguier, PA-C  methylPREDNISolone acetate (DEPO-MEDROL) 80 MG/ML injection Inject 1 mL (80 mg total) into the muscle once. 03/14/14   Meriam Sprague Saguier, PA-C  naratriptan (AMERGE) 2.5 MG tablet Take 2.5 mg by mouth daily as needed for migraine. Take one (1) tablet at onset of headache; if returns or  does not resolve, may repeat after 4 hours; do not exceed five (5) mg in 24 hours.    Historical Provider, MD  predniSONE (DELTASONE) 20 MG tablet 1 tab po tid po day 1 and day 2, then 1 tab po bid day 3 and day 4, then 1 tab po q day 5 and day 6. 03/14/14   Meriam Sprague Saguier, PA-C  predniSONE (DELTASONE) 20 MG tablet Take 1 tablet (20 mg total) by mouth daily with breakfast. 03/16/14   Meriam Sprague Saguier, PA-C  sulfamethoxazole-trimethoprim (BACTRIM DS) 800-160 MG per tablet Take 1 tablet by mouth 2 (two) times daily.    Historical Provider, MD  traMADol (ULTRAM) 50 MG tablet Take 1 tablet (50 mg total) by mouth every 6 (six) hours as needed for moderate pain. 03/14/14   Meriam Sprague Saguier, PA-C   BP 163/79 mmHg  Pulse 89  Temp(Src) 98.1 F (36.7 C) (Oral)  Resp 16  SpO2 98% Physical Exam  Constitutional: She is oriented to person, place, and time. She appears well-developed and well-nourished. No distress.  HENT:  Head: Normocephalic and atraumatic.  Neck: Neck supple.  Cardiovascular: Normal rate and regular rhythm.   Pulmonary/Chest: Effort normal. No respiratory distress. She has no wheezes. She has no rales.  Abdominal: Soft. Bowel sounds are normal. There is tenderness in the right lower quadrant, suprapubic area and left lower quadrant. There is no rigidity, no rebound, no guarding, no CVA tenderness and negative Murphy's sign.  Neurological: She is alert and oriented to person, place, and time.  Skin: Skin is warm and dry. She is not diaphoretic.  Psychiatric: She has a normal mood and affect. Her behavior is normal.  Nursing note and vitals reviewed.   ED Course  Procedures (including critical care time) Labs Review Labs Reviewed  URINALYSIS, ROUTINE W REFLEX MICROSCOPIC - Abnormal; Notable for the following:    APPearance CLOUDY (*)    Hgb urine dipstick TRACE (*)    Protein, ur >300 (*)    All other components within normal limits  CBC WITH DIFFERENTIAL/PLATELET - Abnormal;  Notable for the following:    Neutrophils Relative % 81 (*)    Lymphocytes Relative 10 (*)    All other components within normal limits  COMPREHENSIVE METABOLIC PANEL - Abnormal; Notable for the following:    Glucose, Bld 103 (*)    BUN 30 (*)    Creatinine, Ser 2.08 (*)    GFR calc non Af Amer 23 (*)    GFR calc Af Amer 27 (*)    All other components within normal limits  URINE MICROSCOPIC-ADD ON  LIPASE, BLOOD    Imaging Review Ct Abdomen Pelvis Wo Contrast  07/31/2014   CLINICAL DATA:  Bilateral flank pain an urinary frequency today.  EXAM: CT ABDOMEN AND PELVIS WITHOUT CONTRAST  TECHNIQUE: Multidetector CT imaging of the abdomen and pelvis was performed following the standard protocol without IV contrast.  COMPARISON:  None.  FINDINGS: The lung bases are clear There is no pleural or pericardial effusion.  Extensive stranding is  seen about both kidneys. No renal or ureteral stones are identified and there is no hydronephrosis on the right or left. The gallbladder, liver, spleen, adrenal glands and pancreas appear normal. Uterus, adnexa and urinary bladder are unremarkable. The stomach, small and large bowel and appendix appear normal. There is no lymphadenopathy or fluid.  Convex left lumbar scoliosis and multilevel spondylosis are seen. No focal lytic or sclerotic bony lesion is identified.  IMPRESSION: Negative for urinary tract stone or hydronephrosis.  Extensive renal stranding bilaterally is nonspecific but can be seen in renal insufficiency or infectious or inflammatory processes.   Electronically Signed   By: Inge Rise M.D.   On: 07/31/2014 22:17     EKG Interpretation None      MDM   Final diagnoses:  Abdominal pain  Bilateral flank pain  Elevated serum creatinine   Patient presents with bilateral flank pain urine shows trace hemoglobin. No infection. Patient has negative CVA tenderness.  CT shows renal stranding, nonspecific finding. Patient's creatinine is elevated  2.08 today, elevated from 0.79 almost 2 years ago. Discussed patient history, condition with Dr. Betsey Holiday, who also evaluated the patient during this encounter plan to discharge home with follow-up with PCP.  Meds given in ED:  Medications  sodium chloride 0.9 % bolus 1,000 mL (0 mLs Intravenous Stopped 07/31/14 2313)  ondansetron (ZOFRAN) injection 4 mg (4 mg Intravenous Given 07/31/14 2008)    Discharge Medication List as of 07/31/2014 11:04 PM       Harvie Heck, PA-C 07/31/14 9518  Orpah Greek, MD 07/31/14 [phone]

## 2014-08-10 DIAGNOSIS — E785 Hyperlipidemia, unspecified: Secondary | ICD-10-CM | POA: Diagnosis not present

## 2014-08-10 DIAGNOSIS — E559 Vitamin D deficiency, unspecified: Secondary | ICD-10-CM | POA: Diagnosis not present

## 2014-08-10 DIAGNOSIS — R899 Unspecified abnormal finding in specimens from other organs, systems and tissues: Secondary | ICD-10-CM | POA: Diagnosis not present

## 2014-08-23 DIAGNOSIS — R0981 Nasal congestion: Secondary | ICD-10-CM | POA: Diagnosis not present

## 2014-09-04 DIAGNOSIS — N3946 Mixed incontinence: Secondary | ICD-10-CM | POA: Diagnosis not present

## 2014-09-04 DIAGNOSIS — K5900 Constipation, unspecified: Secondary | ICD-10-CM | POA: Diagnosis not present

## 2014-09-18 DIAGNOSIS — R194 Change in bowel habit: Secondary | ICD-10-CM | POA: Diagnosis not present

## 2014-09-18 DIAGNOSIS — Z8371 Family history of colonic polyps: Secondary | ICD-10-CM | POA: Diagnosis not present

## 2014-09-19 DIAGNOSIS — L650 Telogen effluvium: Secondary | ICD-10-CM | POA: Diagnosis not present

## 2014-09-19 DIAGNOSIS — L908 Other atrophic disorders of skin: Secondary | ICD-10-CM | POA: Diagnosis not present

## 2014-10-23 DIAGNOSIS — N3946 Mixed incontinence: Secondary | ICD-10-CM | POA: Diagnosis not present

## 2014-11-01 DIAGNOSIS — E782 Mixed hyperlipidemia: Secondary | ICD-10-CM | POA: Diagnosis not present

## 2014-11-01 DIAGNOSIS — R7989 Other specified abnormal findings of blood chemistry: Secondary | ICD-10-CM | POA: Diagnosis not present

## 2014-11-01 DIAGNOSIS — E559 Vitamin D deficiency, unspecified: Secondary | ICD-10-CM | POA: Diagnosis not present

## 2014-11-01 DIAGNOSIS — E789 Disorder of lipoprotein metabolism, unspecified: Secondary | ICD-10-CM | POA: Diagnosis not present

## 2014-11-13 DIAGNOSIS — M79672 Pain in left foot: Secondary | ICD-10-CM | POA: Diagnosis not present

## 2014-11-14 DIAGNOSIS — E782 Mixed hyperlipidemia: Secondary | ICD-10-CM | POA: Diagnosis not present

## 2014-11-15 DIAGNOSIS — M79672 Pain in left foot: Secondary | ICD-10-CM | POA: Diagnosis not present

## 2014-11-19 DIAGNOSIS — M79672 Pain in left foot: Secondary | ICD-10-CM | POA: Diagnosis not present

## 2014-11-22 DIAGNOSIS — M79672 Pain in left foot: Secondary | ICD-10-CM | POA: Diagnosis not present

## 2014-11-28 DIAGNOSIS — M79672 Pain in left foot: Secondary | ICD-10-CM | POA: Diagnosis not present

## 2014-11-29 DIAGNOSIS — H1031 Unspecified acute conjunctivitis, right eye: Secondary | ICD-10-CM | POA: Diagnosis not present

## 2014-11-30 DIAGNOSIS — M79672 Pain in left foot: Secondary | ICD-10-CM | POA: Diagnosis not present

## 2014-12-03 DIAGNOSIS — M79672 Pain in left foot: Secondary | ICD-10-CM | POA: Diagnosis not present

## 2014-12-10 ENCOUNTER — Other Ambulatory Visit: Payer: Self-pay

## 2014-12-12 DIAGNOSIS — M79672 Pain in left foot: Secondary | ICD-10-CM | POA: Diagnosis not present

## 2014-12-19 DIAGNOSIS — S6991XA Unspecified injury of right wrist, hand and finger(s), initial encounter: Secondary | ICD-10-CM | POA: Diagnosis not present

## 2014-12-19 DIAGNOSIS — M189 Osteoarthritis of first carpometacarpal joint, unspecified: Secondary | ICD-10-CM | POA: Diagnosis not present

## 2014-12-19 DIAGNOSIS — M1811 Unilateral primary osteoarthritis of first carpometacarpal joint, right hand: Secondary | ICD-10-CM | POA: Diagnosis not present

## 2014-12-25 DIAGNOSIS — Z1283 Encounter for screening for malignant neoplasm of skin: Secondary | ICD-10-CM | POA: Diagnosis not present

## 2015-02-14 DIAGNOSIS — R7989 Other specified abnormal findings of blood chemistry: Secondary | ICD-10-CM | POA: Diagnosis not present

## 2015-02-14 DIAGNOSIS — E782 Mixed hyperlipidemia: Secondary | ICD-10-CM | POA: Diagnosis not present

## 2015-02-14 DIAGNOSIS — G4700 Insomnia, unspecified: Secondary | ICD-10-CM | POA: Diagnosis not present

## 2015-02-14 DIAGNOSIS — E559 Vitamin D deficiency, unspecified: Secondary | ICD-10-CM | POA: Diagnosis not present

## 2015-03-22 DIAGNOSIS — L259 Unspecified contact dermatitis, unspecified cause: Secondary | ICD-10-CM | POA: Diagnosis not present

## 2015-03-25 DIAGNOSIS — E782 Mixed hyperlipidemia: Secondary | ICD-10-CM | POA: Diagnosis not present

## 2015-04-02 DIAGNOSIS — H52203 Unspecified astigmatism, bilateral: Secondary | ICD-10-CM | POA: Diagnosis not present

## 2015-04-02 DIAGNOSIS — H2513 Age-related nuclear cataract, bilateral: Secondary | ICD-10-CM | POA: Diagnosis not present

## 2015-04-26 DIAGNOSIS — L219 Seborrheic dermatitis, unspecified: Secondary | ICD-10-CM | POA: Diagnosis not present

## 2015-04-26 DIAGNOSIS — B009 Herpesviral infection, unspecified: Secondary | ICD-10-CM | POA: Diagnosis not present

## 2015-04-26 DIAGNOSIS — X32XXXA Exposure to sunlight, initial encounter: Secondary | ICD-10-CM | POA: Diagnosis not present

## 2015-04-26 DIAGNOSIS — L570 Actinic keratosis: Secondary | ICD-10-CM | POA: Diagnosis not present

## 2015-05-20 ENCOUNTER — Other Ambulatory Visit: Payer: Self-pay | Admitting: Unknown Physician Specialty

## 2015-05-20 DIAGNOSIS — J301 Allergic rhinitis due to pollen: Secondary | ICD-10-CM | POA: Diagnosis not present

## 2015-05-20 DIAGNOSIS — R51 Headache: Secondary | ICD-10-CM | POA: Diagnosis not present

## 2015-05-20 DIAGNOSIS — G479 Sleep disorder, unspecified: Secondary | ICD-10-CM | POA: Diagnosis not present

## 2015-05-20 DIAGNOSIS — R519 Headache, unspecified: Secondary | ICD-10-CM

## 2015-05-28 DIAGNOSIS — J301 Allergic rhinitis due to pollen: Secondary | ICD-10-CM | POA: Diagnosis not present

## 2015-06-05 ENCOUNTER — Ambulatory Visit: Payer: Medicare Other | Attending: Otolaryngology

## 2015-06-05 DIAGNOSIS — R0683 Snoring: Secondary | ICD-10-CM | POA: Insufficient documentation

## 2015-06-05 DIAGNOSIS — G4730 Sleep apnea, unspecified: Secondary | ICD-10-CM | POA: Diagnosis not present

## 2015-06-05 DIAGNOSIS — R51 Headache: Secondary | ICD-10-CM | POA: Diagnosis not present

## 2015-06-05 DIAGNOSIS — F5101 Primary insomnia: Secondary | ICD-10-CM | POA: Diagnosis not present

## 2015-06-19 ENCOUNTER — Ambulatory Visit
Admission: RE | Admit: 2015-06-19 | Discharge: 2015-06-19 | Disposition: A | Discharge status: Home / Self Care | Payer: Medicare Other | Source: Ambulatory Visit | Attending: Unknown Physician Specialty | Admitting: Unknown Physician Specialty

## 2015-06-19 DIAGNOSIS — R42 Dizziness and giddiness: Secondary | ICD-10-CM | POA: Diagnosis not present

## 2015-06-19 DIAGNOSIS — R51 Headache: Secondary | ICD-10-CM | POA: Insufficient documentation

## 2015-06-19 DIAGNOSIS — R519 Headache, unspecified: Secondary | ICD-10-CM

## 2015-06-19 LAB — POCT I-STAT CREATININE: CREATININE: 0.9 mg/dL (ref 0.44–1.00)

## 2015-06-19 MED ORDER — GADOBENATE DIMEGLUMINE 529 MG/ML IV SOLN
15.0000 mL | Freq: Once | INTRAVENOUS | Status: AC | PRN
  Administered 2015-06-19: 14 mL via INTRAVENOUS

## 2015-06-26 DIAGNOSIS — G479 Sleep disorder, unspecified: Secondary | ICD-10-CM | POA: Diagnosis not present

## 2015-06-26 DIAGNOSIS — R51 Headache: Secondary | ICD-10-CM | POA: Diagnosis not present

## 2015-06-26 DIAGNOSIS — J301 Allergic rhinitis due to pollen: Secondary | ICD-10-CM | POA: Diagnosis not present

## 2015-07-12 DIAGNOSIS — M5442 Lumbago with sciatica, left side: Secondary | ICD-10-CM | POA: Diagnosis not present

## 2015-07-12 DIAGNOSIS — M25552 Pain in left hip: Secondary | ICD-10-CM | POA: Diagnosis not present

## 2015-07-19 ENCOUNTER — Other Ambulatory Visit: Payer: Self-pay | Admitting: Orthopedic Surgery

## 2015-07-19 DIAGNOSIS — M545 Low back pain: Secondary | ICD-10-CM

## 2015-07-27 ENCOUNTER — Ambulatory Visit
Admission: RE | Admit: 2015-07-27 | Discharge: 2015-07-27 | Disposition: A | Discharge status: Home / Self Care | Payer: Medicare Other | Source: Ambulatory Visit | Attending: Orthopedic Surgery | Admitting: Orthopedic Surgery

## 2015-07-27 DIAGNOSIS — M5126 Other intervertebral disc displacement, lumbar region: Secondary | ICD-10-CM | POA: Diagnosis not present

## 2015-07-27 DIAGNOSIS — M545 Low back pain: Secondary | ICD-10-CM

## 2015-08-02 DIAGNOSIS — M5442 Lumbago with sciatica, left side: Secondary | ICD-10-CM | POA: Diagnosis not present

## 2015-08-02 DIAGNOSIS — M25552 Pain in left hip: Secondary | ICD-10-CM | POA: Diagnosis not present

## 2015-08-05 DIAGNOSIS — E7800 Pure hypercholesterolemia, unspecified: Secondary | ICD-10-CM | POA: Diagnosis not present

## 2015-08-05 DIAGNOSIS — M810 Age-related osteoporosis without current pathological fracture: Secondary | ICD-10-CM | POA: Diagnosis not present

## 2015-08-05 DIAGNOSIS — Z79899 Other long term (current) drug therapy: Secondary | ICD-10-CM | POA: Diagnosis not present

## 2015-08-05 DIAGNOSIS — Z0000 Encounter for general adult medical examination without abnormal findings: Secondary | ICD-10-CM | POA: Diagnosis not present

## 2015-08-05 DIAGNOSIS — Z1389 Encounter for screening for other disorder: Secondary | ICD-10-CM | POA: Diagnosis not present

## 2015-08-05 DIAGNOSIS — E559 Vitamin D deficiency, unspecified: Secondary | ICD-10-CM | POA: Diagnosis not present

## 2015-08-05 DIAGNOSIS — G43909 Migraine, unspecified, not intractable, without status migrainosus: Secondary | ICD-10-CM | POA: Diagnosis not present

## 2015-08-05 DIAGNOSIS — Z1159 Encounter for screening for other viral diseases: Secondary | ICD-10-CM | POA: Diagnosis not present

## 2015-08-12 DIAGNOSIS — M5417 Radiculopathy, lumbosacral region: Secondary | ICD-10-CM | POA: Diagnosis not present

## 2015-08-12 DIAGNOSIS — M4806 Spinal stenosis, lumbar region: Secondary | ICD-10-CM | POA: Diagnosis not present

## 2015-10-11 ENCOUNTER — Other Ambulatory Visit: Payer: Self-pay | Admitting: Family Medicine

## 2015-10-11 ENCOUNTER — Other Ambulatory Visit: Payer: Self-pay

## 2015-10-11 DIAGNOSIS — Z1231 Encounter for screening mammogram for malignant neoplasm of breast: Secondary | ICD-10-CM

## 2015-10-11 DIAGNOSIS — M810 Age-related osteoporosis without current pathological fracture: Secondary | ICD-10-CM

## 2015-10-15 DIAGNOSIS — R5383 Other fatigue: Secondary | ICD-10-CM | POA: Diagnosis not present

## 2015-10-15 DIAGNOSIS — E538 Deficiency of other specified B group vitamins: Secondary | ICD-10-CM | POA: Diagnosis not present

## 2015-10-15 DIAGNOSIS — M50323 Other cervical disc degeneration at C6-C7 level: Secondary | ICD-10-CM | POA: Diagnosis not present

## 2015-10-15 DIAGNOSIS — G43709 Chronic migraine without aura, not intractable, without status migrainosus: Secondary | ICD-10-CM | POA: Diagnosis not present

## 2015-10-15 DIAGNOSIS — E559 Vitamin D deficiency, unspecified: Secondary | ICD-10-CM | POA: Diagnosis not present

## 2015-10-15 DIAGNOSIS — M5481 Occipital neuralgia: Secondary | ICD-10-CM | POA: Diagnosis not present

## 2015-10-21 DIAGNOSIS — E7800 Pure hypercholesterolemia, unspecified: Secondary | ICD-10-CM | POA: Diagnosis not present

## 2015-10-21 DIAGNOSIS — R0789 Other chest pain: Secondary | ICD-10-CM | POA: Diagnosis not present

## 2015-10-28 DIAGNOSIS — R0789 Other chest pain: Secondary | ICD-10-CM | POA: Diagnosis not present

## 2015-10-29 ENCOUNTER — Ambulatory Visit: Payer: Self-pay

## 2015-10-29 ENCOUNTER — Ambulatory Visit
Admission: RE | Admit: 2015-10-29 | Discharge: 2015-10-29 | Disposition: A | Discharge status: Home / Self Care | Payer: Medicare Other | Source: Ambulatory Visit | Attending: Family Medicine | Admitting: Family Medicine

## 2015-10-29 ENCOUNTER — Ambulatory Visit
Admission: RE | Admit: 2015-10-29 | Discharge: 2015-10-29 | Disposition: A | Discharge status: Home / Self Care | Payer: Medicare Other | Source: Ambulatory Visit

## 2015-10-29 DIAGNOSIS — Z780 Asymptomatic menopausal state: Secondary | ICD-10-CM | POA: Diagnosis not present

## 2015-10-29 DIAGNOSIS — Z1231 Encounter for screening mammogram for malignant neoplasm of breast: Secondary | ICD-10-CM | POA: Diagnosis not present

## 2015-10-29 DIAGNOSIS — M8589 Other specified disorders of bone density and structure, multiple sites: Secondary | ICD-10-CM | POA: Diagnosis not present

## 2015-10-29 DIAGNOSIS — M810 Age-related osteoporosis without current pathological fracture: Secondary | ICD-10-CM

## 2015-11-21 DIAGNOSIS — R51 Headache: Secondary | ICD-10-CM | POA: Diagnosis not present

## 2015-12-11 ENCOUNTER — Encounter: Payer: Self-pay | Admitting: Internal Medicine

## 2015-12-11 ENCOUNTER — Ambulatory Visit (INDEPENDENT_AMBULATORY_CARE_PROVIDER_SITE_OTHER): Payer: Medicare Other | Admitting: Internal Medicine

## 2015-12-11 VITALS — BP 130/90 | HR 58 | Temp 97.9°F | Ht 65.1 in | Wt 149.5 lb

## 2015-12-11 DIAGNOSIS — M5481 Occipital neuralgia: Secondary | ICD-10-CM

## 2015-12-11 DIAGNOSIS — R143 Flatulence: Secondary | ICD-10-CM

## 2015-12-11 DIAGNOSIS — G4700 Insomnia, unspecified: Secondary | ICD-10-CM

## 2015-12-11 MED ORDER — AMITRIPTYLINE HCL 10 MG PO TABS: 10.0000 mg | ORAL_TABLET | Freq: Every day | ORAL | Status: DC

## 2015-12-11 NOTE — Progress Notes (Signed)
Pre visit review using our clinic review tool, if applicable. No additional management support is needed unless otherwise documented below in the visit note. 

## 2015-12-11 NOTE — Assessment & Plan Note (Signed)
Has had imaging and was diagnosed by neurology - will see a new neurologist at [facility] but not until December Will try amitriptyline 10 mg nightly - can titrate up if needed Advised it may cause drowsiness even in the morning

## 2015-12-11 NOTE — Patient Instructions (Signed)
   Medications reviewed and updated.  Changes include trying amitriptyline 10 mg nightly.    Your prescription(s) have been submitted to your pharmacy. Please take as directed and contact our office if you believe you are having problem(s) with the medication(s).

## 2015-12-11 NOTE — Progress Notes (Signed)
Subjective:    Patient ID: Kylie Wheeler, female    DOB: 24-Dec-1945, 70 y.o.   MRN: [id number]  HPI She is here to establish with a new pcp.  She is here for follow up.  If she eats a lot of carbs she has increased gas and bloating.  She wonders if this is just related to age or a possible gluten sensitivity.  She notices when she eats bread, pasta and sometimes even potatoes.  She is [place] and has grown up eating all these foods.  She also is a little lactose intolerant.   Headaches:  She has occipital neuralgia.  She was diagnosed by neurology.  She has an appointment with a different neurologist at [facility] in December.  She takes baclofen sometimes now and it does help, but she still has daily headaches.  The pain is located in the occipital region of her head and she has tenderness at the base of her skull.  The pain often wraps around to the front and upper part of her head.  She wakes up with headaches in the morning.  She has had headaches for most of her life, but they have been migraines.  She takes amerge, but that has not worked for these headaches.  She denies any chronic neck pain.  She denies numbness/tingling/weakness in her arms.     Medications and allergies reviewed with patient and updated if appropriate.  Patient Active Problem List   Diagnosis Date Noted  . Allergic reaction 03/14/2014  . Skin infection 03/14/2014  . Pain in joint, upper arm 03/14/2014  . Syncope 11/29/2012  . VITAMIN D DEFICIENCY 08/14/2010  . OSTEOPOROSIS 08/14/2010  . COLONIC POLYPS, HX OF 08/14/2010  . VERTIGO 08/22/2007  . JAW PAIN 03/29/2007  . HYPERLIPIDEMIA 01/12/2007  . INSOMNIA, CHRONIC 01/12/2007  . HEADACHE 01/12/2007    Current Outpatient Prescriptions on File Prior to Visit  Medication Sig Dispense Refill  . Aspirin-Acetaminophen-Caffeine (EXCEDRIN PO) Take 1 tablet by mouth daily as needed (migraine).     . cetirizine (ZYRTEC) 10 MG tablet Take 10 mg by mouth at bedtime.     . Cholecalciferol (VITAMIN D3) 2000 UNITS TABS Take 1 tablet by mouth daily.    . clorazepate (TRANXENE) 7.5 MG tablet TAKE 1/2 TABLET BY MOUTH AT BEDTIME AS NEEDED FOR MIGRAINES (Patient taking differently: 1 table qhs prn sleep) 30 tablet 1  . naratriptan (AMERGE) 2.5 MG tablet Take 2.5 mg by mouth daily as needed for migraine (migraine).     . Acyclovir (ZOVIRAX PO) Take 2 tablets by mouth daily as needed (prevent infection). Reported on 12/11/2015     No current facility-administered medications on file prior to visit.    Past Medical History  Diagnosis Date  . Migraines   . Hyperlipidemia   . Vitamin D deficiency   . Osteoporosis   . Insomnia   . Bladder problem     Past Surgical History  Procedure Laterality Date  . Tubal ligation    . Adenoidectomy      Social History   Social History  . Marital Status: Widowed    Spouse Name: N/A  . Number of Children: N/A  . Years of Education: N/A   Occupational History  . retired-- IT trainer    Social History Main Topics  . Smoking status: Never Smoker   . Smokeless tobacco: Never Used  . Alcohol Use: No  . Drug Use: No  . Sexual Activity:    Partners:  Male     Comment: pt actually said whether she is sexually active or not is none of the govt business   Other Topics Concern  . None   Social History Narrative    Family History  Problem Relation Age of Onset  . Stroke Mother   . Heart attack Mother   . Atrial fibrillation Mother   . Heart failure Mother   . Stroke Father   . Heart failure Father   . Hyperlipidemia Brother   . Hypertension Brother   . Heart disease Maternal Grandfather   . Heart disease Paternal Grandmother   . Heart disease Paternal Grandfather   . Hyperlipidemia Sister   . Hyperlipidemia Brother   . Hypertension Brother     Review of Systems  Constitutional: Negative for fever.  Respiratory: Negative for cough, shortness of breath and wheezing.   Cardiovascular: Negative for chest  pain, palpitations and leg swelling.  Neurological: Positive for dizziness (occasionall when extends head) and headaches. Negative for weakness, light-headedness and numbness.       Objective:   Filed Vitals:   12/11/15 1551  BP: 130/90  Pulse: 58  Temp: 97.9 F (36.6 C)   Filed Weights   12/11/15 1551  Weight: 149 lb 8 oz (67.813 kg)   Body mass index is 24.79 kg/(m^2).   Physical Exam Constitutional: Appears well-developed and well-nourished. No distress.  Neck: Neck supple. No tracheal deviation present. No thyromegaly present.  No carotid bruit. No cervical adenopathy.   Cardiovascular: Normal rate, regular rhythm and normal heart sounds.   No murmur heard.  No edema Pulmonary/Chest: Effort normal and breath sounds normal. No respiratory distress. No wheezes.  Msk: tenderness at base of skull with palpation, no cervical spine tenderness or paravertebral muscle tenderness       Assessment & Plan:   Increased flatulence Her increased gas/bloating is likely related to carbohydrate intolerance not an allergy. Dicussed testing for gluten sensitivity, but doubt true allergy and since the test can be falsely negative will not test her.  Advised reducing carb intake - eat as tolerated   See Problem List for Assessment and Plan of chronic medical problems.

## 2015-12-11 NOTE — Assessment & Plan Note (Signed)
Takes clorazepate nightly - has been on it for years Continue current medication - she will try to decrease to 1/2 a tab at night

## 2015-12-26 ENCOUNTER — Encounter: Payer: Self-pay | Admitting: Internal Medicine

## 2015-12-26 MED ORDER — GABAPENTIN 300 MG PO CAPS: 300.0000 mg | ORAL_CAPSULE | Freq: Every day | ORAL | Status: DC

## 2015-12-30 ENCOUNTER — Telehealth: Payer: Self-pay

## 2015-12-30 DIAGNOSIS — R519 Headache, unspecified: Secondary | ICD-10-CM

## 2015-12-30 DIAGNOSIS — R51 Headache: Principal | ICD-10-CM

## 2015-12-30 NOTE — Telephone Encounter (Signed)
Please advise 

## 2015-12-30 NOTE — Telephone Encounter (Signed)
Referral ordered

## 2015-12-30 NOTE — Telephone Encounter (Signed)
Patient called and needs a referral to see a neurology. It is because of her headaches. She wants to make sure its a good one, not just anybody she states. Please follow up, Thank you.

## 2016-01-27 DIAGNOSIS — L908 Other atrophic disorders of skin: Secondary | ICD-10-CM | POA: Diagnosis not present

## 2016-01-27 DIAGNOSIS — L308 Other specified dermatitis: Secondary | ICD-10-CM | POA: Diagnosis not present

## 2016-02-03 ENCOUNTER — Encounter: Payer: Self-pay | Admitting: Internal Medicine

## 2016-02-05 NOTE — Telephone Encounter (Signed)
Please advise 

## 2016-02-10 DIAGNOSIS — M25561 Pain in right knee: Secondary | ICD-10-CM | POA: Diagnosis not present

## 2016-02-11 ENCOUNTER — Other Ambulatory Visit: Payer: Self-pay | Admitting: Orthopedic Surgery

## 2016-02-11 DIAGNOSIS — M25561 Pain in right knee: Secondary | ICD-10-CM

## 2016-02-22 ENCOUNTER — Ambulatory Visit
Admission: RE | Admit: 2016-02-22 | Discharge: 2016-02-22 | Disposition: A | Discharge status: Home / Self Care | Payer: Medicare Other | Source: Ambulatory Visit | Attending: Orthopedic Surgery | Admitting: Orthopedic Surgery

## 2016-02-22 DIAGNOSIS — M25561 Pain in right knee: Secondary | ICD-10-CM | POA: Diagnosis not present

## 2016-02-28 ENCOUNTER — Ambulatory Visit: Payer: Medicare Other | Admitting: Neurology

## 2016-03-04 ENCOUNTER — Encounter: Payer: Self-pay | Admitting: Neurology

## 2016-03-04 ENCOUNTER — Ambulatory Visit (INDEPENDENT_AMBULATORY_CARE_PROVIDER_SITE_OTHER): Payer: Medicare Other | Admitting: Neurology

## 2016-03-04 VITALS — BP 132/84 | HR 78 | Ht 61.0 in | Wt 148.0 lb

## 2016-03-04 DIAGNOSIS — G4486 Cervicogenic headache: Secondary | ICD-10-CM

## 2016-03-04 DIAGNOSIS — Z23 Encounter for immunization: Secondary | ICD-10-CM | POA: Diagnosis not present

## 2016-03-04 DIAGNOSIS — G44229 Chronic tension-type headache, not intractable: Secondary | ICD-10-CM | POA: Diagnosis not present

## 2016-03-04 DIAGNOSIS — R51 Headache: Secondary | ICD-10-CM

## 2016-03-04 MED ORDER — TIZANIDINE HCL 2 MG PO TABS: 2.0000 mg | ORAL_TABLET | Freq: Three times a day (TID) | ORAL | 0 refills | Status: DC | PRN

## 2016-03-04 MED ORDER — NORTRIPTYLINE HCL 10 MG PO CAPS: 20.0000 mg | ORAL_CAPSULE | Freq: Every day | ORAL | 3 refills | Status: DC

## 2016-03-04 NOTE — Patient Instructions (Signed)
I think the headaches are coming from the neck 1.  Stop amitriptyline.  Instead, start nortriptyline.  Take two 10mg  pills at bedtime.  Contact me in 4 weeks with update and we can adjust dose if needed.  Contact sooner if experiencing side effects. 2.  Stop baclofen.  Instead, we will try tizanidine 2mg , another muscle relaxer.  It will be written as every 8 hours (three times daily) as needed.  But first just take at bedtime to see how you respond.  You can increase frequency as needed or tolerated.  3.  I will refer you to Dr. Hulan Saas of Sports Medicine, who treats neck issues. 4.  Follow up in 3 months but remember contact me in 4 weeks with update or sooner with any problems.

## 2016-03-04 NOTE — Progress Notes (Signed)
NEUROLOGY CONSULTATION NOTE  EDDIE ZEC MRN: [id number] DOB: 02/16/1946  Referring provider: Dr. Quay Burow Primary care provider: Dr. Quay Burow  Reason for consult:  headache  HISTORY OF PRESENT ILLNESS: Kylie Wheeler. Kylie Wheeler is a 70 year old right-handed woman with migraines who presents for headache.  History obtained by patient, prior neurologist note and PCP note.  She has longstanding history of headaches since childhood.  They ar pounding bifrontal headaches that occur 3 to 4 times a week.  They respond to [place].  Prior preventative medications included Effexor (many years ago) and possibly topiramate and Depakote (many years ago).  For a year, she has had new type of headache.  It occurs across the back of her head, down both sides of her neck and shoulders, and across the forehead.  It is a nonthrobbing aching, with intensity from 4 to 10/10.  There is no associated symptoms such as visual disturbance, nausea, photophobia or phonophobia.  They occur most days of the week.  Laying the back of her head on a pillow, head rest of the chair, or sofa, triggers the occipital pain.  There is no burning or electric pain radiating up the occipital region.  Past NSAIDS:  ibuprofen Past analgesics:  tramadol Past abortive triptans:  Amerge (does not help these headaches) Past muscle relaxants:  no Past anti-emetic:  no Past anti-anxiolytic:  no Past sleep aide:  trazodone Past antihypertensive medications:  Toprol (helped but caused head-rush sensation) Past antidepressant medications:  no Past anticonvulsant medications:  Gabapentin 300mg  at bedtime (briefly)  Current NSAIDS:  no Current analgesics:  Excedrin Current triptans:  no Current anti-emetic:  no Current muscle relaxants:  baclofen 10mg  (helps the occipital pain but not frontal or neck/shoulder pain). Current anti-anxiolytic:  Tranxene (for sleep.  Has been on it for over 20 years.  Currently trying to wean off) Current sleep  aide:  Tranzene (trying to wean off), amitriptyline 10mg  Current Antihypertensive medications:  no Current Antidepressant medications:  amitriptyline 10mg  (for one month.  Ineffective.  20mg  caused daytime drowsiness). Current Anticonvulsant medications:  no Current Vitamins/Herbal/Supplements:  Magnesium, D, turmeric Current Antihistamines/Decongestants:  no Other therapy:  no  She had an MRI of the brain and IAC with and without contrast on 06/19/15, which was personally reviewed and revealed scattered hyperintensities in the cerebral white matter, consistent with chronic small vessel ischemic changes.  She had cervical X-ray performed on 10/15/15, which demonstrated multilevel degenerative disc disease at C3-4 and C6-7, but most prominent at C5-6.  Caffeine:  no Alcohol:  no Smoker:  no Diet:  hydrates Exercise:  Not recently due to knee problems Depression/stress:  stable Sleep hygiene:  Varies due to pain Family history of headache:  Mother, grandmother, son  Labs from May:  TSH 1.726; B12 639;  methylmalonic acid 127; vitamin D 55.1.  PAST MEDICAL HISTORY: Past Medical History:  Diagnosis Date  . Bladder problem   . Hyperlipidemia   . Insomnia   . Migraines   . Osteoporosis   . Vitamin D deficiency     PAST SURGICAL HISTORY: Past Surgical History:  Procedure Laterality Date  . ADENOIDECTOMY    . TUBAL LIGATION      MEDICATIONS: Current Outpatient Prescriptions on File Prior to Visit  Medication Sig Dispense Refill  . Acyclovir (ZOVIRAX PO) Take 2 tablets by mouth daily as needed (prevent infection). Reported on 12/11/2015    . Aspirin-Acetaminophen-Caffeine (EXCEDRIN PO) Take 1 tablet by mouth daily as needed (migraine).     Marland Kitchen  Cholecalciferol (VITAMIN D3) 2000 UNITS TABS Take 1 tablet by mouth daily.    . clorazepate (TRANXENE) 7.5 MG tablet TAKE 1/2 TABLET BY MOUTH AT BEDTIME AS NEEDED FOR MIGRAINES (Patient taking differently: 1 table qhs prn sleep) 30 tablet 1  .  fluticasone (FLONASE) 50 MCG/ACT nasal spray Reported on 12/11/2015  5  . naratriptan (AMERGE) 2.5 MG tablet Take 2.5 mg by mouth daily as needed for migraine (migraine).      No current facility-administered medications on file prior to visit.     ALLERGIES: Allergies  Allergen Reactions  . Codeine Nausea Only    REACTION: sick on stomach    FAMILY HISTORY: Family History  Problem Relation Age of Onset  . Stroke Mother   . Heart attack Mother   . Atrial fibrillation Mother   . Heart failure Mother   . Stroke Father   . Heart failure Father   . Hyperlipidemia Brother   . Hypertension Brother   . Heart disease Maternal Grandfather   . Heart disease Paternal Grandmother   . Heart disease Paternal Grandfather   . Hyperlipidemia Sister   . Hyperlipidemia Brother   . Hypertension Brother      SOCIAL HISTORY: Social History   Social History  . Marital status: Widowed    Spouse name: N/A  . Number of children: N/A  . Years of education: N/A   Occupational History  . retired-- IT trainer    Social History Main Topics  . Smoking status: Never Smoker  . Smokeless tobacco: Never Used  . Alcohol use No  . Drug use: No  . Sexual activity: Not Currently    Partners: Male     Comment: pt actually said whether she is sexually active or not is none of the govt business   Other Topics Concern  . Not on file   Social History Narrative  . No narrative on file    REVIEW OF SYSTEMS: Constitutional: No fevers, chills, or sweats, no generalized fatigue, change in appetite Eyes: No visual changes, double vision, eye pain Ear, nose and throat: No hearing loss, ear pain, nasal congestion, sore throat Cardiovascular: No chest pain, palpitations Respiratory:  No shortness of breath at rest or with exertion, wheezes GastrointestinaI: No nausea, vomiting, diarrhea, abdominal pain, fecal incontinence Genitourinary:  No dysuria, urinary retention or frequency Musculoskeletal:  No  neck pain, back pain Integumentary: No rash, pruritus, skin lesions Neurological: as above Psychiatric: No depression, insomnia, anxiety Endocrine: No palpitations, fatigue, diaphoresis, mood swings, change in appetite, change in weight, increased thirst Hematologic/Lymphatic:  No purpura, petechiae. Allergic/Immunologic: no itchy/runny eyes, nasal congestion, recent allergic reactions, rashes  PHYSICAL EXAM: Vitals:   03/04/16 0941  BP: 132/84  Pulse: 78   General: No acute distress.  Patient appears well-groomed.  Head:  Normocephalic/atraumatic.  Mild suboccipital tenderness. Eyes:  fundi examined but not visualized Neck: supple, right greater than left paraspinal tenderness, full range of motion Back: No paraspinal tenderness Heart: regular rate and rhythm Lungs: Clear to auscultation bilaterally. Vascular: No carotid bruits. Neurological Exam: Mental status: alert and oriented to person, place, and time, recent and remote memory intact, fund of knowledge intact, attention and concentration intact, speech fluent and not dysarthric, language intact. Cranial nerves: CN I: not tested CN II: pupils equal, round and reactive to light, visual fields intact CN III, IV, VI:  full range of motion, no nystagmus, no ptosis CN V: facial sensation intact CN VII: upper and lower face symmetric CN VIII:  hearing intact CN IX, X: gag intact, uvula midline CN XI: sternocleidomastoid and trapezius muscles intact CN XII: tongue midline Bulk & Tone: normal, no fasciculations. Motor:  5/5 throughout  Sensation: temperature and vibration sensation intact. Deep Tendon Reflexes:  2+ throughout, toes downgoing.  Finger to nose testing:  Without dysmetria.  Heel to shin:  Without dysmetria.  Gait:  Normal station and stride.  Able to turn and tandem walk. Romberg negative.  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  She demonstrates degenerative disc disease on  X-ray.  PLAN: 1.  Will refer to Dr. Hulan Saas of Sports Medicine for evaluation and treatment (such as OMM). 2.  Will switch from amitriptyline to nortriptyline 20mg  at bedtime, which may be better tolerated. 3.  Will switch from baclofen to tizanidine 2mg .  Will start out at bedtime and can gradually increase up to three times daily as needed (cautioned about drowsiness). 4.  She will contact me in 4 weeks with update and we can increase dose of nortriptyline if needed (contact sooner with side effects or concerns) 5.  Follow up in 3 months.  Thank you for allowing me to take part in the care of this patient.  Metta Clines, DO  CC:  Billey Gosling, MD

## 2016-03-05 DIAGNOSIS — M25561 Pain in right knee: Secondary | ICD-10-CM | POA: Diagnosis not present

## 2016-03-06 ENCOUNTER — Telehealth: Payer: Self-pay

## 2016-03-06 ENCOUNTER — Telehealth: Payer: Self-pay | Admitting: Neurology

## 2016-03-06 MED ORDER — TIZANIDINE HCL 2 MG PO TABS: 2.0000 mg | ORAL_TABLET | Freq: Three times a day (TID) | ORAL | 0 refills | Status: DC | PRN

## 2016-03-06 NOTE — Telephone Encounter (Signed)
Spoke with patient. Does want to try to stay w/ Jaffe's regimen if possible. Did not take zanaflex with her. RX sent in to local (to her current location) pharmacy.

## 2016-03-06 NOTE — Telephone Encounter (Signed)
Kylie Wheeler [birth year]. She is out of town and left her medication at home. We received a call from [place] at [facility]. [address]. [place]  [phone] and fax # is [phone]. She was needing Tizanidine 2 MG called in for her at that location. Thank you

## 2016-03-06 NOTE — Telephone Encounter (Signed)
Pt called. Stated she switched her medication yesterday. From amitriptyline 10 mg to nortriptyline 20 mg  And baclofen 10 mg to tizanidine 2 mg. She had a horrible night. Stated she was up all night with headache. Pt stated she is out of town for work and cannot function like that so pt went back to previous medication (after 1 dose). Explained to patient that unfortunately she could experience worsening symptoms before seeing improvement when switching medication. Pt verbalized understanding but stated she didn't think she could manage switching at this time while out of town, will continue on amitriptyline and baclofen for a few weeks. Will reach out when she returns to town, at that time, pt stated she would be more willing to switch. Advised that I would let provider know and mychart her with any recommendations or instructions.

## 2016-03-06 NOTE — Telephone Encounter (Signed)
Message sent via my chart

## 2016-03-06 NOTE — Telephone Encounter (Signed)
I agree.  I wouldn't make any changes until she returns from out of town.

## 2016-03-12 ENCOUNTER — Encounter: Payer: Self-pay | Admitting: Neurology

## 2016-03-12 NOTE — Telephone Encounter (Signed)
Please see my chart message from pt.

## 2016-03-13 ENCOUNTER — Telehealth: Payer: Self-pay | Admitting: Neurology

## 2016-03-13 NOTE — Telephone Encounter (Signed)
Attempted to reach again. Line disconnected

## 2016-03-13 NOTE — Telephone Encounter (Signed)
Per mychart message that was sent to pt:   "Kylie Wheeler,   Dr. Georgie Chard response is as below:   "If the dry mouth is intolerable, she may discontinue nortriptyline and instead start topiramate 50mg  at bedtime. Possible side effects include: difficulty concentrating, paresthesias (numbness and tingling) and weight loss. It may cause dehydration and there is a small risk for kidney stones, so make sure to stay hydrated with water during the day."   Would you like to try the Topiramate (topamax)? If so we will send that RX in.   [place] "

## 2016-03-13 NOTE — Telephone Encounter (Signed)
Attempted to reach, line disconnected.  

## 2016-03-13 NOTE — Telephone Encounter (Signed)
Pt would like a call about her meds - Nortriptyline- she can't take it.

## 2016-03-16 MED ORDER — TOPIRAMATE 50 MG PO TABS: 50.0000 mg | ORAL_TABLET | Freq: Every day | ORAL | 3 refills | Status: DC

## 2016-03-16 NOTE — Addendum Note (Signed)
Addended by: Gerda Diss A on: 03/16/2016 07:32 AM   Modules accepted: Orders

## 2016-03-16 NOTE — Telephone Encounter (Signed)
Discussed via mychart. See mychart encounter.

## 2016-03-16 NOTE — Telephone Encounter (Signed)
RX sent to pharmacy. Med list updated.

## 2016-03-17 ENCOUNTER — Encounter: Payer: Self-pay | Admitting: Neurology

## 2016-03-29 NOTE — Progress Notes (Signed)
[facility] [address], [place] [address] Phone: [phone] Subjective:    I'm seeing this patient by the request  of:  Binnie Rail, MD, Tomi Likens D.O  CC: Neck pain, headache  [id number]  Kylie Wheeler is a 70 y.o. female coming in with complaint of neck pain. Patient has had this problem for quite some time. Has been seen neurology on a regular basis. Patient has had headaches since childhood. Patient is trying to Effexor, Topamax and Depakote and different times in her life. Patient was having more tension-like headaches and she was seeing neurology. Seem to respond somewhat to Excedrin and uses baclofen when needed. Side effects to amitriptyline and nortriptyline recently.  Patient has had workup for this recently. MRI of the brain taken 06/19/2015 this personally independently visualized by me showing some very small vessel ischemic changes but otherwise unremarkable. X-rays of the cervical spine taken 10/15/2015 shows moderate multilevel degenerative disc disease most prominent at C5-C6 as well as C6-C7.   patient states He continues to have discomfort. Non-making any significant strides with 15 therapy she is having at this time. We'll need to know if anything else that can be done. States that he can obtain daily activities. Has noticed that she has had decreasing range of motion especially with rotation of her neck. Denies any radiation down the arms, any numbness, any weakness. Rates the severity of pain though is 6 out of 10. Seems to be more of a constant sensation at this time.  Past Medical History:  Diagnosis Date  . Bladder problem   . Hyperlipidemia   . Insomnia   . Migraines   . Osteoporosis   . Vitamin D deficiency    Past Surgical History:  Procedure Laterality Date  . ADENOIDECTOMY    . TUBAL LIGATION     Social History   Social History  . Marital status: Widowed    Spouse name: N/A  . Number of children: N/A  .  Years of education: N/A   Occupational History  . retired-- IT trainer    Social History Main Topics  . Smoking status: Never Smoker  . Smokeless tobacco: Never Used  . Alcohol use No  . Drug use: No  . Sexual activity: Not Currently    Partners: Male     Comment: pt actually said whether she is sexually active or not is none of the govt business   Other Topics Concern  . None   Social History Narrative  . None   Allergies  Allergen Reactions  . Codeine Nausea Only    REACTION: sick on stomach   Family History  Problem Relation Age of Onset  . Stroke Mother   . Heart attack Mother   . Atrial fibrillation Mother   . Heart failure Mother   . Stroke Father   . Heart failure Father   . Hyperlipidemia Brother   . Hypertension Brother   . Heart disease Maternal Grandfather   . Heart disease Paternal Grandmother   . Heart disease Paternal Grandfather   . Hyperlipidemia Sister   . Hyperlipidemia Brother   . Hypertension Brother     Past medical history, social, surgical and family history all reviewed in electronic medical record.  No pertanent information unless stated regarding to the chief complaint.   Review of Systems: No headache, visual changes, nausea, vomiting, diarrhea, constipation, dizziness, abdominal pain, skin rash, fevers, chills, night sweats, weight loss, swollen lymph nodes, body  aches, joint swelling, muscle aches, chest pain, shortness of breath, mood changes.   Objective  Blood pressure 126/82, pulse 61, weight 147 lb (66.7 kg), SpO2 97 %.  General: No apparent distress alert and oriented x3 mood and affect normal, dressed appropriately.  HEENT: Pupils equal, extraocular movements intact  Respiratory: Patient's speak in full sentences and does not appear short of breath  Cardiovascular: No lower extremity edema, non tender, no erythema  Skin: Warm dry intact with no signs of infection or rash on extremities or on axial skeleton.  Abdomen: Soft  nontender  Neuro: Cranial nerves II through XII are intact, neurovascularly intact in all extremities with 2+ DTRs and 2+ pulses.  Lymph: No lymphadenopathy of posterior or anterior cervical chain or axillae bilaterally.  Gait normal with good balance and coordination.  MSK:  Non tender with full range of motion and good stability and symmetric strength and tone of shoulders, elbows, wrist, hip, knee and ankles bilaterally. Mild arthritic changes mild scoliosis of the thoracolumbar juncture Neck: Inspection unremarkable. No palpable stepoffs. Negative Spurling's maneuver. Limited range of motion lacking the last 10 of left-sided rotation and side bending compared to 5 on the right side. Mild crepitus noted. Lacks last 5 of extension Grip strength and sensation normal in bilateral hands Strength good C4 to T1 distribution No sensory change to C4 to T1 Negative Hoffman sign bilaterally Reflexes normal Poor posture noted  Osteopathic findings C2 flexed rotated and side bent right C6 flexed rotated and side bent left T4 extended rotated and side bent right T8 extended rotated and side bent left L2 flexed rotated and side bent right  Procedure note 97110; 15 minutes spent for Therapeutic exercises as stated in above notes.  This included exercises focusing on stretching, strengthening, with significant focus on eccentric aspects.  Exercises that included:  Basic scapular stabilization to include adduction and depression of scapula Scaption, focusing on proper movement and good control Internal and External rotation utilizing a theraband, with elbow tucked at side entire time Rows with theraband   Proper technique shown and discussed handout in great detail with ATC.  All questions were discussed and answered.     Impression and Recommendations:     This case required medical decision making of moderate complexity.      Note: This dictation was prepared with Dragon dictation  along with smaller phrase technology. Any transcriptional errors that result from this process are unintentional.

## 2016-03-30 ENCOUNTER — Encounter: Payer: Self-pay | Admitting: Family Medicine

## 2016-03-30 ENCOUNTER — Ambulatory Visit (INDEPENDENT_AMBULATORY_CARE_PROVIDER_SITE_OTHER): Payer: Medicare Other | Admitting: Family Medicine

## 2016-03-30 DIAGNOSIS — E559 Vitamin D deficiency, unspecified: Secondary | ICD-10-CM | POA: Diagnosis not present

## 2016-03-30 DIAGNOSIS — M999 Biomechanical lesion, unspecified: Secondary | ICD-10-CM | POA: Diagnosis not present

## 2016-03-30 DIAGNOSIS — R51 Headache: Secondary | ICD-10-CM | POA: Diagnosis not present

## 2016-03-30 DIAGNOSIS — G4486 Cervicogenic headache: Secondary | ICD-10-CM

## 2016-03-30 NOTE — Assessment & Plan Note (Signed)
Incurrent supplementation. If worsening symptoms consider once weekly supplementation.

## 2016-03-30 NOTE — Patient Instructions (Signed)
Good to see you.  Ice 20 minutes 2 times daily. Usually after activity and before bed. Exercises 3 times a week.  On wall with heels, butt shoulder and head touching for a goal of 5 minutes daily  Vitamin D 2000 UIdaily  Turmeric 500mg  twice daily  Avoid overhead activity and keep hands within peripheral vision.  See me again in 3 weeks.

## 2016-03-30 NOTE — Assessment & Plan Note (Signed)
Decision today to treat with OMT was based on Physical Exam  After verbal consent patient was treated with ME, FPR, ST techniques in cervical, thoracic and lumbar areas  Patient tolerated the procedure well with improvement in symptoms  Patient given exercises, stretches and lifestyle modifications  See medications in patient instructions if given  Patient will follow up in 3-4 weeks

## 2016-03-30 NOTE — Assessment & Plan Note (Signed)
Patient is more of a cervicogenic headache. We discussed posture, ergonomic, patient did respond well to osteopathic manipulation. We discussed topical anti-inflammatories. Has muscle relaxer for breakthrough pain. We discussed over-the-counter medications. Follow-up again in 3-4 weeks.

## 2016-04-02 ENCOUNTER — Encounter: Payer: Self-pay | Admitting: Family Medicine

## 2016-04-06 DIAGNOSIS — H52203 Unspecified astigmatism, bilateral: Secondary | ICD-10-CM | POA: Diagnosis not present

## 2016-04-06 DIAGNOSIS — H2513 Age-related nuclear cataract, bilateral: Secondary | ICD-10-CM | POA: Diagnosis not present

## 2016-04-07 ENCOUNTER — Ambulatory Visit: Payer: Medicare Other | Admitting: Neurology

## 2016-04-21 NOTE — Progress Notes (Signed)
[facility] [address], [place] [address] Phone: [phone] Subjective:    I'm seeing this patient by the request  of:  Binnie Rail, MD, Tomi Likens D.O  CC: Neck pain, headache f/u  [id number]  Kylie Wheeler is a 70 y.o. female coming in with complaint of neck pain.  Patient was also having headaches. Seen neurology. We attempted osteopathic manipulation at last exam as well as patient was to work on over-the-counter medications and posture and ergonomics. Patient states She is making progress. States that she is about 30% improved. Still having some left-sided neck pain. Did respond well to the osteopathic manipulation. Patient denies any radiation down the arm at this point any weakness. Still some mild discomfort with sleeping but not as severe as what it was previously. Patient states though that the exercises to seem to cause more exacerbation and stopped doing them at this time. Wanting to have more direction with the exercises.  Previous imaging: . MRI of the brain taken 06/19/2015 this personally independently visualized by me showing some very small vessel ischemic changes but otherwise unremarkable. X-rays of the cervical spine taken 10/15/2015 shows moderate multilevel degenerative disc disease most prominent at C5-C6 as well as C6-C7.     Past Medical History:  Diagnosis Date  . Bladder problem   . Hyperlipidemia   . Insomnia   . Migraines   . Osteoporosis   . Vitamin D deficiency    Past Surgical History:  Procedure Laterality Date  . ADENOIDECTOMY    . TUBAL LIGATION     Social History   Social History  . Marital status: Widowed    Spouse name: N/A  . Number of children: N/A  . Years of education: N/A   Occupational History  . retired-- IT trainer    Social History Main Topics  . Smoking status: Never Smoker  . Smokeless tobacco: Never Used  . Alcohol use No  . Drug use: No  . Sexual activity: Not Currently      Partners: Male     Comment: pt actually said whether she is sexually active or not is none of the govt business   Other Topics Concern  . None   Social History Narrative  . None   Allergies  Allergen Reactions  . Codeine Nausea Only    REACTION: sick on stomach   Family History  Problem Relation Age of Onset  . Stroke Mother   . Heart attack Mother   . Atrial fibrillation Mother   . Heart failure Mother   . Stroke Father   . Heart failure Father   . Hyperlipidemia Brother   . Hypertension Brother   . Heart disease Maternal Grandfather   . Heart disease Paternal Grandmother   . Heart disease Paternal Grandfather   . Hyperlipidemia Sister   . Hyperlipidemia Brother   . Hypertension Brother     Past medical history, social, surgical and family history all reviewed in electronic medical record.  No pertanent information unless stated regarding to the chief complaint.   Review of Systems: No headache, visual changes, nausea, vomiting, diarrhea, constipation, dizziness, abdominal pain, skin rash, fevers, chills, night sweats, weight loss, swollen lymph nodes, body aches, joint swelling, muscle aches, chest pain, shortness of breath, mood changes.   Objective  Blood pressure 128/84, pulse 60, height 5' 1.5" (1.562 m), weight 147 lb (66.7 kg), SpO2 97 %.  Systems examined below as of 04/22/16 General: NAD A&O  x3 mood, affect normal  HEENT: Pupils equal, extraocular movements intact no nystagmus Respiratory: not short of breath at rest or with speaking Cardiovascular: No lower extremity edema, non tender Skin: Warm dry intact with no signs of infection or rash on extremities or on axial skeleton. Abdomen: Soft nontender, no masses Neuro: Cranial nerves  intact, neurovascularly intact in all extremities with 2+ DTRs and 2+ pulses. Lymph: No lymphadenopathy appreciated today  Gait normal with good balance and coordination.  MSK: Non tender with full range of motion and good  stability and symmetric strength and tone of shoulders, elbows, wrist,  knee hips and ankles bilaterally. Moderate arthritic changes of multiple joints  Neck: Inspection unremarkable. No palpable stepoffs. Negative Spurling's maneuver. Mild improvement in range of motion. Patient is making some progress. No longer having the radiation down the arms as much. Still lacking the last 5-10 of side bending bilaterally Grip strength and sensation normal in bilateral hands Strength good C4 to T1 distribution No sensory change to C4 to T1 Negative Hoffman sign bilaterally Reflexes normal Continued poor posture noted  Osteopathic findings C2 flexed rotated and side bent right C5 flexed rotated and side bent left T4 extended rotated and side bent right T7 extended rotated and side bent left L2 flexed rotated and side bent right     Impression and Recommendations:     This case required medical decision making of moderate complexity.      Note: This dictation was prepared with Dragon dictation along with smaller phrase technology. Any transcriptional errors that result from this process are unintentional.

## 2016-04-22 ENCOUNTER — Encounter: Payer: Self-pay | Admitting: Family Medicine

## 2016-04-22 ENCOUNTER — Ambulatory Visit (INDEPENDENT_AMBULATORY_CARE_PROVIDER_SITE_OTHER): Payer: Medicare Other | Admitting: Family Medicine

## 2016-04-22 VITALS — BP 128/84 | HR 60 | Ht 61.5 in | Wt 147.0 lb

## 2016-04-22 DIAGNOSIS — M542 Cervicalgia: Secondary | ICD-10-CM

## 2016-04-22 DIAGNOSIS — M999 Biomechanical lesion, unspecified: Secondary | ICD-10-CM | POA: Diagnosis not present

## 2016-04-22 DIAGNOSIS — G4486 Cervicogenic headache: Secondary | ICD-10-CM

## 2016-04-22 DIAGNOSIS — R51 Headache: Secondary | ICD-10-CM | POA: Diagnosis not present

## 2016-04-22 NOTE — Assessment & Plan Note (Signed)
I still believe a lot of this is ergonomics and posture. Patient has been sent to formal physical therapy that ankle be beneficial. We discussed icing regimen and home exercises. Patient is responding well to osteopathic manipulation and we will see her again in 4-6 weeks for further evaluation and treatment. Continue all other conservative therapy.

## 2016-04-22 NOTE — Patient Instructions (Signed)
Good to see you  Ice is your friend  Art will call you  Keep working on the posture Continue the vitamins See me again in 4-6 weeks.

## 2016-04-22 NOTE — Assessment & Plan Note (Signed)
Decision today to treat with OMT was based on Physical Exam  After verbal consent patient was treated with ME, FPR, ST techniques in cervical, thoracic and lumbar areas  Patient tolerated the procedure well with improvement in symptoms  Patient given exercises, stretches and lifestyle modifications  See medications in patient instructions if given  Patient will follow up in 4-6 weeks Referred to physical therapy.

## 2016-04-27 ENCOUNTER — Other Ambulatory Visit: Payer: Self-pay | Admitting: Neurology

## 2016-05-04 DIAGNOSIS — M542 Cervicalgia: Secondary | ICD-10-CM | POA: Diagnosis not present

## 2016-05-06 ENCOUNTER — Other Ambulatory Visit: Payer: Self-pay | Admitting: Emergency Medicine

## 2016-05-06 ENCOUNTER — Encounter: Payer: Self-pay | Admitting: Internal Medicine

## 2016-05-06 DIAGNOSIS — R1013 Epigastric pain: Secondary | ICD-10-CM | POA: Diagnosis not present

## 2016-05-06 MED ORDER — CLORAZEPATE DIPOTASSIUM 7.5 MG PO TABS: ORAL_TABLET | ORAL | 1 refills | Status: DC

## 2016-05-06 NOTE — Telephone Encounter (Signed)
Ok to send refill to pharmacy.

## 2016-05-06 NOTE — Telephone Encounter (Signed)
Please advise if okay to fill  

## 2016-05-11 DIAGNOSIS — M542 Cervicalgia: Secondary | ICD-10-CM | POA: Diagnosis not present

## 2016-05-13 DIAGNOSIS — M542 Cervicalgia: Secondary | ICD-10-CM | POA: Diagnosis not present

## 2016-05-18 DIAGNOSIS — M542 Cervicalgia: Secondary | ICD-10-CM | POA: Diagnosis not present

## 2016-05-25 DIAGNOSIS — M542 Cervicalgia: Secondary | ICD-10-CM | POA: Diagnosis not present

## 2016-05-27 ENCOUNTER — Ambulatory Visit: Payer: Medicare Other | Admitting: Family Medicine

## 2016-05-28 DIAGNOSIS — R51 Headache: Secondary | ICD-10-CM | POA: Diagnosis not present

## 2016-05-28 DIAGNOSIS — G43709 Chronic migraine without aura, not intractable, without status migrainosus: Secondary | ICD-10-CM | POA: Insufficient documentation

## 2016-05-28 DIAGNOSIS — F419 Anxiety disorder, unspecified: Secondary | ICD-10-CM | POA: Diagnosis not present

## 2016-05-28 DIAGNOSIS — R635 Abnormal weight gain: Secondary | ICD-10-CM | POA: Diagnosis not present

## 2016-05-28 DIAGNOSIS — G479 Sleep disorder, unspecified: Secondary | ICD-10-CM | POA: Diagnosis not present

## 2016-06-09 ENCOUNTER — Ambulatory Visit: Payer: Medicare Other | Admitting: Neurology

## 2016-06-16 DIAGNOSIS — M542 Cervicalgia: Secondary | ICD-10-CM | POA: Diagnosis not present

## 2016-06-17 DIAGNOSIS — R635 Abnormal weight gain: Secondary | ICD-10-CM | POA: Diagnosis not present

## 2016-06-23 ENCOUNTER — Other Ambulatory Visit: Payer: Self-pay | Admitting: Internal Medicine

## 2016-06-23 MED ORDER — NARATRIPTAN HCL 2.5 MG PO TABS: 2.5000 mg | ORAL_TABLET | Freq: Every day | ORAL | 5 refills | Status: DC | PRN

## 2016-06-23 NOTE — Telephone Encounter (Signed)
Pt left msg on triage stating she need refill on the Naratriptan 2.5 mg.  Was originally rx by previous MD but no longer see him. Is this ok to send...Johny Chess

## 2016-06-23 NOTE — Telephone Encounter (Signed)
RX faxed to [facility]

## 2016-06-30 DIAGNOSIS — E781 Pure hyperglyceridemia: Secondary | ICD-10-CM | POA: Diagnosis not present

## 2016-06-30 DIAGNOSIS — E785 Hyperlipidemia, unspecified: Secondary | ICD-10-CM | POA: Diagnosis not present

## 2016-06-30 DIAGNOSIS — Z6829 Body mass index (BMI) 29.0-29.9, adult: Secondary | ICD-10-CM | POA: Diagnosis not present

## 2016-06-30 DIAGNOSIS — E663 Overweight: Secondary | ICD-10-CM | POA: Diagnosis not present

## 2016-06-30 DIAGNOSIS — R635 Abnormal weight gain: Secondary | ICD-10-CM | POA: Diagnosis not present

## 2016-07-09 ENCOUNTER — Telehealth: Payer: Self-pay | Admitting: Internal Medicine

## 2016-07-09 DIAGNOSIS — G4489 Other headache syndrome: Secondary | ICD-10-CM

## 2016-07-09 DIAGNOSIS — M5481 Occipital neuralgia: Secondary | ICD-10-CM

## 2016-07-09 DIAGNOSIS — F4320 Adjustment disorder, unspecified: Secondary | ICD-10-CM | POA: Diagnosis not present

## 2016-07-09 NOTE — Telephone Encounter (Signed)
Please advise 

## 2016-07-09 NOTE — Telephone Encounter (Signed)
Pt request referral send to [facility]--Dr. Gwendel Hanson for headache. She stating she saw her once before but insurance req referral so they will cover. Please advise.

## 2016-07-10 NOTE — Telephone Encounter (Signed)
Referral ordered

## 2016-07-15 DIAGNOSIS — G43709 Chronic migraine without aura, not intractable, without status migrainosus: Secondary | ICD-10-CM | POA: Diagnosis not present

## 2016-07-15 DIAGNOSIS — M5481 Occipital neuralgia: Secondary | ICD-10-CM | POA: Diagnosis not present

## 2016-07-21 DIAGNOSIS — Z6827 Body mass index (BMI) 27.0-27.9, adult: Secondary | ICD-10-CM | POA: Diagnosis not present

## 2016-07-21 DIAGNOSIS — K5900 Constipation, unspecified: Secondary | ICD-10-CM | POA: Diagnosis not present

## 2016-07-21 DIAGNOSIS — Z713 Dietary counseling and surveillance: Secondary | ICD-10-CM | POA: Diagnosis not present

## 2016-07-21 DIAGNOSIS — R635 Abnormal weight gain: Secondary | ICD-10-CM | POA: Diagnosis not present

## 2016-07-21 DIAGNOSIS — F419 Anxiety disorder, unspecified: Secondary | ICD-10-CM | POA: Diagnosis not present

## 2016-07-21 DIAGNOSIS — E663 Overweight: Secondary | ICD-10-CM | POA: Diagnosis not present

## 2016-07-21 DIAGNOSIS — E781 Pure hyperglyceridemia: Secondary | ICD-10-CM | POA: Diagnosis not present

## 2016-07-21 DIAGNOSIS — E785 Hyperlipidemia, unspecified: Secondary | ICD-10-CM | POA: Diagnosis not present

## 2016-07-23 DIAGNOSIS — F4320 Adjustment disorder, unspecified: Secondary | ICD-10-CM | POA: Diagnosis not present

## 2016-07-30 DIAGNOSIS — F4320 Adjustment disorder, unspecified: Secondary | ICD-10-CM | POA: Diagnosis not present

## 2016-08-06 DIAGNOSIS — F4320 Adjustment disorder, unspecified: Secondary | ICD-10-CM | POA: Diagnosis not present

## 2016-08-13 DIAGNOSIS — F5081 Binge eating disorder: Secondary | ICD-10-CM | POA: Diagnosis not present

## 2016-08-17 DIAGNOSIS — E663 Overweight: Secondary | ICD-10-CM | POA: Diagnosis not present

## 2016-08-17 DIAGNOSIS — Z6826 Body mass index (BMI) 26.0-26.9, adult: Secondary | ICD-10-CM | POA: Diagnosis not present

## 2016-08-17 DIAGNOSIS — Z713 Dietary counseling and surveillance: Secondary | ICD-10-CM | POA: Diagnosis not present

## 2016-08-25 DIAGNOSIS — E663 Overweight: Secondary | ICD-10-CM | POA: Diagnosis not present

## 2016-08-25 DIAGNOSIS — S8010XA Contusion of unspecified lower leg, initial encounter: Secondary | ICD-10-CM | POA: Diagnosis not present

## 2016-08-25 DIAGNOSIS — F5081 Binge eating disorder: Secondary | ICD-10-CM | POA: Diagnosis not present

## 2016-08-25 DIAGNOSIS — Z6826 Body mass index (BMI) 26.0-26.9, adult: Secondary | ICD-10-CM | POA: Diagnosis not present

## 2016-08-26 ENCOUNTER — Other Ambulatory Visit: Payer: Self-pay | Admitting: Internal Medicine

## 2016-08-26 DIAGNOSIS — M792 Neuralgia and neuritis, unspecified: Secondary | ICD-10-CM | POA: Diagnosis not present

## 2016-08-26 DIAGNOSIS — M542 Cervicalgia: Secondary | ICD-10-CM | POA: Diagnosis not present

## 2016-08-26 DIAGNOSIS — G4700 Insomnia, unspecified: Secondary | ICD-10-CM | POA: Diagnosis not present

## 2016-08-26 DIAGNOSIS — G43709 Chronic migraine without aura, not intractable, without status migrainosus: Secondary | ICD-10-CM | POA: Diagnosis not present

## 2016-08-26 DIAGNOSIS — Z79899 Other long term (current) drug therapy: Secondary | ICD-10-CM | POA: Diagnosis not present

## 2016-08-26 DIAGNOSIS — R635 Abnormal weight gain: Secondary | ICD-10-CM | POA: Diagnosis not present

## 2016-08-26 DIAGNOSIS — F419 Anxiety disorder, unspecified: Secondary | ICD-10-CM | POA: Diagnosis not present

## 2016-08-27 DIAGNOSIS — F54 Psychological and behavioral factors associated with disorders or diseases classified elsewhere: Secondary | ICD-10-CM | POA: Diagnosis not present

## 2016-08-27 DIAGNOSIS — F5081 Binge eating disorder: Secondary | ICD-10-CM | POA: Diagnosis not present

## 2016-08-31 DIAGNOSIS — N9411 Superficial (introital) dyspareunia: Secondary | ICD-10-CM | POA: Diagnosis not present

## 2016-08-31 DIAGNOSIS — N952 Postmenopausal atrophic vaginitis: Secondary | ICD-10-CM | POA: Diagnosis not present

## 2016-09-03 ENCOUNTER — Ambulatory Visit (INDEPENDENT_AMBULATORY_CARE_PROVIDER_SITE_OTHER): Payer: Self-pay | Admitting: Orthopedic Surgery

## 2016-09-03 IMAGING — MG MM DIAGNOSTIC UNILATERAL R
3 series · 3 of 3 positions shown · non-contrast
Comparison: Prior exam

CLINICAL DATA: Patient with a palpable thickening either in the
upper outer aspect of the right breast were 6 o'clock region. 6
o'clock was noted on the exam order. Abnormality was felt by the
patient's referring clinician

EXAM:
DIGITAL DIAGNOSTIC  RIGHT MAMMOGRAM
ULTRASOUND RIGHT BREAST

[R CC]
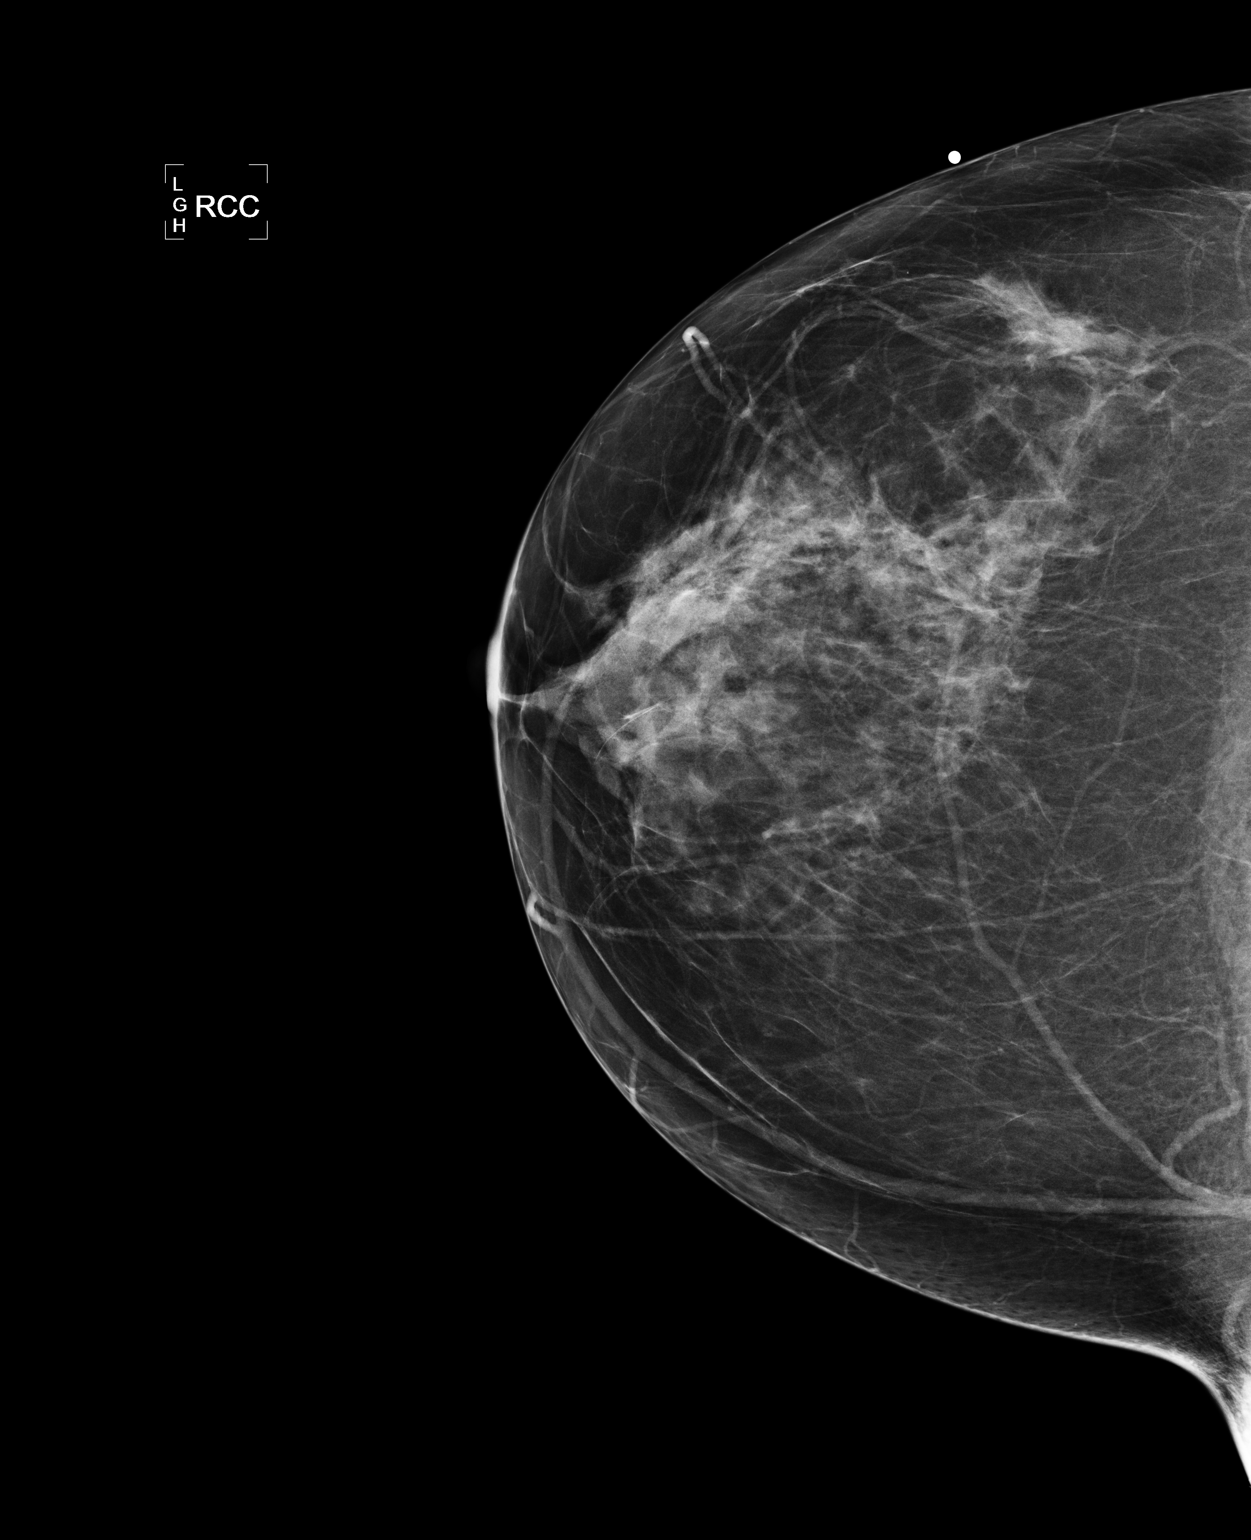

[R MLO]
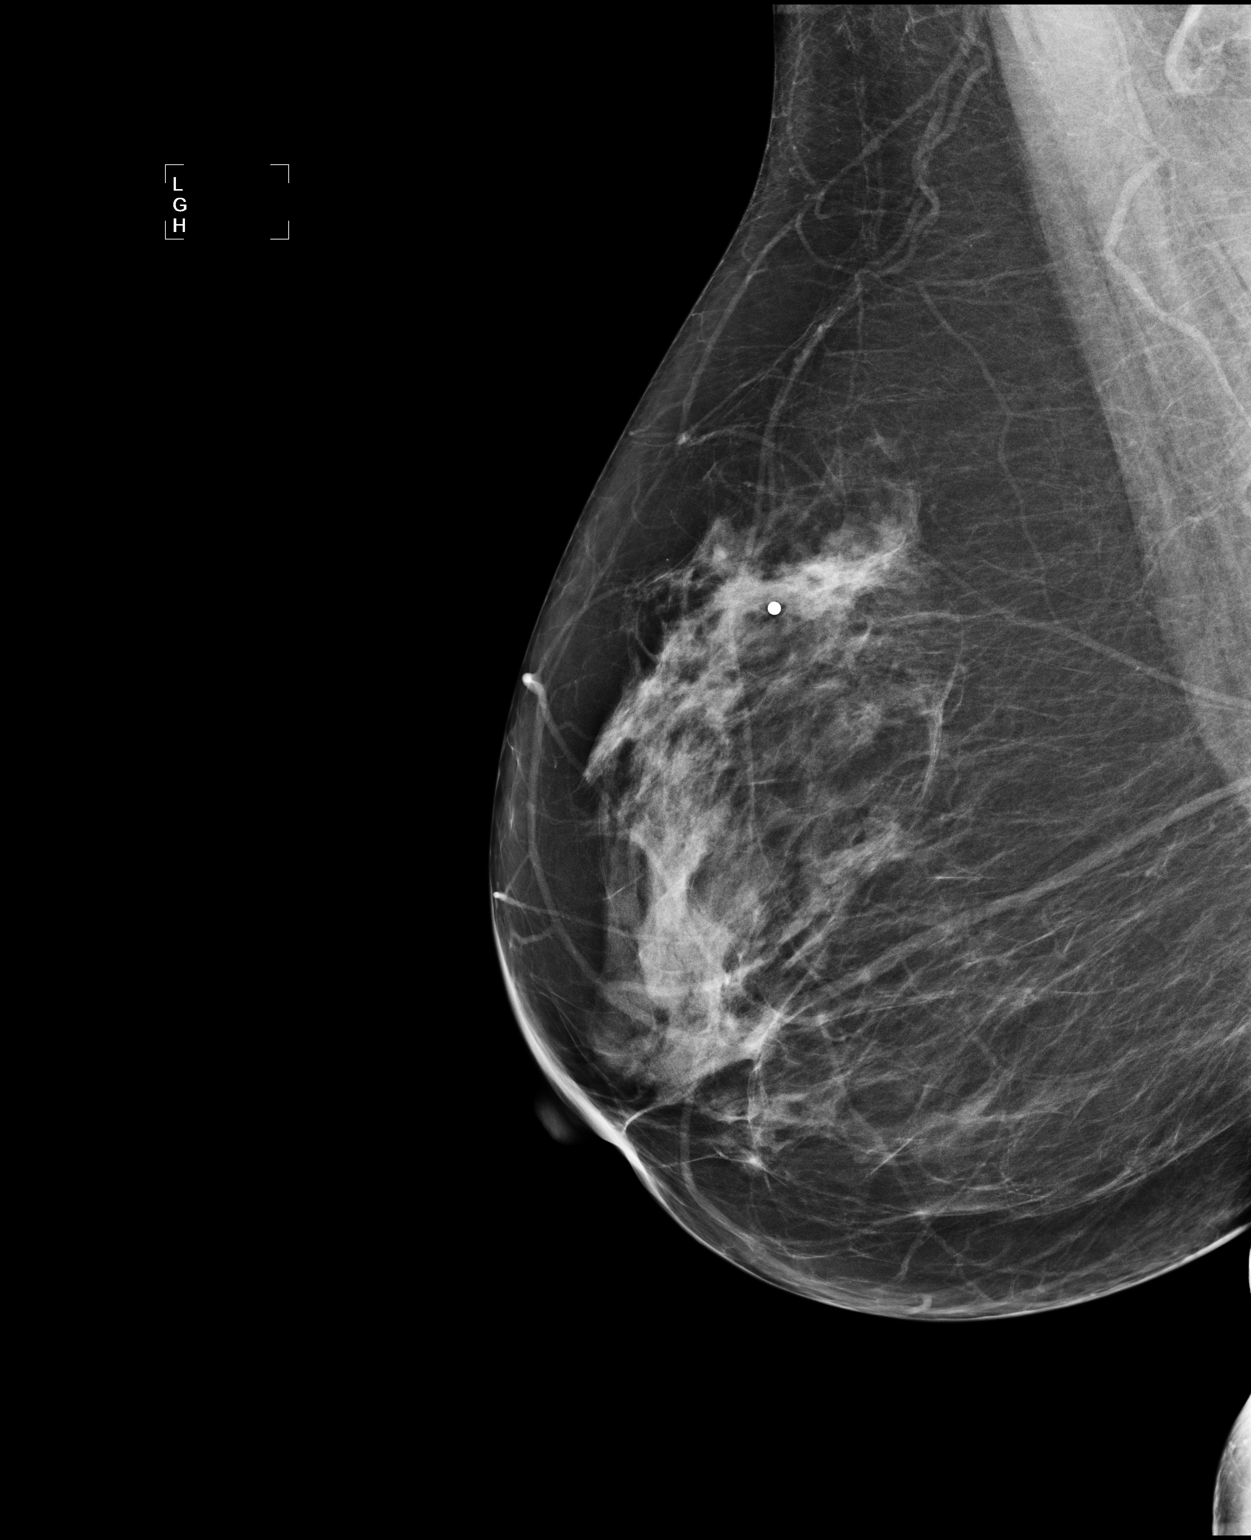

[R TAN]
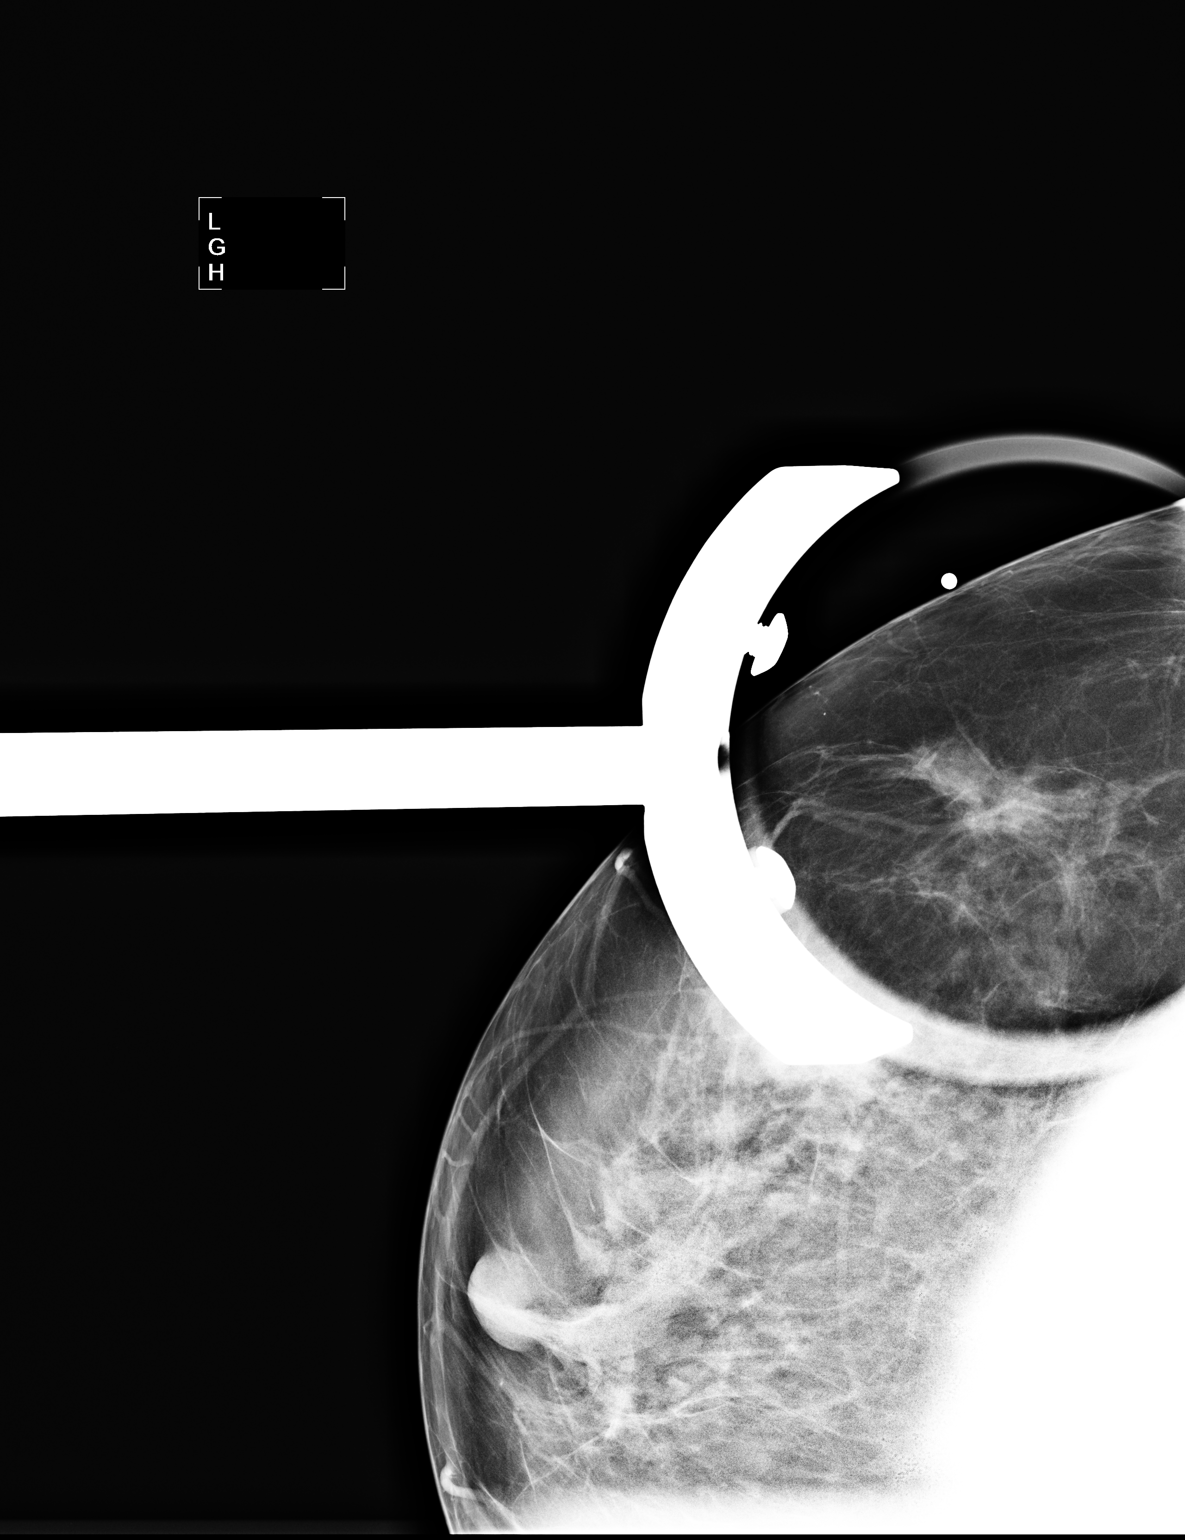

[3 of 3 positions shown; findings below may reference images not displayed]

ACR Breast Density Category c: The breast tissue is heterogeneously
dense, which may obscure small masses.
FINDINGS: There are no discrete masses, areas of architectural distortion,
areas of significant asymmetry or suspicious calcifications. No
mammographic change from prior studies.

On physical exam, there is some prominence of the fibroglandular
tissue in the infra-areolar region and upper outer quadrant without
a discrete mass.

Ultrasound is performed, showing normal fibroglandular and fibro
fatty tissue throughout the upper outer and inferior aspect of the
right breast. No mass or cyst.
IMPRESSION: Normal exam.  No evidence of malignancy.

RECOMMENDATION:
Screening mammogram in one year.(Code:[id number])

I have discussed the findings and recommendations with the patient.
Results were also provided in writing at the conclusion of the
visit. If applicable, a reminder letter will be sent to the patient
regarding the next appointment.

BI-RADS CATEGORY  1: Negative.

## 2016-09-04 DIAGNOSIS — L648 Other androgenic alopecia: Secondary | ICD-10-CM | POA: Diagnosis not present

## 2016-09-04 DIAGNOSIS — L218 Other seborrheic dermatitis: Secondary | ICD-10-CM | POA: Diagnosis not present

## 2016-09-04 DIAGNOSIS — Z1283 Encounter for screening for malignant neoplasm of skin: Secondary | ICD-10-CM | POA: Diagnosis not present

## 2016-09-08 DIAGNOSIS — M792 Neuralgia and neuritis, unspecified: Secondary | ICD-10-CM | POA: Diagnosis not present

## 2016-09-08 DIAGNOSIS — M542 Cervicalgia: Secondary | ICD-10-CM | POA: Diagnosis not present

## 2016-09-08 DIAGNOSIS — M899 Disorder of bone, unspecified: Secondary | ICD-10-CM | POA: Diagnosis not present

## 2016-09-08 DIAGNOSIS — M5481 Occipital neuralgia: Secondary | ICD-10-CM | POA: Diagnosis not present

## 2016-09-08 DIAGNOSIS — M47812 Spondylosis without myelopathy or radiculopathy, cervical region: Secondary | ICD-10-CM | POA: Diagnosis not present

## 2016-09-08 DIAGNOSIS — M9971 Connective tissue and disc stenosis of intervertebral foramina of cervical region: Secondary | ICD-10-CM | POA: Diagnosis not present

## 2016-09-10 ENCOUNTER — Telehealth: Payer: Self-pay | Admitting: Internal Medicine

## 2016-09-10 DIAGNOSIS — M899 Disorder of bone, unspecified: Secondary | ICD-10-CM

## 2016-09-10 NOTE — Telephone Encounter (Signed)
NP calling in regard to a legion she has found on patients neck.  Is requesting call back within the hour as she will be out of the office all next week on Spring Break.  Would like to communicate this with clinical ASAP since Dr. Quay Burow is out of the office this afternoon.

## 2016-09-10 NOTE — Telephone Encounter (Signed)
If it is a lesion on her neck it sounds like she needs to see derm to have it evaluated first - possibly biopsied and then referred to oncology if needed.  Oncology can not do anything with just a lesion.    Let me know and I can refer.

## 2016-09-10 NOTE — Telephone Encounter (Signed)
I have talked with granetzke at [facility]---she is recommending a referral to cancer center/oncology---it could just be a clump of blood vessels, but she's not sure---and granetzke will be out of the office all next week---wanted to have referral placed before that, if dr burns feels this is a good option for the patient---if you need to talk with someone at [facility] while granetzke is out of office, you can call and ask for dr. Altamese Cabal who will be handling her things---routing to dr burns, please advise, I will call patient if you need to advise anything different from what's in this message, patient has been advised that granetzke is recommending referral to oncology---you can also see that visit/test on care everywhere if you want to look at test results

## 2016-09-10 NOTE — Telephone Encounter (Signed)
I have left message for granetzke to call Darvis Croft back on cell phone

## 2016-09-10 NOTE — Telephone Encounter (Signed)
[facility] wanted to make sure you did understand that lesion is on patient's T1 vertebrae and not on her skin----you can take over care at this point and order/refer wherever you deem best---[facility] will let patient know that they have informed dr burns and our office will contact patient as to what referral or order dr burns feels is appropriate---routing to dr burns, please advise and I will let patient know next week what plan of care at this point will be, thanks

## 2016-09-14 DIAGNOSIS — M899 Disorder of bone, unspecified: Secondary | ICD-10-CM | POA: Insufficient documentation

## 2016-09-14 NOTE — Telephone Encounter (Signed)
Contacted Neurology office to have results for MRI faxed over. A message as also been sent to a nurse within the office to inform us as to what needs to be done with the pt.

## 2016-09-14 NOTE — Telephone Encounter (Signed)
Pt called again regarding MRI. I am unsure what is going on or what to tell pt because I was not here Thursday afternoon. I do not see the MRI results in Care Everywhere. Do I need to call the neurology office to get a report faxed over?

## 2016-09-14 NOTE — Telephone Encounter (Signed)
Spoke with nurse at Neurology office. She is going to fax the results over. She states that the patient was not seen for an office visit she was seen for a nerve block. They are unable to refer to any other specialist and that it why they had to referral back to Dr Quay Burow.

## 2016-09-14 NOTE — Telephone Encounter (Signed)
MRI report shows an atypical signal in T1.  Will refer to Dr Ellene Route (neurosurgery) for further evaluation.

## 2016-09-15 DIAGNOSIS — Z6826 Body mass index (BMI) 26.0-26.9, adult: Secondary | ICD-10-CM | POA: Diagnosis not present

## 2016-09-15 DIAGNOSIS — M4722 Other spondylosis with radiculopathy, cervical region: Secondary | ICD-10-CM | POA: Diagnosis not present

## 2016-09-15 DIAGNOSIS — R030 Elevated blood-pressure reading, without diagnosis of hypertension: Secondary | ICD-10-CM | POA: Diagnosis not present

## 2016-09-15 NOTE — Telephone Encounter (Signed)
If it was just for this ok to cancel since there is nothing else I can do.  Does she have an appt with Dr Ellene Route?

## 2016-09-15 NOTE — Telephone Encounter (Signed)
Spoke with pt to inform we can cancel appt.

## 2016-09-15 NOTE — Telephone Encounter (Signed)
Spoke with pt to inform. Pt would like to know if she should keep her appt for Thursday or cancel it.Kylie Wheeler

## 2016-09-17 ENCOUNTER — Ambulatory Visit: Payer: Medicare Other | Admitting: Internal Medicine

## 2016-09-17 DIAGNOSIS — F5081 Binge eating disorder: Secondary | ICD-10-CM | POA: Diagnosis not present

## 2016-09-23 ENCOUNTER — Other Ambulatory Visit: Payer: Self-pay | Admitting: Internal Medicine

## 2016-09-24 DIAGNOSIS — F5081 Binge eating disorder: Secondary | ICD-10-CM | POA: Diagnosis not present

## 2016-10-01 DIAGNOSIS — Z713 Dietary counseling and surveillance: Secondary | ICD-10-CM | POA: Diagnosis not present

## 2016-10-01 DIAGNOSIS — F5081 Binge eating disorder: Secondary | ICD-10-CM | POA: Diagnosis not present

## 2016-10-01 DIAGNOSIS — E663 Overweight: Secondary | ICD-10-CM | POA: Diagnosis not present

## 2016-10-01 DIAGNOSIS — Z6825 Body mass index (BMI) 25.0-25.9, adult: Secondary | ICD-10-CM | POA: Diagnosis not present

## 2016-10-05 ENCOUNTER — Telehealth: Payer: Self-pay | Admitting: Internal Medicine

## 2016-10-07 DIAGNOSIS — L218 Other seborrheic dermatitis: Secondary | ICD-10-CM | POA: Diagnosis not present

## 2016-10-07 DIAGNOSIS — L718 Other rosacea: Secondary | ICD-10-CM | POA: Diagnosis not present

## 2016-10-08 MED ORDER — CLORAZEPATE DIPOTASSIUM 7.5 MG PO TABS: 7.5000 mg | ORAL_TABLET | Freq: Every evening | ORAL | 0 refills | Status: DC | PRN

## 2016-10-08 NOTE — Telephone Encounter (Signed)
Patient has set up her CPE for July 2 at 2pm. Patient informed we will send refilled until her appointment. If she cancels or does not show we will not give no more refills. Thank you.

## 2016-10-08 NOTE — Addendum Note (Signed)
Addended by: Terence Lux B on: 10/08/2016 12:57 PM   Modules accepted: Orders

## 2016-10-13 DIAGNOSIS — M4722 Other spondylosis with radiculopathy, cervical region: Secondary | ICD-10-CM | POA: Diagnosis not present

## 2016-10-15 DIAGNOSIS — F54 Psychological and behavioral factors associated with disorders or diseases classified elsewhere: Secondary | ICD-10-CM | POA: Diagnosis not present

## 2016-10-15 DIAGNOSIS — F5081 Binge eating disorder: Secondary | ICD-10-CM | POA: Diagnosis not present

## 2016-10-20 DIAGNOSIS — Z713 Dietary counseling and surveillance: Secondary | ICD-10-CM | POA: Diagnosis not present

## 2016-10-20 DIAGNOSIS — R635 Abnormal weight gain: Secondary | ICD-10-CM | POA: Diagnosis not present

## 2016-10-20 DIAGNOSIS — E663 Overweight: Secondary | ICD-10-CM | POA: Diagnosis not present

## 2016-10-20 DIAGNOSIS — Z6825 Body mass index (BMI) 25.0-25.9, adult: Secondary | ICD-10-CM | POA: Diagnosis not present

## 2016-10-29 ENCOUNTER — Ambulatory Visit (INDEPENDENT_AMBULATORY_CARE_PROVIDER_SITE_OTHER): Payer: Medicare Other | Admitting: Orthopedic Surgery

## 2016-10-29 ENCOUNTER — Encounter (INDEPENDENT_AMBULATORY_CARE_PROVIDER_SITE_OTHER): Payer: Self-pay | Admitting: Orthopedic Surgery

## 2016-10-29 DIAGNOSIS — M25561 Pain in right knee: Secondary | ICD-10-CM

## 2016-10-29 DIAGNOSIS — Z713 Dietary counseling and surveillance: Secondary | ICD-10-CM | POA: Diagnosis not present

## 2016-10-29 DIAGNOSIS — G8929 Other chronic pain: Secondary | ICD-10-CM

## 2016-10-29 DIAGNOSIS — E663 Overweight: Secondary | ICD-10-CM | POA: Diagnosis not present

## 2016-10-29 DIAGNOSIS — Z6825 Body mass index (BMI) 25.0-25.9, adult: Secondary | ICD-10-CM | POA: Diagnosis not present

## 2016-10-30 NOTE — Progress Notes (Signed)
Office Visit Note   Patient: Kylie Wheeler           Date of Birth: 27-Mar-1946           MRN: [id number] Visit Date: 10/29/2016 Requested by: Binnie Rail, MD [place], [place] [address] PCP: Binnie Rail, MD  Subjective: Chief Complaint  Patient presents with  . Right Knee - Pain    HPI: Kylie Wheeler is a 71 year old patient with right knee pain.  She had an injection September 2017.  Did very well until Saturday when she started having pain again.  She reports pain mostly with walking and stairs.  She has some difficulty bending the knee.  Patient states that "I think it's something different than before".  She does have a history of small medial meniscal tear by MRI scan last year.  The pain just started Saturday she denies any numbness and tingling or any mechanical symptoms.              ROS: All systems reviewed are negative as they relate to the chief complaint within the history of present illness.  Patient denies  fevers or chills.   Assessment & Plan: Visit Diagnoses:  1. Chronic pain of right knee     Plan: Impression is right knee pain with history of small medial meniscal tear in the past.  Currently she has no effusion and no real focal joint line tenderness.  I like to try anti-inflammatory Duexis first and then if that doesn't help come back in for an injection and then if that doesn't help then potentially arthroscopic intervention would be indicated.  Continue with nonweightbearing quad strengthening exercises.  She'll come back if the Duexis doesn't help Follow-Up Instructions: Return if symptoms worsen or fail to improve.   Orders:  No orders of the defined types were placed in this encounter.  No orders of the defined types were placed in this encounter.     Procedures: No procedures performed   Clinical Data: No additional findings.  Objective: Vital Signs: There were no vitals taken for this visit.  Physical Exam:   Constitutional:  Patient appears well-developed HEENT:  Head: Normocephalic Eyes:EOM are normal Neck: Normal range of motion Cardiovascular: Normal rate Pulmonary/chest: Effort normal Neurologic: Patient is alert Skin: Skin is warm Psychiatric: Patient has normal mood and affect    Ortho Exam: Examination the right knee demonstrates full active and passive range of motion of the right knee with stable collateral cruciate ligaments intact extensor mechanism and no focal joint line tenderness.  Extensor mechanism is intact.  No groin pain with internal/external rotation of the leg.  No effusion.  Pedal pulses palpable.  Specialty Comments:  No specialty comments available.  Imaging: No results found.   PMFS History: Patient Active Problem List   Diagnosis Date Noted  . Lesion of vertebra, T1 09/14/2016  . Cervicogenic headache 03/30/2016  . Nonallopathic lesion of cervical region 03/30/2016  . Nonallopathic lesion of thoracic region 03/30/2016  . Nonallopathic lesion of lumbosacral region 03/30/2016  . Occipital neuralgia 12/11/2015  . Allergic reaction 03/14/2014  . Pain in joint, upper arm 03/14/2014  . Syncope 11/29/2012  . Vitamin D deficiency 08/14/2010  . OSTEOPOROSIS 08/14/2010  . COLONIC POLYPS, HX OF 08/14/2010  . VERTIGO 08/22/2007  . JAW PAIN 03/29/2007  . HYPERLIPIDEMIA 01/12/2007  . INSOMNIA, CHRONIC 01/12/2007  . Headache 01/12/2007   Past Medical History:  Diagnosis Date  . Bladder problem   .  Hyperlipidemia   . Insomnia   . Migraines   . Osteoporosis   . Vitamin D deficiency     Family History  Problem Relation Age of Onset  . Stroke Mother   . Heart attack Mother   . Atrial fibrillation Mother   . Heart failure Mother   . Stroke Father   . Heart failure Father   . Hyperlipidemia Brother   . Hypertension Brother   . Heart disease Maternal Grandfather   . Heart disease Paternal Grandmother   . Heart disease Paternal Grandfather   . Hyperlipidemia Sister     . Hyperlipidemia Brother   . Hypertension Brother     Past Surgical History:  Procedure Laterality Date  . ADENOIDECTOMY    . TUBAL LIGATION     Social History   Occupational History  . retired-- IT trainer    Social History Main Topics  . Smoking status: Never Smoker  . Smokeless tobacco: Never Used  . Alcohol use No  . Drug use: No  . Sexual activity: Not Currently    Partners: Male     Comment: pt actually said whether she is sexually active or not is none of the govt business

## 2016-11-06 ENCOUNTER — Other Ambulatory Visit: Payer: Self-pay | Admitting: Internal Medicine

## 2016-11-30 DIAGNOSIS — M4722 Other spondylosis with radiculopathy, cervical region: Secondary | ICD-10-CM | POA: Diagnosis not present

## 2016-12-06 ENCOUNTER — Other Ambulatory Visit: Payer: Self-pay | Admitting: Internal Medicine

## 2016-12-07 NOTE — Telephone Encounter (Signed)
RX faxed to [facility] 

## 2016-12-09 DIAGNOSIS — L821 Other seborrheic keratosis: Secondary | ICD-10-CM | POA: Diagnosis not present

## 2016-12-09 DIAGNOSIS — L578 Other skin changes due to chronic exposure to nonionizing radiation: Secondary | ICD-10-CM | POA: Diagnosis not present

## 2016-12-09 DIAGNOSIS — L708 Other acne: Secondary | ICD-10-CM | POA: Diagnosis not present

## 2016-12-10 ENCOUNTER — Telehealth: Payer: Self-pay | Admitting: Internal Medicine

## 2016-12-10 NOTE — Telephone Encounter (Signed)
Called pt on 12/10/2016, lvm. Pt has Medicare Part A & B this ins does not cover CPE. Visit type (OV yearly F/U) will be changed once pt confirms.

## 2016-12-13 NOTE — Patient Instructions (Addendum)
  Medications reviewed and updated.  Changes include restarting effexor.   A referral for [facility] or [facility] neurology will be place.   Please followup in one year

## 2016-12-13 NOTE — Progress Notes (Signed)
Subjective:    Patient ID: Kylie Wheeler, female    DOB: [birth year], 71 y.o.   MRN: [id number]  HPI She is here for follow up.   Posterior headaches:  She continues to have headaches that start in the posterior part of her head at the base of her skull and radiates around to her frontal aspect of her head.  If she sleep with her head propped up in a certain position it helps.  If she walks first thing in the morning the headache tends to go away.  If her head is in a certain position it will cause the headache.  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  She then saw neurology and she referred her to pain management.  She did have an injection that helped some left sided neck and arm pain she was having, but it did not address her headaches.  She was started on effexor by neurology and thinks that helped with anxiety and her headaches.  She is not currently taking it but wonders if she should restart. She takes excedrin migraine and it helps.  She denies any numbness/tingling or weakness in her arms.    She is not sure if arthritis is causing the headaches or what.  She wants to know what is causing them and what can be done.    Anxiety:  She was put on effexor by neurology and it helped with anxiety.  She is an anxious person and felt better on the medication.    Insomnia;  She is taking tranxene nightly  She has been on it for years and denies side effects.    Medications and allergies reviewed with patient and updated if appropriate.  Patient Active Problem List   Diagnosis Date Noted  . Lesion of vertebra, T1 09/14/2016  . Cervicogenic headache 03/30/2016  . Nonallopathic lesion of cervical region 03/30/2016  . Nonallopathic lesion of thoracic region 03/30/2016  . Nonallopathic lesion of lumbosacral region 03/30/2016  . Occipital neuralgia 12/11/2015  . Allergic reaction 03/14/2014  . Pain in joint, upper arm 03/14/2014  . Syncope  11/29/2012  . Vitamin D deficiency 08/14/2010  . OSTEOPOROSIS 08/14/2010  . COLONIC POLYPS, HX OF 08/14/2010  . VERTIGO 08/22/2007  . JAW PAIN 03/29/2007  . HYPERLIPIDEMIA 01/12/2007  . INSOMNIA, CHRONIC 01/12/2007  . Headache 01/12/2007    Current Outpatient Prescriptions on File Prior to Visit  Medication Sig Dispense Refill  . acyclovir (ZOVIRAX) 200 MG capsule Take 200 mg by mouth as needed.     . Cholecalciferol (VITAMIN D3) 2000 UNITS TABS Take 1 tablet by mouth daily.    . clorazepate (TRANXENE) 7.5 MG tablet Take 1 tablet (7.5 mg total) by mouth at bedtime as needed. --- Office visit needed for further refills 30 tablet 0  . DOCOSAHEXAENOIC ACID PO Take 1 g by mouth.    . fluticasone (FLONASE) 50 MCG/ACT nasal spray Reported on 12/11/2015  5  . naratriptan (AMERGE) 2.5 MG tablet Take 1 tablet (2.5 mg total) by mouth daily as needed for migraine (migraine). 10 tablet 5  . PREMARIN vaginal cream   12  . tiZANidine (ZANAFLEX) 2 MG tablet TAKE 1 TABLET(2 MG) BY MOUTH EVERY 8 HOURS AS NEEDED FOR MUSCLE SPASMS 90 tablet 0  . venlafaxine (EFFEXOR) 37.5 MG tablet   2   No current facility-administered medications on file prior to visit.     Past Medical History:  Diagnosis Date  . Bladder problem   . Hyperlipidemia   . Insomnia   . Migraines   . Osteoporosis   . Vitamin D deficiency     Past Surgical History:  Procedure Laterality Date  . ADENOIDECTOMY    . TUBAL LIGATION      Social History   Social History  . Marital status: Widowed    Spouse name: N/A  . Number of children: N/A  . Years of education: N/A   Occupational History  . retired-- IT trainer    Social History Main Topics  . Smoking status: Never Smoker  . Smokeless tobacco: Never Used  . Alcohol use No  . Drug use: No  . Sexual activity: Not Currently    Partners: Male     Comment: pt actually said whether she is sexually active or not is none of the govt business   Other Topics Concern  .  None   Social History Narrative  . None    Family History  Problem Relation Age of Onset  . Stroke Mother   . Heart attack Mother   . Atrial fibrillation Mother   . Heart failure Mother   . Stroke Father   . Heart failure Father   . Hyperlipidemia Brother   . Hypertension Brother   . Heart disease Maternal Grandfather   . Heart disease Paternal Grandmother   . Heart disease Paternal Grandfather   . Hyperlipidemia Sister   . Hyperlipidemia Brother   . Hypertension Brother     Review of Systems  Constitutional: Negative for chills and fever.  Respiratory: Negative for cough, shortness of breath and wheezing.   Cardiovascular: Negative for chest pain, palpitations and leg swelling.  Neurological: Positive for light-headedness (occasional) and headaches. Negative for weakness and numbness.       Objective:   Vitals:   12/14/16 1417  BP: 132/76  Pulse: (!) 54  Resp: 16  Temp: 97.9 F (36.6 C)   Filed Weights   12/14/16 1417  Weight: 131 lb (59.4 kg)   Body mass index is 24.35 kg/m.  Wt Readings from Last 3 Encounters:  12/14/16 131 lb (59.4 kg)  04/22/16 147 lb (66.7 kg)  03/30/16 147 lb (66.7 kg)     Physical Exam Constitutional: Appears well-developed and well-nourished. No distress.  HENT:  Head: Normocephalic and atraumatic.  Neck: Neck supple. No tracheal deviation present. No thyromegaly present.  No cervical lymphadenopathy Cardiovascular: Normal rate, regular rhythm and normal heart sounds.   No murmur heard. No carotid bruit .  No edema Pulmonary/Chest: Effort normal and breath sounds normal. No respiratory distress. No has no wheezes. No rales.  MSK:  Tenderness along base of skull, no cervical or thoracic paravetebral muscle tenderness, FROM of neck Skin: Skin is warm and dry. Not diaphoretic.  Psychiatric: Normal mood and affect. Behavior is normal.         Assessment & Plan:   See Problem List for Assessment and Plan of chronic medical  problems.

## 2016-12-14 ENCOUNTER — Encounter: Payer: Self-pay | Admitting: Internal Medicine

## 2016-12-14 ENCOUNTER — Ambulatory Visit (INDEPENDENT_AMBULATORY_CARE_PROVIDER_SITE_OTHER): Payer: Medicare Other | Admitting: Internal Medicine

## 2016-12-14 VITALS — BP 132/76 | HR 54 | Temp 97.9°F | Resp 16 | Ht 61.5 in | Wt 131.0 lb

## 2016-12-14 DIAGNOSIS — R51 Headache: Secondary | ICD-10-CM

## 2016-12-14 DIAGNOSIS — G4700 Insomnia, unspecified: Secondary | ICD-10-CM

## 2016-12-14 DIAGNOSIS — F419 Anxiety disorder, unspecified: Secondary | ICD-10-CM | POA: Insufficient documentation

## 2016-12-14 DIAGNOSIS — G4486 Cervicogenic headache: Secondary | ICD-10-CM

## 2016-12-14 NOTE — Assessment & Plan Note (Signed)
?   Cause unknown - still having headaches Head position does make a difference, walking in the morning helps.  Excedrin migraine helps.  Should see neurology again -- needs referral - will go to [facility] or [facility] Can retry tizanidine

## 2016-12-14 NOTE — Assessment & Plan Note (Signed)
Has been on tranxene for years - take nightly without side effects Has not needed to increase dose Will continue at current dose

## 2016-12-14 NOTE — Assessment & Plan Note (Signed)
Improved when she was taking effexor - will restart Taper up dose like she did before to goal dose of 75 mg twice daily.

## 2016-12-23 ENCOUNTER — Encounter (INDEPENDENT_AMBULATORY_CARE_PROVIDER_SITE_OTHER): Payer: Self-pay | Admitting: Orthopedic Surgery

## 2016-12-23 ENCOUNTER — Ambulatory Visit (INDEPENDENT_AMBULATORY_CARE_PROVIDER_SITE_OTHER): Payer: Medicare Other | Admitting: Orthopedic Surgery

## 2016-12-23 ENCOUNTER — Other Ambulatory Visit: Payer: Self-pay | Admitting: Internal Medicine

## 2016-12-23 DIAGNOSIS — S83241D Other tear of medial meniscus, current injury, right knee, subsequent encounter: Secondary | ICD-10-CM | POA: Diagnosis not present

## 2016-12-23 DIAGNOSIS — R519 Headache, unspecified: Secondary | ICD-10-CM

## 2016-12-23 DIAGNOSIS — R51 Headache: Principal | ICD-10-CM

## 2016-12-23 DIAGNOSIS — G8929 Other chronic pain: Secondary | ICD-10-CM

## 2016-12-23 MED ORDER — LIDOCAINE HCL 1 % IJ SOLN
5.0000 mL | INTRAMUSCULAR | Status: AC | PRN
  Administered 2016-12-23: 5 mL

## 2016-12-23 MED ORDER — BUPIVACAINE HCL 0.25 % IJ SOLN
4.0000 mL | INTRAMUSCULAR | Status: AC | PRN
  Administered 2016-12-23: 4 mL via INTRA_ARTICULAR

## 2016-12-23 MED ORDER — METHYLPREDNISOLONE ACETATE 40 MG/ML IJ SUSP
40.0000 mg | INTRAMUSCULAR | Status: AC | PRN
  Administered 2016-12-23: 40 mg via INTRA_ARTICULAR

## 2016-12-23 NOTE — Progress Notes (Signed)
Office Visit Note   Patient: Kylie Wheeler           Date of Birth: Aug 16, 1945           MRN: [id number] Visit Date: 12/23/2016 Requested by: Binnie Rail, MD [place], [place] [address] PCP: Binnie Rail, MD  Subjective: Chief Complaint  Patient presents with  . Right Knee - Pain    HPI: Kylie Wheeler is a 71 year old patient with right knee pain.  She has a known small medial meniscal tear.  She was asymptomatic after taking a 9 day course of 2 axis.  She states that 2 weeks ago she was standing at the soccer game and had increased medial sided pain.  She wants to walk for exercise.  She is Back from the beach.  It hurts her to stand as well as to sit.  Rates the pain as 3 out of 10 which for her is livable.  She feelsl like it is slightly more of a constant pain than what she had in the past              ROS: All systems reviewed are negative as they relate to the chief complaint within the history of present illness.  Patient denies  fevers or chills.   Assessment & Plan: Visit Diagnoses: No diagnosis found.  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  Plan is to do an injection today into the knee and then give her 5 day course of 2 axis with 4 tablets left to be taken as needed.  I'll see her back as needed.  Arthroscopic intervention would be a consideration as the next step of intervention or repeat injection after a few months.  Discussed nonweightbearing quad strengthening exercises as well.  Follow-Up Instructions: No Follow-up on file.   Orders:  No orders of the defined types were placed in this encounter.  No orders of the defined types were placed in this encounter.     Procedures: Large Joint Inj Date/Time: 12/23/2016 11:47 AM Performed by: Meredith Pel Authorized by: Meredith Pel   Consent Given by:  Patient Site marked: the procedure site was marked    Timeout: prior to procedure the correct patient, procedure, and site was verified   Indications:  Pain, joint swelling and diagnostic evaluation Location:  Knee Site:  R knee Prep: patient was prepped and draped in usual sterile fashion   Needle Size:  18 G Needle Length:  1.5 inches Approach:  Superolateral Ultrasound Guidance: No   Fluoroscopic Guidance: No   Arthrogram: No   Medications:  5 mL lidocaine 1 %; 4 mL bupivacaine 0.25 %; 40 mg methylPREDNISolone acetate 40 MG/ML Patient tolerance:  Patient tolerated the procedure well with no immediate complications     Clinical Data: No additional findings.  Objective: Vital Signs: There were no vitals taken for this visit.  Physical Exam:   Constitutional: Patient appears well-developed HEENT:  Head: Normocephalic Eyes:EOM are normal Neck: Normal range of motion Cardiovascular: Normal rate Pulmonary/chest: Effort normal Neurologic: Patient is alert Skin: Skin is warm Psychiatric: Patient has normal mood and affect    Ortho Exam: Orthopedic exam demonstrates normal gait and alignment no effusion mild medial joint line tenderness on the right full range of motion stable collateral and cruciate ligaments no other masses lymph adenopathy or skin changes noted in the right knee region  Specialty Comments:  No specialty comments available.  Imaging: No results found.   PMFS History: Patient Active Problem List   Diagnosis Date Noted  . Anxiety 12/14/2016  . Lesion of vertebra, T1 09/14/2016  . Cervicogenic headache 03/30/2016  . Nonallopathic lesion of cervical region 03/30/2016  . Nonallopathic lesion of thoracic region 03/30/2016  . Nonallopathic lesion of lumbosacral region 03/30/2016  . Allergic reaction 03/14/2014  . Syncope 11/29/2012  . Vitamin D deficiency 08/14/2010  . OSTEOPOROSIS 08/14/2010  . COLONIC POLYPS, HX OF 08/14/2010  . VERTIGO 08/22/2007  . HYPERLIPIDEMIA 01/12/2007  . INSOMNIA, CHRONIC  01/12/2007  . Headache 01/12/2007   Past Medical History:  Diagnosis Date  . Bladder problem   . Hyperlipidemia   . Insomnia   . Migraines   . Osteoporosis   . Vitamin D deficiency     Family History  Problem Relation Age of Onset  . Stroke Mother   . Heart attack Mother   . Atrial fibrillation Mother   . Heart failure Mother   . Stroke Father   . Heart failure Father   . Hyperlipidemia Brother   . Hypertension Brother   . Heart disease Maternal Grandfather   . Heart disease Paternal Grandmother   . Heart disease Paternal Grandfather   . Hyperlipidemia Sister   . Hyperlipidemia Brother   . Hypertension Brother     Past Surgical History:  Procedure Laterality Date  . ADENOIDECTOMY    . TUBAL LIGATION     Social History   Occupational History  . retired-- IT trainer    Social History Main Topics  . Smoking status: Never Smoker  . Smokeless tobacco: Never Used  . Alcohol use No  . Drug use: No  . Sexual activity: Not Currently    Partners: Male     Comment: pt actually said whether she is sexually active or not is none of the govt business

## 2016-12-24 DIAGNOSIS — F419 Anxiety disorder, unspecified: Secondary | ICD-10-CM | POA: Diagnosis not present

## 2016-12-24 DIAGNOSIS — Z6824 Body mass index (BMI) 24.0-24.9, adult: Secondary | ICD-10-CM | POA: Diagnosis not present

## 2016-12-24 DIAGNOSIS — F5081 Binge eating disorder: Secondary | ICD-10-CM | POA: Diagnosis not present

## 2016-12-24 DIAGNOSIS — Z713 Dietary counseling and surveillance: Secondary | ICD-10-CM | POA: Diagnosis not present

## 2016-12-24 DIAGNOSIS — R635 Abnormal weight gain: Secondary | ICD-10-CM | POA: Diagnosis not present

## 2016-12-28 DIAGNOSIS — R030 Elevated blood-pressure reading, without diagnosis of hypertension: Secondary | ICD-10-CM | POA: Diagnosis not present

## 2016-12-28 DIAGNOSIS — M4722 Other spondylosis with radiculopathy, cervical region: Secondary | ICD-10-CM | POA: Diagnosis not present

## 2017-01-04 ENCOUNTER — Other Ambulatory Visit: Payer: Self-pay | Admitting: Internal Medicine

## 2017-01-06 DIAGNOSIS — R635 Abnormal weight gain: Secondary | ICD-10-CM | POA: Diagnosis not present

## 2017-01-07 ENCOUNTER — Other Ambulatory Visit: Payer: Self-pay | Admitting: Endocrinology

## 2017-01-07 DIAGNOSIS — M810 Age-related osteoporosis without current pathological fracture: Secondary | ICD-10-CM | POA: Diagnosis not present

## 2017-01-07 DIAGNOSIS — F5104 Psychophysiologic insomnia: Secondary | ICD-10-CM | POA: Diagnosis not present

## 2017-01-07 DIAGNOSIS — K635 Polyp of colon: Secondary | ICD-10-CM | POA: Diagnosis not present

## 2017-01-07 DIAGNOSIS — Z1389 Encounter for screening for other disorder: Secondary | ICD-10-CM | POA: Diagnosis not present

## 2017-01-07 DIAGNOSIS — E784 Other hyperlipidemia: Secondary | ICD-10-CM | POA: Diagnosis not present

## 2017-01-07 DIAGNOSIS — M5481 Occipital neuralgia: Secondary | ICD-10-CM | POA: Diagnosis not present

## 2017-01-07 DIAGNOSIS — Z1231 Encounter for screening mammogram for malignant neoplasm of breast: Secondary | ICD-10-CM

## 2017-01-07 DIAGNOSIS — E559 Vitamin D deficiency, unspecified: Secondary | ICD-10-CM | POA: Diagnosis not present

## 2017-01-07 DIAGNOSIS — Z6824 Body mass index (BMI) 24.0-24.9, adult: Secondary | ICD-10-CM | POA: Diagnosis not present

## 2017-01-07 DIAGNOSIS — G43909 Migraine, unspecified, not intractable, without status migrainosus: Secondary | ICD-10-CM | POA: Diagnosis not present

## 2017-01-11 DIAGNOSIS — E784 Other hyperlipidemia: Secondary | ICD-10-CM | POA: Diagnosis not present

## 2017-01-11 DIAGNOSIS — Z713 Dietary counseling and surveillance: Secondary | ICD-10-CM | POA: Diagnosis not present

## 2017-01-11 DIAGNOSIS — E785 Hyperlipidemia, unspecified: Secondary | ICD-10-CM | POA: Diagnosis not present

## 2017-01-20 ENCOUNTER — Ambulatory Visit
Admission: RE | Admit: 2017-01-20 | Discharge: 2017-01-20 | Disposition: A | Discharge status: Home / Self Care | Payer: Medicare Other | Source: Ambulatory Visit | Attending: Endocrinology | Admitting: Endocrinology

## 2017-01-20 DIAGNOSIS — Z1231 Encounter for screening mammogram for malignant neoplasm of breast: Secondary | ICD-10-CM | POA: Diagnosis not present

## 2017-01-21 ENCOUNTER — Other Ambulatory Visit: Payer: Self-pay | Admitting: Endocrinology

## 2017-01-21 DIAGNOSIS — Z6824 Body mass index (BMI) 24.0-24.9, adult: Secondary | ICD-10-CM | POA: Diagnosis not present

## 2017-01-21 DIAGNOSIS — R928 Other abnormal and inconclusive findings on diagnostic imaging of breast: Secondary | ICD-10-CM

## 2017-01-21 DIAGNOSIS — R635 Abnormal weight gain: Secondary | ICD-10-CM | POA: Diagnosis not present

## 2017-01-21 DIAGNOSIS — F5081 Binge eating disorder: Secondary | ICD-10-CM | POA: Diagnosis not present

## 2017-01-21 DIAGNOSIS — Z713 Dietary counseling and surveillance: Secondary | ICD-10-CM | POA: Diagnosis not present

## 2017-01-27 ENCOUNTER — Ambulatory Visit
Admission: RE | Admit: 2017-01-27 | Discharge: 2017-01-27 | Disposition: A | Discharge status: Home / Self Care | Payer: Medicare Other | Source: Ambulatory Visit | Attending: Endocrinology | Admitting: Endocrinology

## 2017-01-27 ENCOUNTER — Ambulatory Visit: Payer: Medicare Other

## 2017-01-27 DIAGNOSIS — R928 Other abnormal and inconclusive findings on diagnostic imaging of breast: Secondary | ICD-10-CM

## 2017-03-02 DIAGNOSIS — M4722 Other spondylosis with radiculopathy, cervical region: Secondary | ICD-10-CM | POA: Diagnosis not present

## 2017-03-03 ENCOUNTER — Other Ambulatory Visit: Payer: Self-pay | Admitting: Internal Medicine

## 2017-03-03 ENCOUNTER — Other Ambulatory Visit: Payer: Self-pay | Admitting: Neurological Surgery

## 2017-03-03 DIAGNOSIS — M4722 Other spondylosis with radiculopathy, cervical region: Secondary | ICD-10-CM

## 2017-03-03 NOTE — Telephone Encounter (Deleted)
Unable to access Controlled substance database, please advise

## 2017-03-11 DIAGNOSIS — R51 Headache: Secondary | ICD-10-CM | POA: Diagnosis not present

## 2017-03-11 DIAGNOSIS — G43719 Chronic migraine without aura, intractable, without status migrainosus: Secondary | ICD-10-CM | POA: Diagnosis not present

## 2017-03-11 DIAGNOSIS — Z79899 Other long term (current) drug therapy: Secondary | ICD-10-CM | POA: Diagnosis not present

## 2017-03-11 DIAGNOSIS — Z049 Encounter for examination and observation for unspecified reason: Secondary | ICD-10-CM | POA: Diagnosis not present

## 2017-03-14 ENCOUNTER — Ambulatory Visit
Admission: RE | Admit: 2017-03-14 | Discharge: 2017-03-14 | Disposition: A | Discharge status: Home / Self Care | Payer: Medicare Other | Source: Ambulatory Visit | Attending: Neurological Surgery | Admitting: Neurological Surgery

## 2017-03-14 DIAGNOSIS — M4722 Other spondylosis with radiculopathy, cervical region: Secondary | ICD-10-CM

## 2017-03-14 DIAGNOSIS — M4802 Spinal stenosis, cervical region: Secondary | ICD-10-CM | POA: Diagnosis not present

## 2017-03-15 HISTORY — PX: ANTERIOR CERVICAL DECOMP/DISCECTOMY FUSION: SHX1161

## 2017-03-17 DIAGNOSIS — R030 Elevated blood-pressure reading, without diagnosis of hypertension: Secondary | ICD-10-CM | POA: Diagnosis not present

## 2017-03-17 DIAGNOSIS — M4722 Other spondylosis with radiculopathy, cervical region: Secondary | ICD-10-CM | POA: Diagnosis not present

## 2017-04-02 DIAGNOSIS — M50122 Cervical disc disorder at C5-C6 level with radiculopathy: Secondary | ICD-10-CM | POA: Diagnosis not present

## 2017-04-02 DIAGNOSIS — M5412 Radiculopathy, cervical region: Secondary | ICD-10-CM | POA: Diagnosis not present

## 2017-04-02 DIAGNOSIS — M47892 Other spondylosis, cervical region: Secondary | ICD-10-CM | POA: Diagnosis not present

## 2017-04-21 DIAGNOSIS — M4722 Other spondylosis with radiculopathy, cervical region: Secondary | ICD-10-CM | POA: Diagnosis not present

## 2017-04-21 DIAGNOSIS — M47812 Spondylosis without myelopathy or radiculopathy, cervical region: Secondary | ICD-10-CM | POA: Diagnosis not present

## 2017-04-21 DIAGNOSIS — Z23 Encounter for immunization: Secondary | ICD-10-CM | POA: Diagnosis not present

## 2017-04-22 DIAGNOSIS — E663 Overweight: Secondary | ICD-10-CM | POA: Diagnosis not present

## 2017-04-22 DIAGNOSIS — F509 Eating disorder, unspecified: Secondary | ICD-10-CM | POA: Diagnosis not present

## 2017-04-22 DIAGNOSIS — Z6825 Body mass index (BMI) 25.0-25.9, adult: Secondary | ICD-10-CM | POA: Diagnosis not present

## 2017-05-10 DIAGNOSIS — Z6825 Body mass index (BMI) 25.0-25.9, adult: Secondary | ICD-10-CM | POA: Diagnosis not present

## 2017-05-10 DIAGNOSIS — E663 Overweight: Secondary | ICD-10-CM | POA: Diagnosis not present

## 2017-05-10 DIAGNOSIS — Z713 Dietary counseling and surveillance: Secondary | ICD-10-CM | POA: Diagnosis not present

## 2017-05-26 DIAGNOSIS — L648 Other androgenic alopecia: Secondary | ICD-10-CM | POA: Diagnosis not present

## 2017-05-26 DIAGNOSIS — L218 Other seborrheic dermatitis: Secondary | ICD-10-CM | POA: Diagnosis not present

## 2017-06-17 DIAGNOSIS — S90211A Contusion of right great toe with damage to nail, initial encounter: Secondary | ICD-10-CM | POA: Diagnosis not present

## 2017-07-01 DIAGNOSIS — M810 Age-related osteoporosis without current pathological fracture: Secondary | ICD-10-CM | POA: Diagnosis not present

## 2017-07-01 DIAGNOSIS — E7849 Other hyperlipidemia: Secondary | ICD-10-CM | POA: Diagnosis not present

## 2017-07-01 DIAGNOSIS — R82998 Other abnormal findings in urine: Secondary | ICD-10-CM | POA: Diagnosis not present

## 2017-07-01 DIAGNOSIS — E559 Vitamin D deficiency, unspecified: Secondary | ICD-10-CM | POA: Diagnosis not present

## 2017-07-06 DIAGNOSIS — Z6825 Body mass index (BMI) 25.0-25.9, adult: Secondary | ICD-10-CM | POA: Diagnosis not present

## 2017-07-06 DIAGNOSIS — D1800 Hemangioma unspecified site: Secondary | ICD-10-CM | POA: Diagnosis not present

## 2017-07-06 DIAGNOSIS — K635 Polyp of colon: Secondary | ICD-10-CM | POA: Diagnosis not present

## 2017-07-06 DIAGNOSIS — G43909 Migraine, unspecified, not intractable, without status migrainosus: Secondary | ICD-10-CM | POA: Diagnosis not present

## 2017-07-06 DIAGNOSIS — Z0000 Encounter for general adult medical examination without abnormal findings: Secondary | ICD-10-CM | POA: Diagnosis not present

## 2017-07-06 DIAGNOSIS — E559 Vitamin D deficiency, unspecified: Secondary | ICD-10-CM | POA: Diagnosis not present

## 2017-07-06 DIAGNOSIS — M810 Age-related osteoporosis without current pathological fracture: Secondary | ICD-10-CM | POA: Diagnosis not present

## 2017-07-06 DIAGNOSIS — E663 Overweight: Secondary | ICD-10-CM | POA: Diagnosis not present

## 2017-07-06 DIAGNOSIS — E7849 Other hyperlipidemia: Secondary | ICD-10-CM | POA: Diagnosis not present

## 2017-07-06 DIAGNOSIS — Z6826 Body mass index (BMI) 26.0-26.9, adult: Secondary | ICD-10-CM | POA: Diagnosis not present

## 2017-07-06 DIAGNOSIS — R69 Illness, unspecified: Secondary | ICD-10-CM | POA: Diagnosis not present

## 2017-07-06 DIAGNOSIS — M5481 Occipital neuralgia: Secondary | ICD-10-CM | POA: Diagnosis not present

## 2017-07-06 DIAGNOSIS — Z713 Dietary counseling and surveillance: Secondary | ICD-10-CM | POA: Diagnosis not present

## 2017-08-05 DIAGNOSIS — R51 Headache: Secondary | ICD-10-CM | POA: Diagnosis not present

## 2017-08-09 DIAGNOSIS — H2513 Age-related nuclear cataract, bilateral: Secondary | ICD-10-CM | POA: Diagnosis not present

## 2017-08-09 DIAGNOSIS — H5203 Hypermetropia, bilateral: Secondary | ICD-10-CM | POA: Diagnosis not present

## 2017-08-09 DIAGNOSIS — H25041 Posterior subcapsular polar age-related cataract, right eye: Secondary | ICD-10-CM | POA: Diagnosis not present

## 2017-09-14 DIAGNOSIS — R69 Illness, unspecified: Secondary | ICD-10-CM | POA: Diagnosis not present

## 2017-09-15 DIAGNOSIS — E663 Overweight: Secondary | ICD-10-CM | POA: Diagnosis not present

## 2017-09-15 DIAGNOSIS — Z6826 Body mass index (BMI) 26.0-26.9, adult: Secondary | ICD-10-CM | POA: Diagnosis not present

## 2017-09-15 DIAGNOSIS — Z713 Dietary counseling and surveillance: Secondary | ICD-10-CM | POA: Diagnosis not present

## 2017-09-22 IMAGING — MR MR BRAIN/IAC WO/W
12 of 13 series · 42 of 48 positions shown · IV contrast (multihance)
Comparison: CT head 06/06/2013

CLINICAL DATA: Non intractable headache. Dizziness and balance
problems.

EXAM:
MR BRAIN/IAC WITHOUT AND WITH CONTRAST
TECHNIQUE: Multiplanar, multisequence MR imaging was performed both before and
after administration of intravenous contrast.
CONTRAST:  14mL MULTIHANCE GADOBENATE DIMEGLUMINE 529 MG/ML IV SOLN

[Series 2: T1 · sagittal · 5.0mm · 0.45mm/px · 3 of 23 slices shown (1 of 3)]
[im 1/23]
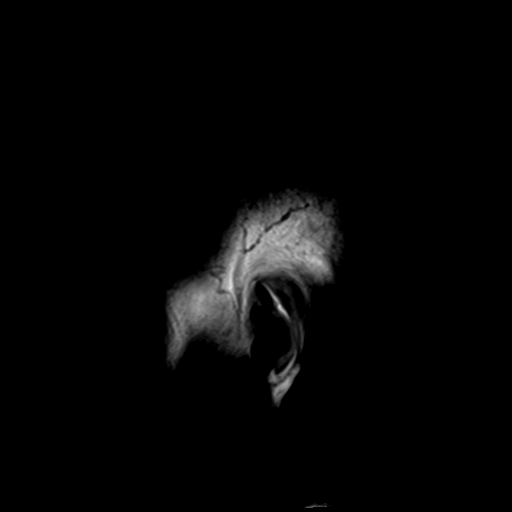
[im 12/23]
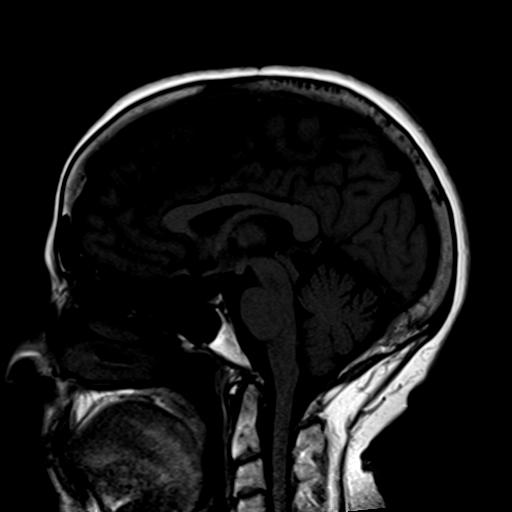
[im 23/23]
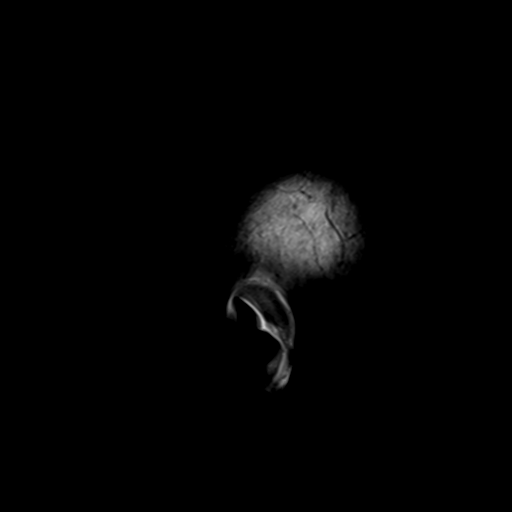

[Series 4: DWI · axial · 3.0mm · 1.20mm/px · z∈[-58,+104]mm · 7 of 55 slices shown (1 of 4)]
[im 1/55]
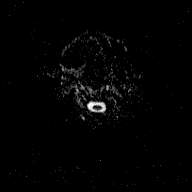
[im 10/55]
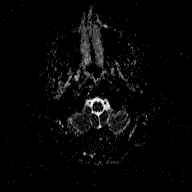
[im 19/55]
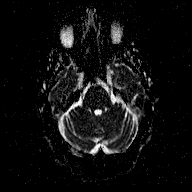
[im 28/55]
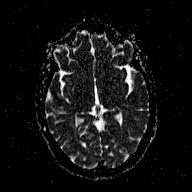
[im 37/55]
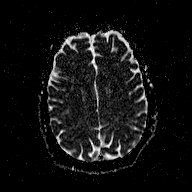
[im 46/55]
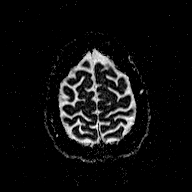
[im 55/55]
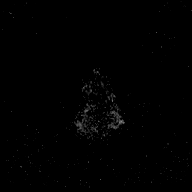

[Series 5: T2 · axial · 5.0mm · 0.72mm/px · z∈[-59,+104]mm · 3 of 26 slices shown]
[im 1/26]
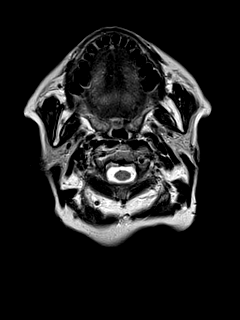
[im 13/26]
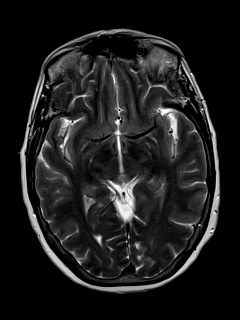
[im 26/26]
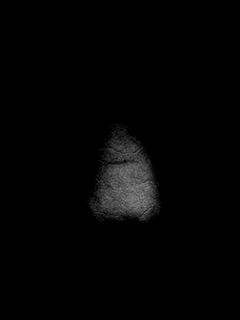

[Series 6: FLAIR · axial · 5.0mm · 0.45mm/px · z∈[-59,+104]mm · 3 of 26 slices shown]
[im 1/26]
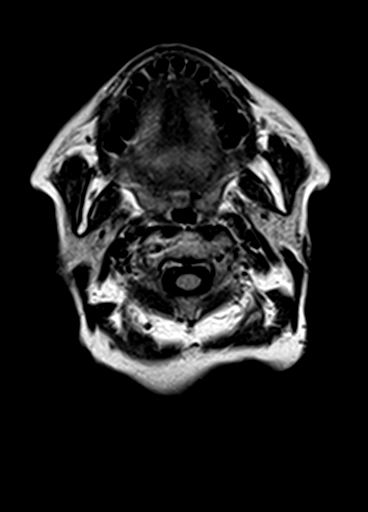
[im 13/26]
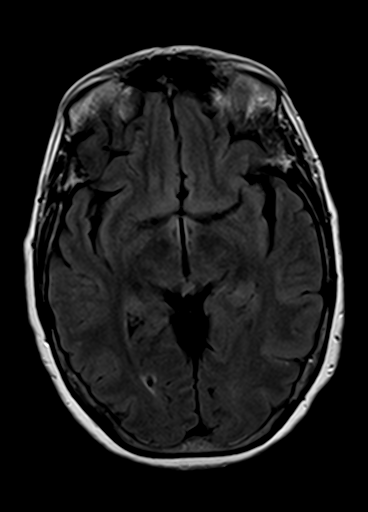
[im 26/26]
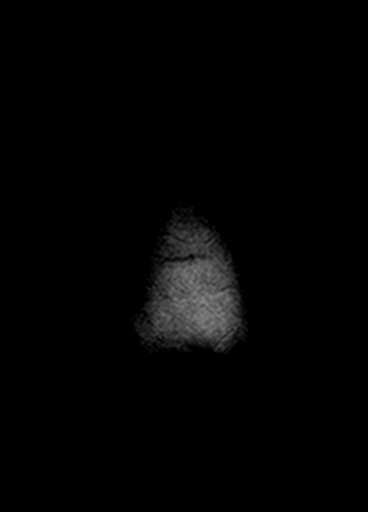

[Series 7: T1 · coronal · 3.0mm · 0.37mm/px · 1 of 11 slices shown (2 of 3)]
[im 1/11]
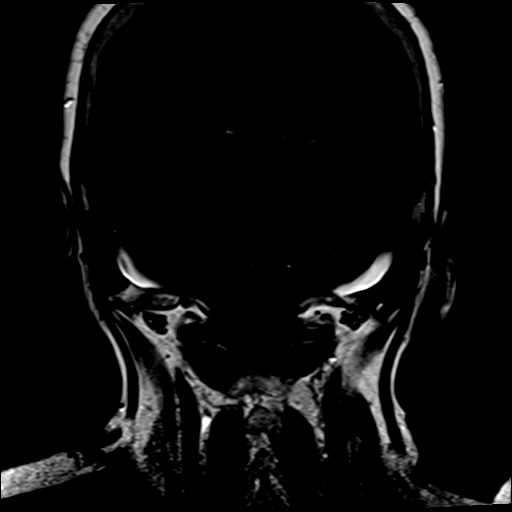

[Series 9: DWI · coronal · 3.0mm · 1.20mm/px · 5 of 48 slices shown (2 of 4)]
[im 1/48]
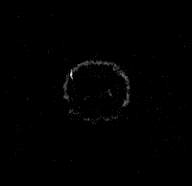
[im 12/48]
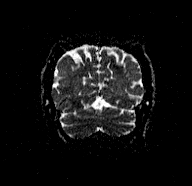
[im 24/48]
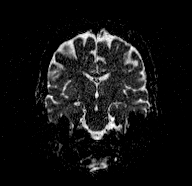
[im 36/48]
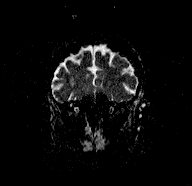
[im 48/48]
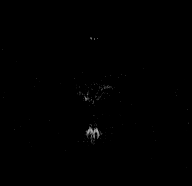

[Series 11: T1 · axial · 3.0mm · 0.37mm/px · 1 of 11 slices shown (3 of 3)]
[im 1/11]
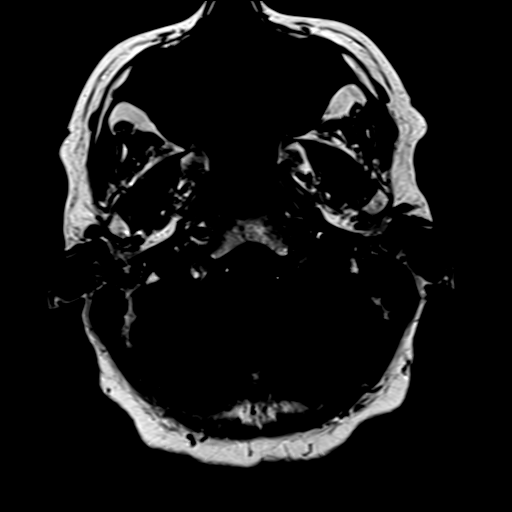

[Series 12: T1 post-contrast · axial · 3.0mm · 0.37mm/px · 1 of 11 slices shown (1 of 3)]
[im 1/11]
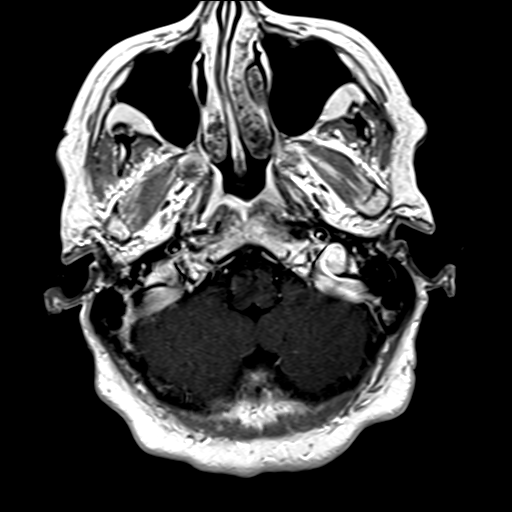

[Series 13: T1 post-contrast · coronal · 3.0mm · 0.37mm/px · 1 of 11 slices shown (2 of 3)]
[im 1/11]
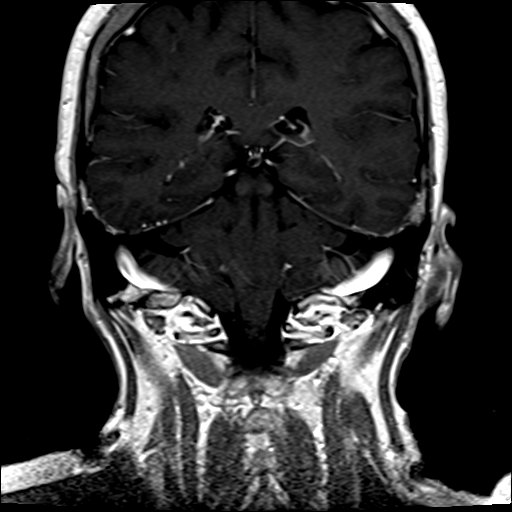

[Series 14: T1 post-contrast · axial · 3.0mm · 1.00mm/px · z∈[-66,+111]mm · 6 of 60 slices shown (3 of 3)]
[im 1/60]
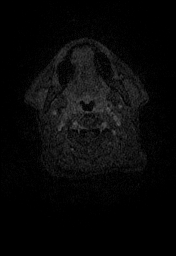
[im 12/60]
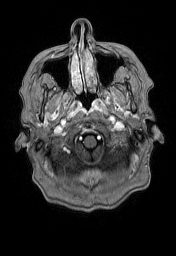
[im 24/60]
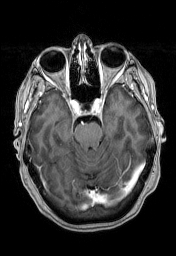
[im 36/60]
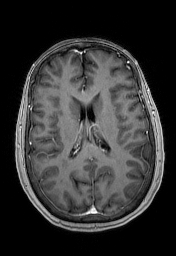
[im 48/60]
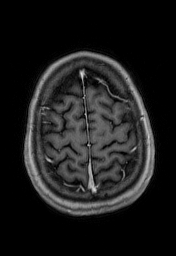
[im 60/60]
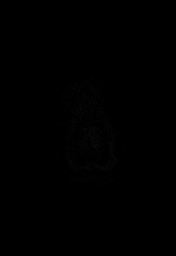

[Series 100: DWI · axial · 3.0mm · 1.20mm/px · z∈[-58,+104]mm · 6 of 55 slices shown (3 of 4)]
[im 1/55]
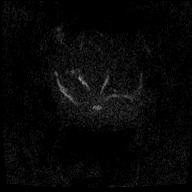
[im 11/55]
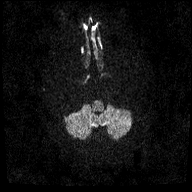
[im 22/55]
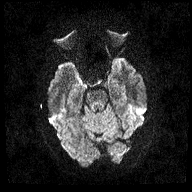
[im 33/55]
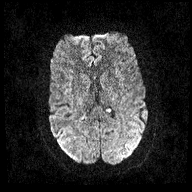
[im 44/55]
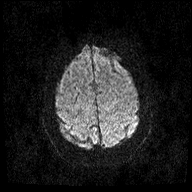
[im 55/55]
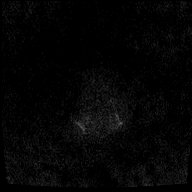

[Series 101: DWI · coronal · 3.0mm · 1.20mm/px · 5 of 48 slices shown (4 of 4)]
[im 1/48]
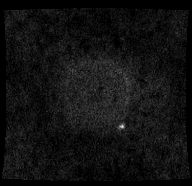
[im 12/48]
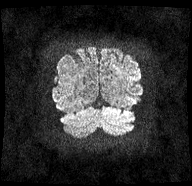
[im 24/48]
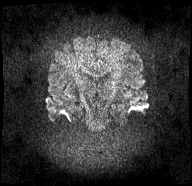
[im 36/48]
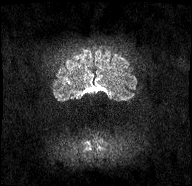
[im 48/48]
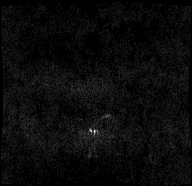

[42 of 48 positions shown; findings below may reference images not displayed]

FINDINGS: Ventricle size is normal.  Cerebral volume is normal.

Scattered small hyperintensities in the subcortical and deep white
matter bilaterally most consistent with chronic microvascular
ischemia. Brainstem and cerebellum normal.

Negative for acute infarct.

High-resolution imaging through the posterior fossa was performed.
Brainstem and cerebellum normal. No infarct or mass in the posterior
fossa. Negative for vestibular schwannoma. Mastoid sinus clear
bilaterally. No skull base lesion. Postcontrast imaging of the
posterior fossa reveals normal enhancement. Normal sigmoid sinus and
jugular vein bilaterally. Central skull base is normal. Pituitary
and cavernous sinus appears normal

Postcontrast imaging of the entire brain reveals normal enhancement.
IMPRESSION: No acute abnormality

Scattered white matter hyperintensities most consistent with chronic
microvascular ischemia.

## 2017-10-05 DIAGNOSIS — Z6826 Body mass index (BMI) 26.0-26.9, adult: Secondary | ICD-10-CM | POA: Diagnosis not present

## 2017-10-05 DIAGNOSIS — E663 Overweight: Secondary | ICD-10-CM | POA: Diagnosis not present

## 2017-10-05 DIAGNOSIS — R69 Illness, unspecified: Secondary | ICD-10-CM | POA: Diagnosis not present

## 2017-10-06 DIAGNOSIS — M5481 Occipital neuralgia: Secondary | ICD-10-CM | POA: Diagnosis not present

## 2017-10-06 DIAGNOSIS — R51 Headache: Secondary | ICD-10-CM | POA: Diagnosis not present

## 2017-10-06 DIAGNOSIS — Z1389 Encounter for screening for other disorder: Secondary | ICD-10-CM | POA: Diagnosis not present

## 2017-11-20 DIAGNOSIS — R21 Rash and other nonspecific skin eruption: Secondary | ICD-10-CM | POA: Diagnosis not present

## 2017-12-02 DIAGNOSIS — L218 Other seborrheic dermatitis: Secondary | ICD-10-CM | POA: Diagnosis not present

## 2017-12-29 ENCOUNTER — Ambulatory Visit (INDEPENDENT_AMBULATORY_CARE_PROVIDER_SITE_OTHER): Payer: Medicare HMO

## 2017-12-29 ENCOUNTER — Ambulatory Visit (INDEPENDENT_AMBULATORY_CARE_PROVIDER_SITE_OTHER): Payer: Medicare HMO | Admitting: Orthopedic Surgery

## 2017-12-29 DIAGNOSIS — M79641 Pain in right hand: Secondary | ICD-10-CM

## 2017-12-29 DIAGNOSIS — M65341 Trigger finger, right ring finger: Secondary | ICD-10-CM

## 2018-01-02 ENCOUNTER — Encounter (INDEPENDENT_AMBULATORY_CARE_PROVIDER_SITE_OTHER): Payer: Self-pay | Admitting: Orthopedic Surgery

## 2018-01-02 DIAGNOSIS — M65341 Trigger finger, right ring finger: Secondary | ICD-10-CM | POA: Diagnosis not present

## 2018-01-02 DIAGNOSIS — M79641 Pain in right hand: Secondary | ICD-10-CM

## 2018-01-02 MED ORDER — METHYLPREDNISOLONE ACETATE 40 MG/ML IJ SUSP
13.3300 mg | INTRAMUSCULAR | Status: AC | PRN
  Administered 2018-01-02: 13.33 mg

## 2018-01-02 MED ORDER — LIDOCAINE HCL 1 % IJ SOLN
3.0000 mL | INTRAMUSCULAR | Status: AC | PRN
  Administered 2018-01-02: 3 mL

## 2018-01-02 MED ORDER — BUPIVACAINE HCL 0.25 % IJ SOLN
0.3300 mL | INTRAMUSCULAR | Status: AC | PRN
  Administered 2018-01-02: .33 mL

## 2018-01-02 NOTE — Progress Notes (Addendum)
Office Visit Note   Patient: Kylie Wheeler           Date of Birth: 04/09/1946           MRN: [id number] Visit Date: 12/29/2017 Requested by: Kylie Bowen, MD [place], [place] [address] PCP: Kylie Bowen, MD  Subjective: Chief Complaint  Patient presents with  . Right Hand - Pain    HPI: Kylie Wheeler is a patient with right hand pain.  She reports pain at the [facility] joint of the thumb as well as triggering of that right fourth finger.  This is been going on for 3 months.  Does happen on a daily basis.  It is aggravating but not significantly annoying for the patient yet.              ROS: All systems reviewed are negative as they relate to the chief complaint within the history of present illness.  Patient denies  fevers or chills.   Assessment & Plan: Visit Diagnoses:  1. Pain in right hand   2. 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  Plan is ultrasound injection into that tendon sheath around the A1 pulley.  If her symptoms recur then she would be a candidate for trigger finger release of the A1 pulley.  CMC arthritis could be injected.  Follow-up as needed  Follow-Up Instructions: Return if symptoms worsen or fail to improve.   Orders:  Orders Placed This Encounter  Procedures  . XR Hand Complete Right   No orders of the defined types were placed in this encounter.     Procedures: Hand/UE Inj: R ring A1 for trigger finger on 01/02/2018 10:14 AM Indications: therapeutic Details: 25 G needle, volar approach Medications: 0.33 mL bupivacaine 0.25 %; 13.33 mg methylPREDNISolone acetate 40 MG/ML; 3 mL lidocaine 1 % Outcome: tolerated well, no immediate complications Procedure, treatment alternatives, risks and benefits explained, specific risks discussed. Consent was given by the patient. Immediately prior to procedure a time out was called to verify the correct patient, procedure,  equipment, support staff and site/side marked as required. Patient was prepped and draped in the usual sterile fashion.       Clinical Data: No additional findings.  Objective: Vital Signs: There were no vitals taken for this visit.  Physical Exam:   Constitutional: Patient appears well-developed HEENT:  Head: Normocephalic Eyes:EOM are normal Neck: Normal range of motion Cardiovascular: Normal rate Pulmonary/chest: Effort normal Neurologic: Patient is alert Skin: Skin is warm Psychiatric: Patient has normal mood and affect    Ortho Exam: Orthopedic exam demonstrates triggering of that fourth finger.  CMC grind test positive for the thumb.  Motor or sensory function in all fingers intact.  Radial pulse intact.  Tenderness to palpation at the ring finger A1 pulley.  Specialty Comments:  No specialty comments available.  Imaging: No results found.   PMFS History: Patient Active Problem List   Diagnosis Date Noted  . Anxiety 12/14/2016  . Lesion of vertebra, T1 09/14/2016  . Cervicogenic headache 03/30/2016  . Nonallopathic lesion of cervical region 03/30/2016  . Nonallopathic lesion of thoracic region 03/30/2016  . Nonallopathic lesion of lumbosacral region 03/30/2016  . Allergic reaction 03/14/2014  . Syncope 11/29/2012  . Vitamin D deficiency 08/14/2010  . OSTEOPOROSIS 08/14/2010  . COLONIC POLYPS, HX OF 08/14/2010  . VERTIGO 08/22/2007  . HYPERLIPIDEMIA 01/12/2007  . INSOMNIA, CHRONIC 01/12/2007  .  Headache 01/12/2007   Past Medical History:  Diagnosis Date  . Bladder problem   . Hyperlipidemia   . Insomnia   . Migraines   . Osteoporosis   . Vitamin D deficiency     Family History  Problem Relation Age of Onset  . Stroke Mother   . Heart attack Mother   . Atrial fibrillation Mother   . Heart failure Mother   . Stroke Father   . Heart failure Father   . Hyperlipidemia Brother   . Hypertension Brother   . Heart disease Maternal Grandfather   .  Heart disease Paternal Grandmother   . Heart disease Paternal Grandfather   . Hyperlipidemia Sister   . Hyperlipidemia Brother   . Hypertension Brother     Past Surgical History:  Procedure Laterality Date  . ADENOIDECTOMY    . TUBAL LIGATION     Social History   Occupational History  . Occupation: retired-- IT trainer  Tobacco Use  . Smoking status: Never Smoker  . Smokeless tobacco: Never Used  Substance and Sexual Activity  . Alcohol use: No  . Drug use: No  . Sexual activity: Not Currently    Partners: Male    Comment: pt actually said whether she is sexually active or not is none of the govt business

## 2018-01-04 DIAGNOSIS — M5481 Occipital neuralgia: Secondary | ICD-10-CM | POA: Diagnosis not present

## 2018-01-04 DIAGNOSIS — G43909 Migraine, unspecified, not intractable, without status migrainosus: Secondary | ICD-10-CM | POA: Diagnosis not present

## 2018-01-13 DIAGNOSIS — M5481 Occipital neuralgia: Secondary | ICD-10-CM | POA: Diagnosis not present

## 2018-01-14 DIAGNOSIS — Z6826 Body mass index (BMI) 26.0-26.9, adult: Secondary | ICD-10-CM | POA: Diagnosis not present

## 2018-01-14 DIAGNOSIS — E559 Vitamin D deficiency, unspecified: Secondary | ICD-10-CM | POA: Diagnosis not present

## 2018-01-14 DIAGNOSIS — R51 Headache: Secondary | ICD-10-CM | POA: Diagnosis not present

## 2018-01-14 DIAGNOSIS — M810 Age-related osteoporosis without current pathological fracture: Secondary | ICD-10-CM | POA: Diagnosis not present

## 2018-01-14 DIAGNOSIS — G4700 Insomnia, unspecified: Secondary | ICD-10-CM | POA: Diagnosis not present

## 2018-01-14 DIAGNOSIS — E7849 Other hyperlipidemia: Secondary | ICD-10-CM | POA: Diagnosis not present

## 2018-01-14 DIAGNOSIS — R69 Illness, unspecified: Secondary | ICD-10-CM | POA: Diagnosis not present

## 2018-01-14 DIAGNOSIS — K635 Polyp of colon: Secondary | ICD-10-CM | POA: Diagnosis not present

## 2018-01-18 ENCOUNTER — Other Ambulatory Visit: Payer: Self-pay | Admitting: Endocrinology

## 2018-01-18 DIAGNOSIS — M810 Age-related osteoporosis without current pathological fracture: Secondary | ICD-10-CM

## 2018-01-18 DIAGNOSIS — Z1231 Encounter for screening mammogram for malignant neoplasm of breast: Secondary | ICD-10-CM

## 2018-01-27 DIAGNOSIS — H04123 Dry eye syndrome of bilateral lacrimal glands: Secondary | ICD-10-CM | POA: Diagnosis not present

## 2018-02-02 DIAGNOSIS — R51 Headache: Secondary | ICD-10-CM | POA: Diagnosis not present

## 2018-02-02 DIAGNOSIS — M5481 Occipital neuralgia: Secondary | ICD-10-CM | POA: Diagnosis not present

## 2018-02-15 DIAGNOSIS — M5481 Occipital neuralgia: Secondary | ICD-10-CM | POA: Diagnosis not present

## 2018-02-15 DIAGNOSIS — Z6824 Body mass index (BMI) 24.0-24.9, adult: Secondary | ICD-10-CM | POA: Diagnosis not present

## 2018-02-15 DIAGNOSIS — R69 Illness, unspecified: Secondary | ICD-10-CM | POA: Diagnosis not present

## 2018-02-16 ENCOUNTER — Encounter: Payer: Self-pay | Admitting: Podiatry

## 2018-02-16 ENCOUNTER — Ambulatory Visit: Payer: Medicare HMO | Admitting: Podiatry

## 2018-02-16 VITALS — BP 134/76 | HR 68 | Resp 16

## 2018-02-16 DIAGNOSIS — B351 Tinea unguium: Secondary | ICD-10-CM

## 2018-02-16 DIAGNOSIS — M2041 Other hammer toe(s) (acquired), right foot: Secondary | ICD-10-CM

## 2018-02-16 DIAGNOSIS — M21619 Bunion of unspecified foot: Secondary | ICD-10-CM | POA: Diagnosis not present

## 2018-02-16 DIAGNOSIS — L600 Ingrowing nail: Secondary | ICD-10-CM

## 2018-02-16 DIAGNOSIS — M2042 Other hammer toe(s) (acquired), left foot: Secondary | ICD-10-CM | POA: Diagnosis not present

## 2018-02-16 DIAGNOSIS — M7981 Nontraumatic hematoma of soft tissue: Secondary | ICD-10-CM | POA: Diagnosis not present

## 2018-02-16 DIAGNOSIS — L608 Other nail disorders: Secondary | ICD-10-CM | POA: Diagnosis not present

## 2018-02-16 NOTE — Progress Notes (Signed)
   Subjective:    Patient ID: Kylie Wheeler, female    DOB: 29-Nov-1945, 72 y.o.   MRN: [id number]  HPI    Review of Systems  All other systems reviewed and are negative.      Objective:   Physical Exam        Assessment & Plan:

## 2018-02-16 NOTE — Progress Notes (Signed)
Subjective:   Patient ID: Kylie Wheeler, female   DOB: 72 y.o.   MRN: [id number]   HPI Patient states that she has a damaged left over right hallux nail that is been thickened and sore and makes it hard to wear shoe gear and is been present for over a year with dark discoloration has not improved over this year.  Patient states that she did have an injury that she remembers back that and patient does not smoke and likes to be active   Review of Systems  All other systems reviewed and are negative.       Objective:  Physical Exam  Constitutional: She appears well-developed and well-nourished.  Cardiovascular: Intact distal pulses.  Pulmonary/Chest: Effort normal.  Musculoskeletal: Normal range of motion.  Neurological: She is alert.  Skin: Skin is warm.  Nursing note and vitals reviewed.   Neurovascular status intact muscle strength is adequate range of motion within normal limits with patient found to have a severe dystrophic hallux nail left that is very painful when pressed make shoe gear difficult and is loose upon testing.  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  Explained procedure and risk and patient wants surgery due to pain and today I infiltrated the left hallux 60 mill grams like Marcaine mixture sterile prep applied to the toe and the toenail was removed with sterile technique the matrix was exposed and phenol 5 applications 30 alcohol lavage and sterile dressing administered.  Patient was instructed on soaks reappoint and was encouraged to call with any questions concerns

## 2018-02-16 NOTE — Patient Instructions (Signed)

## 2018-02-22 ENCOUNTER — Telehealth: Payer: Self-pay | Admitting: Podiatry

## 2018-02-22 NOTE — Telephone Encounter (Signed)
Pt called to discuss the pain in her toe after the toenail procedure. I asked pt if the toe had been without pain, then became painful. Pt states yes. I told her I felt she would benefit from letting the clinical nurse take a look, to continue the soaks and antibiotic ointment until seen in office. I transferred pt to schedulers.

## 2018-02-22 NOTE — Telephone Encounter (Signed)
I had a procedure done on my big toe last week. I'm having trouble with the corner of it. It feels like what an ingrown toenail would feel like. Its extremely sensitive to the touch and I'm not sure what I'm supposed to do about it. I have been soaking it as instructed. My telephone number is [phone]. Thanks so much.

## 2018-02-22 NOTE — Telephone Encounter (Signed)
Left message informing pt it was not unusual to have discomfort at this point in the recovery, to continue the epsom salt soaks and antibiotic dressings at least for the next 2-4 weeks to keep the scab soft, and to call again with any concerns.

## 2018-02-23 ENCOUNTER — Ambulatory Visit (INDEPENDENT_AMBULATORY_CARE_PROVIDER_SITE_OTHER): Payer: Self-pay | Admitting: Podiatry

## 2018-02-23 DIAGNOSIS — L600 Ingrowing nail: Secondary | ICD-10-CM

## 2018-02-23 NOTE — Progress Notes (Signed)
Patient is here today with concern about her toenail.  She states it is very sore and tender.  Recent procedure performed removal of right hallux nail on 02/16/2018.  She states that she soaks it twice a day and Epson salts applies Neosporin and a bandage.  Overall area appears to be healing well, there is some mild redness around the nailbed, this looks more like tissue irritation from Epson salt soaks and chemical applied during procedure.  There is some serosanguineous drainage, no erythema at this time.  Discussed signs and symptoms of infection, and importance of reporting symptoms if they do not improve or worsen.  Discussed switching to antibacterial soap soaks and continuing to apply Neosporin and bandage daily.  Verbal and written instructions were dispensed.  She is to follow-up with any acute symptom changes or if symptoms fail to improve.  Pictures of the toe were obtained and discussed with Dr. Paulla Dolly during this visit.

## 2018-02-23 NOTE — Patient Instructions (Signed)

## 2018-03-01 ENCOUNTER — Telehealth: Payer: Self-pay | Admitting: Podiatry

## 2018-03-01 MED ORDER — AMOXICILLIN-POT CLAVULANATE 875-125 MG PO TABS: 1.0000 | ORAL_TABLET | Freq: Two times a day (BID) | ORAL | 0 refills | Status: DC

## 2018-03-01 NOTE — Telephone Encounter (Signed)
I informed pt Dr. Cannon Kettle had ordered an antibiotic and it had been sent to the [facility]. Pt asked if she should be eating yogurt. I told pt if she was susceptible to yeast infection to begin the yogurt now.

## 2018-03-01 NOTE — Addendum Note (Signed)
Addended by: Harriett Sine D on: 03/01/2018 02:12 PM   Modules accepted: Orders

## 2018-03-01 NOTE — Telephone Encounter (Signed)
We can give her Augmentin 875/125mg  x bid x 28 tabs if she has no allergies to PCN meanwhile -Dr. Cannon Kettle

## 2018-03-01 NOTE — Telephone Encounter (Signed)
I called earlier this morning and I got an appointment to see the nurse tomorrow for my left great toe. My foot is bothering me and I was wondering if you would be able to call me in an oral antibiotic. If you would call me back at [phone]. Thank you.

## 2018-03-01 NOTE — Telephone Encounter (Signed)
Unable to leave a message pt's phone rang for over 1 minute without answering service.

## 2018-03-01 NOTE — Telephone Encounter (Signed)
I asked pt to describe her toe and she states it is red 2/3 down and there are blisters around the nail area. I asked pt if she is rinsing the toe after soaking in the antibacterial soap solution suggested on 02/23/2018 and to switch to Bacitracin ointment instead of neosporin until seen in office. Pt states she is out-of-town and will need an appt tomorrow. I transferred to scheduler.

## 2018-03-01 NOTE — Telephone Encounter (Signed)
I would like to speak to the nurse regarding my toe. I'm not sure what its supposed to be doing, but it still hurts. If the nurse could call me back at [phone]. Thanks.

## 2018-03-02 ENCOUNTER — Other Ambulatory Visit: Payer: Self-pay

## 2018-03-02 ENCOUNTER — Ambulatory Visit (INDEPENDENT_AMBULATORY_CARE_PROVIDER_SITE_OTHER): Payer: Self-pay

## 2018-03-02 DIAGNOSIS — L600 Ingrowing nail: Secondary | ICD-10-CM

## 2018-03-03 ENCOUNTER — Ambulatory Visit
Admission: RE | Admit: 2018-03-03 | Discharge: 2018-03-03 | Disposition: A | Discharge status: Home / Self Care | Payer: Medicare HMO | Source: Ambulatory Visit | Attending: Endocrinology | Admitting: Endocrinology

## 2018-03-03 ENCOUNTER — Ambulatory Visit
Admission: RE | Admit: 2018-03-03 | Discharge: 2018-03-03 | Disposition: A | Discharge status: Home / Self Care | Payer: Medicare Other | Source: Ambulatory Visit | Attending: Endocrinology | Admitting: Endocrinology

## 2018-03-03 DIAGNOSIS — Z1231 Encounter for screening mammogram for malignant neoplasm of breast: Secondary | ICD-10-CM

## 2018-03-03 DIAGNOSIS — Z780 Asymptomatic menopausal state: Secondary | ICD-10-CM | POA: Diagnosis not present

## 2018-03-03 DIAGNOSIS — M810 Age-related osteoporosis without current pathological fracture: Secondary | ICD-10-CM

## 2018-03-04 NOTE — Progress Notes (Signed)
Patient is here today for follow-up appointment.  Recent procedure performed right hallux nail removal, performed on 02/16/2018.  She says that she feels like the toe is getting worse, and the pain has increased since last week.  Patient is currently taking Augmentin 8 75-1 2 5  mg tablets 1 by mouth twice daily.  She has had 2 doses so far.  She does complain of recent bouts of nausea.  Vital signs are stable blood pressure 122/80, temperature 96.6.  The toe appears to be having a reaction to the phenol that was used for the permanent procedure.  Tissues appear to be inflamed, but not infected.  Dr. Amalia Hailey did come in to evaluate the patient and agreed that this was an inflammatory reaction to the phenol and not an infection.  She was advised to prophylactically continue antibiotic usage, she is to continue with antibacterial soap soaks, she is to bandage the area with Silvadene.  She was given supplies and a container of Silvadene.  Advised that if symptoms worsen or fail to improve in the next 2 days she is to follow-up with a phone call or visit into the office otherwise she is to follow-up in 1 to 2 weeks.

## 2018-03-08 ENCOUNTER — Telehealth: Payer: Self-pay | Admitting: Podiatry

## 2018-03-08 NOTE — Telephone Encounter (Signed)
This message is for Kylie Billow, RN where she called me. My foot doesn't look as bad although its really tender and sore to the touch. I'm stating to take the antibiotic now so I'm going to keep my appointment and I guess I'll see you next week. Call if you need to. Thanks.

## 2018-03-10 DIAGNOSIS — G8929 Other chronic pain: Secondary | ICD-10-CM | POA: Diagnosis not present

## 2018-03-10 DIAGNOSIS — R51 Headache: Secondary | ICD-10-CM | POA: Diagnosis not present

## 2018-03-10 DIAGNOSIS — M5481 Occipital neuralgia: Secondary | ICD-10-CM | POA: Diagnosis not present

## 2018-03-10 DIAGNOSIS — M47812 Spondylosis without myelopathy or radiculopathy, cervical region: Secondary | ICD-10-CM | POA: Diagnosis not present

## 2018-03-11 DIAGNOSIS — R1013 Epigastric pain: Secondary | ICD-10-CM | POA: Diagnosis not present

## 2018-03-15 ENCOUNTER — Ambulatory Visit: Payer: Medicare HMO

## 2018-03-15 DIAGNOSIS — M21619 Bunion of unspecified foot: Secondary | ICD-10-CM

## 2018-03-15 DIAGNOSIS — L600 Ingrowing nail: Secondary | ICD-10-CM

## 2018-03-16 ENCOUNTER — Other Ambulatory Visit: Payer: Self-pay

## 2018-03-16 NOTE — Progress Notes (Signed)
Patient is here today for follow-up appointment, procedure performed on 02/16/2018, hallux nail avulsion.  She states that the area is still sore and tender to the touch, but she does continue to soak and bandage the area.  Noted well-healing site, no redness, no erythema, serosanguineous drainage that appears to be drying on the nailbed.  Due to continued soreness the patient was seen by Dr. Cannon Kettle, nailbed was gently debrided.  Neosporin and bandage was applied.  Patient is to continue with soaking and bandaging as directed.  Patient is to follow-up in 2 weeks to recheck her nail.

## 2018-03-22 DIAGNOSIS — R69 Illness, unspecified: Secondary | ICD-10-CM | POA: Diagnosis not present

## 2018-03-30 ENCOUNTER — Ambulatory Visit: Payer: Medicare HMO | Admitting: Podiatry

## 2018-03-30 ENCOUNTER — Encounter: Payer: Self-pay | Admitting: Podiatry

## 2018-03-30 ENCOUNTER — Other Ambulatory Visit: Payer: Self-pay | Admitting: Podiatry

## 2018-03-30 ENCOUNTER — Ambulatory Visit (INDEPENDENT_AMBULATORY_CARE_PROVIDER_SITE_OTHER): Payer: Medicare HMO

## 2018-03-30 DIAGNOSIS — M2042 Other hammer toe(s) (acquired), left foot: Secondary | ICD-10-CM | POA: Diagnosis not present

## 2018-03-30 DIAGNOSIS — M21612 Bunion of left foot: Secondary | ICD-10-CM | POA: Diagnosis not present

## 2018-03-30 DIAGNOSIS — M79672 Pain in left foot: Secondary | ICD-10-CM

## 2018-03-30 DIAGNOSIS — R69 Illness, unspecified: Secondary | ICD-10-CM | POA: Diagnosis not present

## 2018-03-30 DIAGNOSIS — M21619 Bunion of unspecified foot: Secondary | ICD-10-CM

## 2018-03-30 DIAGNOSIS — L03032 Cellulitis of left toe: Secondary | ICD-10-CM | POA: Diagnosis not present

## 2018-03-30 DIAGNOSIS — M2041 Other hammer toe(s) (acquired), right foot: Secondary | ICD-10-CM

## 2018-03-30 MED ORDER — CEPHALEXIN 500 MG PO CAPS: 500.0000 mg | ORAL_CAPSULE | Freq: Two times a day (BID) | ORAL | 1 refills | Status: DC

## 2018-03-30 NOTE — Progress Notes (Signed)
Subjective:   Patient ID: Kylie Wheeler, female   DOB: 73 y.o.   MRN: [id number]   HPI Patient presents stating that she is concerned because she still has some drainage of her toe and she also states her bunion is bothering her more and she like to get it corrected at one point probably in January   ROS      Objective:  Physical Exam  Neurovascular status unchanged with patient's left hallux showing some redness on the lateral side is localized with crusted tissue and no proximal edema erythema drainage noted with moderate structural bunion deformity is red and painful on the medial side first metatarsal     Assessment:  Mild paronychia infection left hallux localized in nature with structural bunion deformity left over right     Plan:  H&P precautionary x-ray taken and I went ahead today and I placed on cephalexin 500 mg twice daily discussed bunion correction which I think should be done in future and I explained procedure and patient will have this done but is can hold off currently and have it done most likely in January and I did educate her on it today  X-ray indicated that there is no signs of ostial lysis of the left hallux and there is structural bunion deformity with elevation of the ankle left

## 2018-03-30 NOTE — Patient Instructions (Signed)

## 2018-04-01 DIAGNOSIS — G4440 Drug-induced headache, not elsewhere classified, not intractable: Secondary | ICD-10-CM | POA: Diagnosis not present

## 2018-04-01 DIAGNOSIS — T471X5A Adverse effect of other antacids and anti-gastric-secretion drugs, initial encounter: Secondary | ICD-10-CM | POA: Diagnosis not present

## 2018-04-01 DIAGNOSIS — R51 Headache: Secondary | ICD-10-CM | POA: Diagnosis not present

## 2018-04-01 DIAGNOSIS — R110 Nausea: Secondary | ICD-10-CM | POA: Diagnosis not present

## 2018-04-04 DIAGNOSIS — Z23 Encounter for immunization: Secondary | ICD-10-CM | POA: Diagnosis not present

## 2018-04-04 DIAGNOSIS — R51 Headache: Secondary | ICD-10-CM | POA: Diagnosis not present

## 2018-04-04 DIAGNOSIS — G4700 Insomnia, unspecified: Secondary | ICD-10-CM | POA: Diagnosis not present

## 2018-04-04 DIAGNOSIS — Z79899 Other long term (current) drug therapy: Secondary | ICD-10-CM | POA: Diagnosis not present

## 2018-04-18 ENCOUNTER — Ambulatory Visit (INDEPENDENT_AMBULATORY_CARE_PROVIDER_SITE_OTHER): Payer: Medicare HMO | Admitting: Orthopedic Surgery

## 2018-04-18 ENCOUNTER — Encounter (INDEPENDENT_AMBULATORY_CARE_PROVIDER_SITE_OTHER): Payer: Self-pay | Admitting: Orthopedic Surgery

## 2018-04-18 ENCOUNTER — Ambulatory Visit (INDEPENDENT_AMBULATORY_CARE_PROVIDER_SITE_OTHER): Payer: Medicare HMO

## 2018-04-18 DIAGNOSIS — M65341 Trigger finger, right ring finger: Secondary | ICD-10-CM | POA: Diagnosis not present

## 2018-04-18 DIAGNOSIS — M79672 Pain in left foot: Secondary | ICD-10-CM

## 2018-04-23 ENCOUNTER — Encounter (INDEPENDENT_AMBULATORY_CARE_PROVIDER_SITE_OTHER): Payer: Self-pay | Admitting: Orthopedic Surgery

## 2018-04-23 DIAGNOSIS — M65341 Trigger finger, right ring finger: Secondary | ICD-10-CM | POA: Diagnosis not present

## 2018-04-23 DIAGNOSIS — M79672 Pain in left foot: Secondary | ICD-10-CM | POA: Diagnosis not present

## 2018-04-23 MED ORDER — LIDOCAINE HCL 1 % IJ SOLN
3.0000 mL | INTRAMUSCULAR | Status: AC | PRN
  Administered 2018-04-23: 3 mL

## 2018-04-23 MED ORDER — BUPIVACAINE HCL 0.25 % IJ SOLN
0.3300 mL | INTRAMUSCULAR | Status: AC | PRN
  Administered 2018-04-23: .33 mL

## 2018-04-23 MED ORDER — METHYLPREDNISOLONE ACETATE 40 MG/ML IJ SUSP
13.3300 mg | INTRAMUSCULAR | Status: AC | PRN
  Administered 2018-04-23: 13.33 mg

## 2018-04-23 NOTE — Progress Notes (Addendum)
Office Visit Note   Patient: Kylie Wheeler           Date of Birth: 1945/06/19           MRN: [id number] Visit Date: 04/18/2018 Requested by: Reynold Bowen, MD [place], [place] [address] PCP: Reynold Bowen, MD  Subjective: Chief Complaint  Patient presents with  . Left Knee - Pain  . Right Hand - Pain  . Left Foot - Pain    HPI: Kylie Wheeler is a patient with mild left knee pain.  She has a known history of some mild left knee arthritis.  She states is better today.  She also reports right ring finger locking.  She had previous injection 12/29/2017.  She also has left foot bunion which is painful and also has deformity.  She likes to walk for exercise.  She denies much in the way of mechanical symptoms.              ROS: All systems reviewed are negative as they relate to the chief complaint within the history of present illness.  Patient denies  fevers or chills.   Assessment & Plan: Visit Diagnoses:  1. Pain in left foot   2. Trigger ring finger of right hand     Plan: Impression is left knee pain with fairly small Baker's cyst.  I do not think any intervention is required on that left knee today.  Right trigger finger ring is injected under ultrasound guidance today.  I would favor surgical release before any further injections.  Also patient does have left foot bunion.  We will refer her to Dr. due to for surgical treatment of that.  Follow-Up Instructions: No follow-ups on file.   Orders:  Orders Placed This Encounter  Procedures  . XR Foot 2 Views Left   No orders of the defined types were placed in this encounter.     Procedures: Hand/UE Inj: R ring A1 for trigger finger on 04/23/2018 7:40 PM Indications: therapeutic Details: 25 G needle, volar approach Medications: 0.33 mL bupivacaine 0.25 %; 13.33 mg methylPREDNISolone acetate 40 MG/ML; 3 mL lidocaine 1 % Outcome: tolerated well, no immediate complications Procedure, treatment alternatives, risks  and benefits explained, specific risks discussed. Consent was given by the patient. Immediately prior to procedure a time out was called to verify the correct patient, procedure, equipment, support staff and site/side marked as required. Patient was prepped and draped in the usual sterile fashion.       Clinical Data: No additional findings.  Objective: Vital Signs: There were no vitals taken for this visit.  Physical Exam:   Constitutional: Patient appears well-developed HEENT:  Head: Normocephalic Eyes:EOM are normal Neck: Normal range of motion Cardiovascular: Normal rate Pulmonary/chest: Effort normal Neurologic: Patient is alert Skin: Skin is warm Psychiatric: Patient has normal mood and affect    Ortho Exam: Ortho exam demonstrates on that left foot mild hallux valgus deformity but with good range of motion patient has palpable pedal pulses.  No lesser toe deformity present.  No pain with pronation supination of the forefoot.  Patient has tenderness over the right A1 pulley ring finger.  Triggering is present.  Left knee has trace effusion and small Baker's cyst but good range of motion stable collateral crucial ligaments  Specialty Comments:  No specialty comments available.  Imaging: No results found.   PMFS History: Patient Active Problem List   Diagnosis Date Noted  . Anxiety 12/14/2016  . Lesion of vertebra,  T1 09/14/2016  . Overweight 08/25/2016  . Chronic migraine without aura without status migrainosus, not intractable 05/28/2016  . Cervicogenic headache 03/30/2016  . Nonallopathic lesion of cervical region 03/30/2016  . Nonallopathic lesion of thoracic region 03/30/2016  . Nonallopathic lesion of lumbosacral region 03/30/2016  . Allergic reaction 03/14/2014  . Syncope 11/29/2012  . Vitamin D deficiency 08/14/2010  . OSTEOPOROSIS 08/14/2010  . COLONIC POLYPS, HX OF 08/14/2010  . VERTIGO 08/22/2007  . HYPERLIPIDEMIA 01/12/2007  . INSOMNIA, CHRONIC  01/12/2007  . Headache 01/12/2007   Past Medical History:  Diagnosis Date  . Bladder problem   . Hyperlipidemia   . Insomnia   . Migraines   . Osteoporosis   . Vitamin D deficiency     Family History  Problem Relation Age of Onset  . Stroke Mother   . Heart attack Mother   . Atrial fibrillation Mother   . Heart failure Mother   . Stroke Father   . Heart failure Father   . Hyperlipidemia Brother   . Hypertension Brother   . Heart disease Maternal Grandfather   . Heart disease Paternal Grandmother   . Heart disease Paternal Grandfather   . Hyperlipidemia Sister   . Hyperlipidemia Brother   . Hypertension Brother     Past Surgical History:  Procedure Laterality Date  . ADENOIDECTOMY    . TUBAL LIGATION     Social History   Occupational History  . Occupation: retired-- IT trainer  Tobacco Use  . Smoking status: Never Smoker  . Smokeless tobacco: Never Used  Substance and Sexual Activity  . Alcohol use: No  . Drug use: No  . Sexual activity: Not Currently    Partners: Male    Comment: pt actually said whether she is sexually active or not is none of the govt business

## 2018-04-28 DIAGNOSIS — J392 Other diseases of pharynx: Secondary | ICD-10-CM | POA: Diagnosis not present

## 2018-04-28 DIAGNOSIS — G4700 Insomnia, unspecified: Secondary | ICD-10-CM | POA: Diagnosis not present

## 2018-04-28 DIAGNOSIS — K219 Gastro-esophageal reflux disease without esophagitis: Secondary | ICD-10-CM | POA: Diagnosis not present

## 2018-04-28 DIAGNOSIS — Z6824 Body mass index (BMI) 24.0-24.9, adult: Secondary | ICD-10-CM | POA: Diagnosis not present

## 2018-05-02 ENCOUNTER — Ambulatory Visit (INDEPENDENT_AMBULATORY_CARE_PROVIDER_SITE_OTHER): Payer: Self-pay | Admitting: Orthopedic Surgery

## 2018-05-10 DIAGNOSIS — K219 Gastro-esophageal reflux disease without esophagitis: Secondary | ICD-10-CM | POA: Diagnosis not present

## 2018-05-17 ENCOUNTER — Encounter (INDEPENDENT_AMBULATORY_CARE_PROVIDER_SITE_OTHER): Payer: Self-pay | Admitting: Orthopedic Surgery

## 2018-05-17 ENCOUNTER — Ambulatory Visit (INDEPENDENT_AMBULATORY_CARE_PROVIDER_SITE_OTHER): Payer: Medicare HMO | Admitting: Orthopedic Surgery

## 2018-05-17 VITALS — Ht 61.5 in | Wt 131.0 lb

## 2018-05-17 DIAGNOSIS — M21612 Bunion of left foot: Secondary | ICD-10-CM

## 2018-05-17 DIAGNOSIS — M205X2 Other deformities of toe(s) (acquired), left foot: Secondary | ICD-10-CM | POA: Diagnosis not present

## 2018-05-17 NOTE — Progress Notes (Signed)
Office Visit Note   Patient: Kylie Wheeler           Date of Birth: 03/20/1946           MRN: [id number] Visit Date: 05/17/2018              Requested by: Reynold Bowen, [place], [place] [address] PCP: Reynold Bowen, MD  Chief Complaint  Patient presents with  . Left Foot - Pain    Referred by Dr Marlou Sa; Left foot bunion      HPI: Patient is a 72 year old woman who presents with increasing pain from claw toeing of toes 2 3 and 4 as well as pain over the medial eminence of her bunion.  She has tried shoewear modifications without relief.  She complains of pain with activities of daily living.  Assessment & Plan: Visit Diagnoses:  1. Bunion of great toe of left foot   2. Acquired claw toe, left     Plan: Discussed that we could continue with conservative treatment versus surgical intervention.  Discussed that surgery would require a chevron osteotomy of the first metatarsal as well as a Weil osteotomy of metatarsals 2 3 and 4.  Risk and benefits were discussed including infection neurovascular injury DVT need for additional surgery.  Patient states she would like to proceed with surgery in January.  We will set this up at her convenience.  Follow-Up Instructions: Return in about 1 week (around 05/24/2018).   Ortho Exam  Patient is alert, oriented, no adenopathy, well-dressed, normal affect, normal respiratory effort. Examination patient has a good dorsalis pedis and posterior tibial pulse.  She has no pain with passive range of motion of the MTP joint no signs of hallux rigidus.  She is tender to palpation over the medial eminence of her bunion.  She has tenderness to palpation beneath the second third and fourth metatarsal heads these are prominent and she has flexible clawing of the lesser toes as well.  Review of her radiographs shows a moderate hallux valgus deformity with a congruent joint with a long second third and fourth metatarsal.  Imaging: No  results found. No images are attached to the encounter.  Labs: Lab Results  Component Value Date   GRAMSTAIN Rare 03/14/2014   GRAMSTAIN WBC present-predominately PMN 03/14/2014   GRAMSTAIN No Squamous Epithelial Cells Seen 03/14/2014   GRAMSTAIN No Organisms Seen 03/14/2014   LABORGA NO GROWTH 2 DAYS 03/14/2014     Lab Results  Component Value Date   ALBUMIN 4.5 07/31/2014   ALBUMIN 4.1 10/02/2012   ALBUMIN 4.5 06/23/2012    Body mass index is 24.35 kg/m.  Orders:  No orders of the defined types were placed in this encounter.  No orders of the defined types were placed in this encounter.    Procedures: No procedures performed  Clinical Data: No additional findings.  ROS:  All other systems negative, except as noted in the HPI. Review of Systems  Objective: Vital Signs: Ht 5' 1.5" (1.562 m)   Wt 131 lb (59.4 kg)   BMI 24.35 kg/m   Specialty Comments:  No specialty comments available.  PMFS History: Patient Active Problem List   Diagnosis Date Noted  . Anxiety 12/14/2016  . Lesion of vertebra, T1 09/14/2016  . Overweight 08/25/2016  . Chronic migraine without aura without status migrainosus, not intractable 05/28/2016  . Cervicogenic headache 03/30/2016  . Nonallopathic lesion of cervical region 03/30/2016  . Nonallopathic lesion of thoracic region 03/30/2016  .  Nonallopathic lesion of lumbosacral region 03/30/2016  . Allergic reaction 03/14/2014  . Syncope 11/29/2012  . Vitamin D deficiency 08/14/2010  . OSTEOPOROSIS 08/14/2010  . COLONIC POLYPS, HX OF 08/14/2010  . VERTIGO 08/22/2007  . HYPERLIPIDEMIA 01/12/2007  . INSOMNIA, CHRONIC 01/12/2007  . Headache 01/12/2007   Past Medical History:  Diagnosis Date  . Bladder problem   . Hyperlipidemia   . Insomnia   . Migraines   . Osteoporosis   . Vitamin D deficiency     Family History  Problem Relation Age of Onset  . Stroke Mother   . Heart attack Mother   . Atrial fibrillation Mother     . Heart failure Mother   . Stroke Father   . Heart failure Father   . Hyperlipidemia Brother   . Hypertension Brother   . Heart disease Maternal Grandfather   . Heart disease Paternal Grandmother   . Heart disease Paternal Grandfather   . Hyperlipidemia Sister   . Hyperlipidemia Brother   . Hypertension Brother     Past Surgical History:  Procedure Laterality Date  . ADENOIDECTOMY    . TUBAL LIGATION     Social History   Occupational History  . Occupation: retired-- IT trainer  Tobacco Use  . Smoking status: Never Smoker  . Smokeless tobacco: Never Used  Substance and Sexual Activity  . Alcohol use: No  . Drug use: No  . Sexual activity: Not Currently    Partners: Male    Comment: pt actually said whether she is sexually active or not is none of the govt business

## 2018-06-02 DIAGNOSIS — M5481 Occipital neuralgia: Secondary | ICD-10-CM | POA: Diagnosis not present

## 2018-06-02 DIAGNOSIS — G8929 Other chronic pain: Secondary | ICD-10-CM | POA: Diagnosis not present

## 2018-06-14 DIAGNOSIS — M5481 Occipital neuralgia: Secondary | ICD-10-CM | POA: Diagnosis not present

## 2018-06-14 DIAGNOSIS — M47812 Spondylosis without myelopathy or radiculopathy, cervical region: Secondary | ICD-10-CM | POA: Diagnosis not present

## 2018-06-15 HISTORY — PX: CERVICAL SPINE SURGERY: SHX589

## 2018-07-11 ENCOUNTER — Telehealth (INDEPENDENT_AMBULATORY_CARE_PROVIDER_SITE_OTHER): Payer: Self-pay | Admitting: Orthopedic Surgery

## 2018-07-11 NOTE — Telephone Encounter (Signed)
I left a message for Ms. Teale to return my call.  Need to discuss foot surgery that was mentioned at her last appointment.

## 2018-08-15 ENCOUNTER — Other Ambulatory Visit (HOSPITAL_COMMUNITY): Payer: Self-pay | Admitting: *Deleted

## 2018-08-16 ENCOUNTER — Other Ambulatory Visit: Payer: Self-pay

## 2018-08-16 ENCOUNTER — Ambulatory Visit (HOSPITAL_COMMUNITY)
Admission: RE | Admit: 2018-08-16 | Discharge: 2018-08-16 | Disposition: A | Discharge status: Home / Self Care | Payer: Medicare HMO | Source: Ambulatory Visit | Attending: Endocrinology | Admitting: Endocrinology

## 2018-08-16 DIAGNOSIS — M810 Age-related osteoporosis without current pathological fracture: Secondary | ICD-10-CM | POA: Insufficient documentation

## 2018-08-16 MED ORDER — DENOSUMAB 60 MG/ML ~~LOC~~ SOSY
60.0000 mg | PREFILLED_SYRINGE | Freq: Once | SUBCUTANEOUS | Status: AC
  Administered 2018-08-16: 60 mg via SUBCUTANEOUS

## 2018-08-16 NOTE — Discharge Instructions (Signed)
Denosumab injection °What is this medicine? °DENOSUMAB (den oh sue mab) slows bone breakdown. Prolia is used to treat osteoporosis in women after menopause and in men, and in people who are taking corticosteroids for 6 months or more. Xgeva is used to treat a high calcium level due to cancer and to prevent bone fractures and other bone problems caused by multiple myeloma or cancer bone metastases. Xgeva is also used to treat giant cell tumor of the bone. °This medicine may be used for other purposes; ask your health care provider or pharmacist if you have questions. °COMMON BRAND NAME(S): Prolia, XGEVA °What should I tell my health care provider before I take this medicine? °They need to know if you have any of these conditions: °-dental disease °-having surgery or tooth extraction °-infection °-kidney disease °-low levels of calcium or Vitamin D in the blood °-malnutrition °-on hemodialysis °-skin conditions or sensitivity °-thyroid or parathyroid disease °-an unusual reaction to denosumab, other medicines, foods, dyes, or preservatives °-pregnant or trying to get pregnant °-breast-feeding °How should I use this medicine? °This medicine is for injection under the skin. It is given by a health care professional in a hospital or clinic setting. °A special MedGuide will be given to you before each treatment. Be sure to read this information carefully each time. °For Prolia, talk to your pediatrician regarding the use of this medicine in children. Special care may be needed. For Xgeva, talk to your pediatrician regarding the use of this medicine in children. While this drug may be prescribed for children as young as 13 years for selected conditions, precautions do apply. °Overdosage: If you think you have taken too much of this medicine contact a poison control center or emergency room at once. °NOTE: This medicine is only for you. Do not share this medicine with others. °What if I miss a dose? °It is important not to  miss your dose. Call your doctor or health care professional if you are unable to keep an appointment. °What may interact with this medicine? °Do not take this medicine with any of the following medications: °-other medicines containing denosumab °This medicine may also interact with the following medications: °-medicines that lower your chance of fighting infection °-steroid medicines like prednisone or cortisone °This list may not describe all possible interactions. Give your health care provider a list of all the medicines, herbs, non-prescription drugs, or dietary supplements you use. Also tell them if you smoke, drink alcohol, or use illegal drugs. Some items may interact with your medicine. °What should I watch for while using this medicine? °Visit your doctor or health care professional for regular checks on your progress. Your doctor or health care professional may order blood tests and other tests to see how you are doing. °Call your doctor or health care professional for advice if you get a fever, chills or sore throat, or other symptoms of a cold or flu. Do not treat yourself. This drug may decrease your body's ability to fight infection. Try to avoid being around people who are sick. °You should make sure you get enough calcium and vitamin D while you are taking this medicine, unless your doctor tells you not to. Discuss the foods you eat and the vitamins you take with your health care professional. °See your dentist regularly. Brush and floss your teeth as directed. Before you have any dental work done, tell your dentist you are receiving this medicine. °Do not become pregnant while taking this medicine or for 5 months   after stopping it. Talk with your doctor or health care professional about your birth control options while taking this medicine. Women should inform their doctor if they wish to become pregnant or think they might be pregnant. There is a potential for serious side effects to an unborn  child. Talk to your health care professional or pharmacist for more information. °What side effects may I notice from receiving this medicine? °Side effects that you should report to your doctor or health care professional as soon as possible: °-allergic reactions like skin rash, itching or hives, swelling of the face, lips, or tongue °-bone pain °-breathing problems °-dizziness °-jaw pain, especially after dental work °-redness, blistering, peeling of the skin °-signs and symptoms of infection like fever or chills; cough; sore throat; pain or trouble passing urine °-signs of low calcium like fast heartbeat, muscle cramps or muscle pain; pain, tingling, numbness in the hands or feet; seizures °-unusual bleeding or bruising °-unusually weak or tired °Side effects that usually do not require medical attention (report to your doctor or health care professional if they continue or are bothersome): °-constipation °-diarrhea °-headache °-joint pain °-loss of appetite °-muscle pain °-runny nose °-tiredness °-upset stomach °This list may not describe all possible side effects. Call your doctor for medical advice about side effects. You may report side effects to FDA at 1-800-FDA-1088. °Where should I keep my medicine? °This medicine is only given in a clinic, doctor's office, or other health care setting and will not be stored at home. °NOTE: This sheet is a summary. It may not cover all possible information. If you have questions about this medicine, talk to your doctor, pharmacist, or health care provider. °© 2019 [employer] (2017-10-08 16:10:44) ° °

## 2019-03-03 ENCOUNTER — Encounter: Payer: Self-pay | Admitting: Cardiology

## 2019-03-03 ENCOUNTER — Ambulatory Visit: Payer: Medicare HMO | Admitting: Cardiology

## 2019-03-03 ENCOUNTER — Other Ambulatory Visit: Payer: Self-pay

## 2019-03-03 VITALS — BP 130/74 | Temp 96.5°F | Ht 61.0 in | Wt 154.7 lb

## 2019-03-03 DIAGNOSIS — R0600 Dyspnea, unspecified: Secondary | ICD-10-CM

## 2019-03-03 DIAGNOSIS — E7800 Pure hypercholesterolemia, unspecified: Secondary | ICD-10-CM

## 2019-03-03 DIAGNOSIS — R079 Chest pain, unspecified: Secondary | ICD-10-CM

## 2019-03-03 DIAGNOSIS — R0609 Other forms of dyspnea: Secondary | ICD-10-CM

## 2019-03-03 DIAGNOSIS — R0789 Other chest pain: Secondary | ICD-10-CM | POA: Diagnosis not present

## 2019-03-03 MED ORDER — SIMVASTATIN 20 MG PO TABS: 20.0000 mg | ORAL_TABLET | Freq: Every day | ORAL | 2 refills | Status: DC

## 2019-03-03 NOTE — Progress Notes (Signed)
 Primary Physician/Referring:  South, Stephen, MD  Patient ID: Kylie Wheeler, female    DOB: 10/01/1945, 73 y.o.   MRN: [id number]  Chief Complaint  Patient presents with  . New Patient (Initial Visit)  . Chest Pain  . Hyperlipidemia   HPI:    Kylie Wheeler  is a 73 y.o.  Caucasian female seen by me in July 2014 for evaluation of syncope. She had normal echocardiogram and symptoms were attributed to vasovagal syncope.   She was again seen in December 2014 for evaluation ?TIA. She was scheduled for carotid artery duplex which was normal and head CT which revealed chronic microvascular ischemia without acute infarct.  She was started on Lipitor and aspirin at that time which she has since discontinued. She presents today for reevaluation due to intermittent episodes of chest pain. She states pain occurs randomly throughout the day without relation to exertion.  She is very active and exercises regularly without any chest pain or shortness of breath. She states she discontinued statin due to fear of side effects, although she denies having any side effects to medication, States that she is willing to take the medication  if I feel that she should be on a statin.. She denies any PND, orthopnea, dizziness, syncope, or symptoms suggestive of recurrent TIA or claudication.  Past Medical History:  Diagnosis Date  . Bladder problem   . Hyperlipidemia   . Insomnia   . Migraines   . Osteoporosis   . Vitamin D deficiency    Past Surgical History:  Procedure Laterality Date  . ADENOIDECTOMY    . TUBAL LIGATION     Social History   Socioeconomic History  . Marital status: Widowed    Spouse name: Not on file  . Number of children: 3  . Years of education: Not on file  . Highest education level: Not on file  Occupational History  . Occupation: retired-- [employer]  Social Needs  . Financial resource strain: Not on file  . Food insecurity    Worry: Not on file   Inability: Not on file  . Transportation needs    Medical: Not on file    Non-medical: Not on file  Tobacco Use  . Smoking status: Never Smoker  . Smokeless tobacco: Never Used  Substance and Sexual Activity  . Alcohol use: No  . Drug use: No  . Sexual activity: Not Currently    Partners: Male    Comment: pt actually said whether she is sexually active or not is none of the govt business  Lifestyle  . Physical activity    Days per week: Not on file    Minutes per session: Not on file  . Stress: Not on file  Relationships  . Social connections    Talks on phone: Not on file    Gets together: Not on file    Attends religious service: Not on file    Active member of club or organization: Not on file    Attends meetings of clubs or organizations: Not on file    Relationship status: Not on file  . Intimate partner violence    Fear of current or ex partner: Not on file    Emotionally abused: Not on file    Physically abused: Not on file    Forced sexual activity: Not on file  Other Topics Concern  . Not on file  Social History Narrative  . Not on file   ROS  Review of   Systems  Constitution: Negative for chills, decreased appetite, malaise/fatigue and weight gain.  Cardiovascular: Positive for chest pain. Negative for dyspnea on exertion, leg swelling and syncope.  Endocrine: Negative for cold intolerance.  Hematologic/Lymphatic: Does not bruise/bleed easily.  Musculoskeletal: Positive for arthritis and neck pain. Negative for joint swelling.  Gastrointestinal: Negative for abdominal pain, anorexia, change in bowel habit, hematochezia and melena.  Neurological: Positive for headaches (occipital neuralgia and migraine). Negative for light-headedness.  Psychiatric/Behavioral: Negative for depression and substance abuse. The patient is nervous/anxious.   All other systems reviewed and are negative.  Objective   Vitals with BMI 03/03/2019 08/16/2018 05/17/2018  Height 5' 1" - 5'  1.5"  Weight 154 lbs 11 oz - 131 lbs  BMI 29.25 - 24.35  Systolic 130 125 -  Diastolic 74 50 -  Pulse - 88 -    Blood pressure 130/74, temperature (!) 96.5 F (35.8 C), height 5' 1" (1.549 m), weight 154 lb 11.2 oz (70.2 kg), SpO2 98 %. Body mass index is 29.23 kg/m.   Physical Exam  Constitutional:  Short stature and overweight in no acute distress  HENT:  Head: Atraumatic.  Eyes: Conjunctivae are normal.  Neck: Neck supple. No JVD present. No thyromegaly present.  Cardiovascular: Normal rate, regular rhythm, normal heart sounds and intact distal pulses. Exam reveals no gallop.  No murmur heard. Pulmonary/Chest: Effort normal and breath sounds normal.  Abdominal: Soft. Bowel sounds are normal.  Musculoskeletal: Normal range of motion.  Neurological: She is alert.  Skin: Skin is warm and dry.  Psychiatric: She has a normal mood and affect.   Radiology: No results found.  Laboratory examination:   Labs 07/13/2018: Serum glucose 87 mg, BUN 26, creatinine 0.8, eGFR 70 mL, CMP normal.  CBC normal.  Total cholesterol: 88, triglycerides 143, HDL 82, LDL 177.  Non-HDL cholesterol 206.  Vitamin D 46.5.  No results for input(s): NA, K, CL, CO2, GLUCOSE, BUN, CREATININE, CALCIUM, GFRNONAA, GFRAA in the last 8760 hours. CMP Latest Ref Rng & Units 06/19/2015 07/31/2014 10/02/2012  Glucose 70 - 99 mg/dL - 103(H) 93  BUN 6 - 23 mg/dL - 30(H) 15  Creatinine 0.44 - 1.00 mg/dL 0.90 2.08(H) 0.79  Sodium 135 - 145 mmol/L - 136 138  Potassium 3.5 - 5.1 mmol/L - 4.2 4.9  Chloride 96 - 112 mmol/L - 103 103  CO2 19 - 32 mmol/L - 22 27  Calcium 8.4 - 10.5 mg/dL - 9.2 9.9  Total Protein 6.0 - 8.3 g/dL - 7.3 7.3  Total Bilirubin 0.3 - 1.2 mg/dL - 1.2 0.7  Alkaline Phos 39 - 117 U/L - 47 64  AST 0 - 37 U/L - 36 25  ALT 0 - 35 U/L - 24 14   CBC Latest Ref Rng & Units 07/31/2014 10/02/2012 10/02/2012  WBC 4.0 - 10.5 K/uL 8.0 5.9 -  Hemoglobin 12.0 - 15.0 g/dL 13.9 14.6 15.3(H)  Hematocrit 36.0 -  46.0 % 42.5 41.3 45.0  Platelets 150 - 400 K/uL 208 204 -   Lipid Panel     Component Value Date/Time   CHOL 226 (H) 06/23/2012 1158   TRIG 82.0 06/23/2012 1158   HDL 69.60 06/23/2012 1158   CHOLHDL 3 06/23/2012 1158   VLDL 16.4 06/23/2012 1158   LDLCALC 73 08/22/2007 1039   LDLDIRECT 123.4 06/23/2012 1158   HEMOGLOBIN A1C No results found for: HGBA1C, MPG TSH No results for input(s): TSH in the last 8760 hours. Medications   Allergies  Allergen   Reactions  . Codeine Nausea Only    REACTION: sick on stomach     Prior to Admission medications   Medication Sig Start Date End Date Taking? Authorizing Provider  acyclovir (ZOVIRAX) 200 MG capsule Take 200 mg by mouth as needed.    Yes [provider]  aspirin-acetaminophen-caffeine (EXCEDRIN MIGRAINE) [phone] MG tablet Take by mouth every 6 (six) hours as needed for headache.   Yes [provider]  Cholecalciferol (VITAMIN D3) 2000 UNITS TABS Take 1 tablet by mouth daily.   Yes [provider]  clorazepate (TRANXENE) 7.5 MG tablet TAKE 1 TABLET BY MOUTH AT BEDTIME AS NEEDED 01/04/17  Yes Burns, Claudina Lick, MD  fluticasone Asencion Islam) 50 MCG/ACT nasal spray Reported on 12/11/2015 11/18/15  Yes [provider]  fluvoxaMINE (LUVOX) 25 MG tablet Take 25 mg by mouth at bedtime.   Yes [provider]  MELATONIN PO Take 10 mg by mouth at bedtime.   Yes [provider]  naratriptan (AMERGE) 2.5 MG tablet Take 1 tablet (2.5 mg total) by mouth daily as needed for migraine (migraine). 06/23/16  Yes Binnie Rail, MD     Current Outpatient Medications  Medication Instructions  . acyclovir (ZOVIRAX) 200 mg, Oral, As needed  . aspirin-acetaminophen-caffeine (EXCEDRIN MIGRAINE) 250-250-65 MG tablet Oral, Every 6 hours PRN  . Cholecalciferol (VITAMIN D3) 2000 UNITS TABS 1 tablet, Oral, Daily  . clorazepate (TRANXENE) 7.5 MG tablet TAKE 1 TABLET BY MOUTH AT BEDTIME AS NEEDED  . fluticasone (FLONASE)  50 MCG/ACT nasal spray Reported on 12/11/2015  . fluvoxaMINE (LUVOX) 25 mg, Oral, Daily at bedtime  . MELATONIN PO 10 mg, Oral, Daily at bedtime  . naratriptan (AMERGE) 2.5 mg, Oral, Daily PRN  . simvastatin (ZOCOR) 20 mg, Oral, Daily at bedtime    Cardiac Studies:   Echo- 01/12/13 1.Left ventricular cavity is normal in size.   Normal global wall motion.   Normal systolic global function.   Calculated EF 57%. 2.Mitral valve structurally normal.   Trace mitral regurgitation.    3.Tricuspid valve structurally normal.   Trace tricuspid regurgitation.  Treadmill exercise stress test 10/28/2015: Indications: Chest pain The resting electrocardiogram demonstrated normal sinus rhythm, normal resting conduction, no resting arrhythmias and normal rest repolarization.  The stress electrocardiogram was normal.  There were no significant arrhythmias. Patient exercised on Bruce protocol for  5:25 minutes and achieved  101% of Max Predicted HR (Target HR was >85% MPHR) and  7.05 METS. Stress symptoms included dyspnea. Normal BP response.  Exercise capacity was low. Impression: Normal stress EKG.  Carotid duplex 06/29/2012: No evidence of hemodynamically significant stenosis in the bilateral carotid bifurcation vessels.  Cardiopulmonary stress test performed on 11/14/2012: Functional status normal, peak VO2 94% predicted. Ischemic myocardial dysfunction with decreased stroke volume noted at 4 minutes of exercise. LV dysfunction start set heart rate of 93 bpm. Patient had resting bradycardia. Pulmonary status was normal. Impression: Risk assessment for surgery: High risk, aerobic threshold less than 11 mL per kilogram per minute. Pulmonary status not impaired. Stress EKG was nondiagnostic due to motion artifact. No chest pain was described. Predicted anaerobic threshold was greater than 9.8, patient achieved 9.8. Stress test was terminated due to fatigue.  Assessment     ICD-10-CM   1. Chest pain of uncertain  etiology  Y18.56 EKG 12-Lead    PCV ECHOCARDIOGRAM COMPLETE    PCV CARDIAC STRESS TEST  2. Hypercholesteremia  E78.00 simvastatin (ZOCOR) 20 MG tablet    Lipid Panel With  LDL/HDL Ratio    CMP14+EGFR    TSH  3. Dyspnea on exertion  R06.09 PCV ECHOCARDIOGRAM COMPLETE    PCV CARDIAC STRESS TEST     EKG 03/03/2019: Sinus bradycardia at the rate of 57 bpm, normal axis.  No evidence of ischemia, normal EKG. No significant change from  EKG 10/21/2015: Sinus bradycardia at a rate of 47 bpm  Recommendations:   Referred to me for evaluation of chest pain, chest pain symptoms are occurring mostly during the daytime when she is sitting but not with exertion activity.  She is also noticed mild dyspnea on exertion which she attributes to her age.  Although he was also doing very physical examination is completely normal except for being slightly overweight. She was again seen in December 2014 for evaluation ?TIA. She was scheduled for carotid artery duplex which was normal and head CT which revealed chronic microvascular ischemia without acute infarct.   I reviewed her prior test that was done in 2014 and 15.  But in view of mild dyspnea, recurrence of chest pain, hyperlipidemia, I recommended simple routine treadmill stress test and repeat echocardiogram.  I evaluated her lipids (reviewed her latest labs from PCP), her CV risk indicated calculation score is 13.9% 10 years risk of heart disease or stroke.  Hence I recommended statin, starting a low-dose statins may be appropriate as she is reluctant to taking medications.  I started her on simvastatin 20 mg daily.  I'll check lipids and CMP in [redacted] weeks along with TSH and see her back then.  Jay Ganji, MD, FACC 03/03/2019, 11:27 AM [facility] Pager: [phone] Office: [phone] If no answer Cell [phone] 

## 2019-03-14 ENCOUNTER — Ambulatory Visit (INDEPENDENT_AMBULATORY_CARE_PROVIDER_SITE_OTHER): Payer: Medicare HMO

## 2019-03-14 ENCOUNTER — Other Ambulatory Visit: Payer: Self-pay

## 2019-03-14 DIAGNOSIS — R079 Chest pain, unspecified: Secondary | ICD-10-CM

## 2019-03-14 DIAGNOSIS — R0609 Other forms of dyspnea: Secondary | ICD-10-CM | POA: Diagnosis not present

## 2019-03-14 DIAGNOSIS — R0789 Other chest pain: Secondary | ICD-10-CM | POA: Diagnosis not present

## 2019-03-14 DIAGNOSIS — R0600 Dyspnea, unspecified: Secondary | ICD-10-CM

## 2019-03-22 ENCOUNTER — Other Ambulatory Visit: Payer: Self-pay

## 2019-03-22 ENCOUNTER — Ambulatory Visit (INDEPENDENT_AMBULATORY_CARE_PROVIDER_SITE_OTHER): Payer: Medicare HMO

## 2019-03-22 DIAGNOSIS — R079 Chest pain, unspecified: Secondary | ICD-10-CM | POA: Diagnosis not present

## 2019-03-22 DIAGNOSIS — R0600 Dyspnea, unspecified: Secondary | ICD-10-CM | POA: Diagnosis not present

## 2019-03-22 DIAGNOSIS — R0609 Other forms of dyspnea: Secondary | ICD-10-CM

## 2019-03-29 ENCOUNTER — Other Ambulatory Visit: Payer: Self-pay | Admitting: Cardiology

## 2019-03-29 DIAGNOSIS — E7800 Pure hypercholesterolemia, unspecified: Secondary | ICD-10-CM

## 2019-04-05 LAB — CMP14+EGFR
ALT: 18 IU/L (ref 0–32)
AST: 22 IU/L (ref 0–40)
Albumin/Globulin Ratio: 2 (ref 1.2–2.2)
Albumin: 4.6 g/dL (ref 3.7–4.7)
Alkaline Phosphatase: 46 IU/L (ref 39–117)
BUN/Creatinine Ratio: 17 (ref 12–28)
BUN: 16 mg/dL (ref 8–27)
Bilirubin Total: 0.6 mg/dL (ref 0.0–1.2)
CO2: 23 mmol/L (ref 20–29)
Calcium: 9.7 mg/dL (ref 8.7–10.3)
Chloride: 103 mmol/L (ref 96–106)
Creatinine, Ser: 0.95 mg/dL (ref 0.57–1.00)
GFR calc Af Amer: 69 mL/min/{1.73_m2} (ref >59)
GFR calc non Af Amer: 60 mL/min/{1.73_m2} (ref >59)
Globulin, Total: 2.3 g/dL (ref 1.5–4.5)
Glucose: 89 mg/dL (ref 65–99)
Potassium: 4.3 mmol/L (ref 3.5–5.2)
Sodium: 139 mmol/L (ref 134–144)
Total Protein: 6.9 g/dL (ref 6.0–8.5)

## 2019-04-05 LAB — LIPID PANEL WITH LDL/HDL RATIO
Cholesterol, Total: 187 mg/dL (ref 100–199)
HDL: 95 mg/dL (ref >39)
LDL Chol Calc (NIH): 79 mg/dL (ref 0–99)
LDL/HDL Ratio: 0.8 ratio (ref 0.0–3.2)
Triglycerides: 68 mg/dL (ref 0–149)
VLDL Cholesterol Cal: 13 mg/dL (ref 5–40)

## 2019-04-05 LAB — TSH: TSH: 2.44 u[IU]/mL (ref 0.450–4.500)

## 2019-04-14 ENCOUNTER — Encounter: Payer: Self-pay | Admitting: Cardiology

## 2019-04-14 ENCOUNTER — Ambulatory Visit: Payer: Medicare HMO | Admitting: Cardiology

## 2019-04-14 ENCOUNTER — Other Ambulatory Visit: Payer: Self-pay

## 2019-04-14 VITALS — BP 140/77 | HR 56 | Temp 96.2°F | Ht 61.0 in | Wt 156.3 lb

## 2019-04-14 DIAGNOSIS — E7800 Pure hypercholesterolemia, unspecified: Secondary | ICD-10-CM | POA: Diagnosis not present

## 2019-04-14 DIAGNOSIS — R0609 Other forms of dyspnea: Secondary | ICD-10-CM

## 2019-04-14 DIAGNOSIS — R079 Chest pain, unspecified: Secondary | ICD-10-CM

## 2019-04-14 DIAGNOSIS — R0600 Dyspnea, unspecified: Secondary | ICD-10-CM | POA: Diagnosis not present

## 2019-04-14 MED ORDER — SIMVASTATIN 20 MG PO TABS: 20.0000 mg | ORAL_TABLET | Freq: Every day | ORAL | 1 refills | Status: DC

## 2019-04-14 NOTE — Progress Notes (Signed)
Primary Physician/Referring:  Reynold Bowen, MD  Patient ID: Kylie Wheeler, female    DOB: 16-Jan-1946, 73 y.o.   MRN: [id number]  Chief Complaint  Patient presents with  . Chest Pain  . Results    gxt, echo, labs  . Follow-up    6wk   HPI:    Kylie Wheeler  is a 73 y.o.  Caucasian female seen by me in July 2014 for evaluation of syncope. She had normal echocardiogram and symptoms were attributed to vasovagal syncope. She was again seen in December 2014 for evaluation ?TIA. She was scheduled for carotid artery duplex which was normal and head CT which revealed chronic microvascular ischemia without acute infarct. She was seen by me on 03/03/2019 for atypical chest pain, underwent treadmill stress test and echocardiogram and presents for follow-up.  States that she continues to remain active but transiently gets chest discomfort.  No dyspnea on exertion, no palpitations, no dizziness or syncope.  She states she discontinued statin due to fear of side effects, and after last office visit and discussion, due to cerebral atherosclerosis, she was started on low-dose simvastatin 20 mg daily.  She underwent labs as well. No new symptoms today. No further chest pain.  Past Medical History:  Diagnosis Date  . Bladder problem   . Hyperlipidemia   . Insomnia   . Migraines   . Osteoporosis   . Vitamin D deficiency    Past Surgical History:  Procedure Laterality Date  . ADENOIDECTOMY    . TUBAL LIGATION     Social History   Socioeconomic History  . Marital status: Widowed    Spouse name: Not on file  . Number of children: 3  . Years of education: Not on file  . Highest education level: Not on file  Occupational History  . Occupation: retired-- IT trainer  Social Needs  . Financial resource strain: Not on file  . Food insecurity    Worry: Not on file    Inability: Not on file  . Transportation needs    Medical: Not on file    Non-medical: Not on file  Tobacco Use  .  Smoking status: Never Smoker  . Smokeless tobacco: Never Used  Substance and Sexual Activity  . Alcohol use: No  . Drug use: No  . Sexual activity: Not Currently    Partners: Male    Comment: pt actually said whether she is sexually active or not is none of the govt business  Lifestyle  . Physical activity    Days per week: Not on file    Minutes per session: Not on file  . Stress: Not on file  Relationships  . Social Herbalist on phone: Not on file    Gets together: Not on file    Attends religious service: Not on file    Active member of club or organization: Not on file    Attends meetings of clubs or organizations: Not on file    Relationship status: Not on file  . Intimate partner violence    Fear of current or ex partner: Not on file    Emotionally abused: Not on file    Physically abused: Not on file    Forced sexual activity: Not on file  Other Topics Concern  . Not on file  Social History Narrative  . Not on file   ROS  Review of Systems  Constitution: Negative for chills, decreased appetite, malaise/fatigue and weight gain.  Cardiovascular: Negative for chest pain, dyspnea on exertion, leg swelling and syncope.  Endocrine: Negative for cold intolerance.  Hematologic/Lymphatic: Does not bruise/bleed easily.  Musculoskeletal: Positive for arthritis and neck pain. Negative for joint swelling.  Gastrointestinal: Negative for abdominal pain, anorexia, change in bowel habit, hematochezia and melena.  Neurological: Positive for headaches (occipital neuralgia and migraine). Negative for light-headedness.  Psychiatric/Behavioral: Negative for depression and substance abuse. The patient is nervous/anxious.   All other systems reviewed and are negative.  Objective   Vitals with BMI 04/14/2019 03/03/2019 08/16/2018  Height '5\' 1"'  '5\' 1"'  -  Weight 156 lbs 5 oz 154 lbs 11 oz -  BMI 16.07 37.10 -  Systolic 626 948 546  Diastolic 77 74 50  Pulse 56 - 88    Blood  pressure 140/77, pulse (!) 56, temperature (!) 96.2 F (35.7 C), height '5\' 1"'  (1.549 m), weight 156 lb 4.8 oz (70.9 kg), SpO2 97 %. Body mass index is 29.53 kg/m.   Physical Exam  Constitutional:  Short stature and overweight in no acute distress  HENT:  Head: Atraumatic.  Eyes: Conjunctivae are normal.  Neck: Neck supple. No JVD present. No thyromegaly present.  Cardiovascular: Normal rate, regular rhythm, normal heart sounds and intact distal pulses. Exam reveals no gallop.  No murmur heard. Pulmonary/Chest: Effort normal and breath sounds normal.  Abdominal: Soft. Bowel sounds are normal.  Musculoskeletal: Normal range of motion.  Neurological: She is alert.  Skin: Skin is warm and dry.  Psychiatric: She has a normal mood and affect.   Radiology: No results found.  Laboratory examination:   Labs 07/13/2018: Serum glucose 87 mg, BUN 26, creatinine 0.8, eGFR 70 mL, CMP normal.  CBC normal.  Total cholesterol: 88, triglycerides 143, HDL 82, LDL 177.  Non-HDL cholesterol 206.  Vitamin D 46.5.  Recent Labs    04/04/19 1024  NA 139  K 4.3  CL 103  CO2 23  GLUCOSE 89  BUN 16  CREATININE 0.95  CALCIUM 9.7  GFRNONAA 60  GFRAA 69   CMP Latest Ref Rng & Units 04/04/2019 06/19/2015 07/31/2014  Glucose 65 - 99 mg/dL 89 - 103(H)  BUN 8 - 27 mg/dL 16 - 30(H)  Creatinine 0.57 - 1.00 mg/dL 0.95 0.90 2.08(H)  Sodium 134 - 144 mmol/L 139 - 136  Potassium 3.5 - 5.2 mmol/L 4.3 - 4.2  Chloride 96 - 106 mmol/L 103 - 103  CO2 20 - 29 mmol/L 23 - 22  Calcium 8.7 - 10.3 mg/dL 9.7 - 9.2  Total Protein 6.0 - 8.5 g/dL 6.9 - 7.3  Total Bilirubin 0.0 - 1.2 mg/dL 0.6 - 1.2  Alkaline Phos 39 - 117 IU/L 46 - 47  AST 0 - 40 IU/L 22 - 36  ALT 0 - 32 IU/L 18 - 24   CBC Latest Ref Rng & Units 07/31/2014 10/02/2012 10/02/2012  WBC 4.0 - 10.5 K/uL 8.0 5.9 -  Hemoglobin 12.0 - 15.0 g/dL 13.9 14.6 15.3(H)  Hematocrit 36.0 - 46.0 % 42.5 41.3 45.0  Platelets 150 - 400 K/uL 208 204 -   Lipid Panel      Component Value Date/Time   CHOL 187 04/04/2019 1024   TRIG 68 04/04/2019 1024   HDL 95 04/04/2019 1024   CHOLHDL 3 06/23/2012 1158   VLDL 16.4 06/23/2012 1158   LDLCALC 79 04/04/2019 1024   LDLDIRECT 123.4 06/23/2012 1158   HEMOGLOBIN A1C No results found for: HGBA1C, MPG TSH Recent Labs    04/04/19 1024  TSH 2.440   Medications   Allergies  Allergen Reactions  . Codeine Nausea Only    REACTION: sick on stomach     Prior to Admission medications   Medication Sig Start Date End Date Taking? Authorizing Provider  acyclovir (ZOVIRAX) 200 MG capsule Take 200 mg by mouth as needed.    Yes [provider]  aspirin-acetaminophen-caffeine (EXCEDRIN MIGRAINE) [phone] MG tablet Take by mouth every 6 (six) hours as needed for headache.   Yes [provider]  Cholecalciferol (VITAMIN D3) 2000 UNITS TABS Take 1 tablet by mouth daily.   Yes [provider]  clorazepate (TRANXENE) 7.5 MG tablet TAKE 1 TABLET BY MOUTH AT BEDTIME AS NEEDED 01/04/17  Yes Burns, Claudina Lick, MD  fluticasone Asencion Islam) 50 MCG/ACT nasal spray Reported on 12/11/2015 11/18/15  Yes [provider]  fluvoxaMINE (LUVOX) 25 MG tablet Take 25 mg by mouth at bedtime.   Yes [provider]  MELATONIN PO Take 10 mg by mouth at bedtime.   Yes [provider]  naratriptan (AMERGE) 2.5 MG tablet Take 1 tablet (2.5 mg total) by mouth daily as needed for migraine (migraine). 06/23/16  Yes Binnie Rail, MD     Current Outpatient Medications  Medication Instructions  . acyclovir (ZOVIRAX) 200 mg, Oral, As needed  . aspirin-acetaminophen-caffeine (EXCEDRIN MIGRAINE) 250-250-65 MG tablet Oral, Every 6 hours PRN  . Cholecalciferol (VITAMIN D3) 2000 UNITS TABS 1 tablet, Oral, Daily  . fluticasone (FLONASE) 50 MCG/ACT nasal spray As needed  . fluvoxaMINE (LUVOX) 25 mg, Oral, Daily at bedtime  . MELATONIN PO 10 mg, Oral, Daily at bedtime  . naratriptan (AMERGE) 2.5 mg, Oral, Daily  PRN  . simvastatin (ZOCOR) 20 mg, Oral, Daily at bedtime  . TURMERIC PO Oral, Daily  . zinc gluconate 50 MG tablet Oral, Daily, 2 tabs qd     Cardiac Studies:   Carotid duplex 06/29/2012: No evidence of hemodynamically significant stenosis in the bilateral carotid bifurcation vessels.  Cardiopulmonary stress test performed on 11/14/2012: Functional status normal, peak VO2 94% predicted. Ischemic myocardial dysfunction with decreased stroke volume noted at 4 minutes of exercise. LV dysfunction start set heart rate of 93 bpm. Patient had resting bradycardia. Pulmonary status was normal. Impression: Risk assessment for surgery: High risk, aerobic threshold less than 11 mL per kilogram per minute. Pulmonary status not impaired. Stress EKG was nondiagnostic due to motion artifact. No chest pain was described. Predicted anaerobic threshold was greater than 9.8, patient achieved 9.8. Stress test was terminated due to fatigue.  Treadmill stress test 03/22/2019: Exercise treadmill stress test performed using Bruce protocol.  Patient reached 8.6 METS, and 112% of age predicted maximum heart rate.  Exercise capacity was low normal.  No chest pain reported.  Normal heart rate and hemodynamic response. Stress EKG revealed no ischemic changes. Normal stress test.  No significant change from 10/28/2015.  Echocardiogram 03/14/2019:  Left ventricle cavity is normal in size. Normal left ventricular wall thickness. Normal LV systolic function with EF 64%. Normal global wall motion. Normal diastolic filling pattern.  Trileaflet aortic valve with no regurgitation noted. Mild (Grade I) mitral regurgitation. Mild tricuspid regurgitation. No evidence of pulmonary hypertension.  Assessment     ICD-10-CM   1. Chest pain of uncertain etiology  L87.5   2. Dyspnea on exertion  R06.00   3. Hypercholesteremia  E78.00 simvastatin (ZOCOR) 20 MG tablet    EKG 03/03/2019: Sinus bradycardia at the rate of 57 bpm, normal  axis.  No  evidence of ischemia, normal EKG. No significant change from  EKG 10/21/2015: Sinus bradycardia at a rate of 47 bpm  Recommendations:   Patient is here on a 5 to 6-week follow-up of chest pain, dyspnea on exertion and hyperlipidemia.  I reviewed the test results, she has reduced exercise tolerance but otherwise normal stress test.  Due to cerebral atherosclerosis noted by CT scan remotely in 2014, elevated lipids, she was started on simvastatin 20 mg daily which she is tolerating.  Lipids have improved remarkably well.  Normal CMP and also TSH.  I reassured her, I will see her back on a as needed basis.  We will request [facility] to take over her care.  Adrian Prows, MD, [facility] 04/14/2019, 11:15 AM [facility] [place] Pager: [phone] Office: [phone] If no answer Cell [phone]

## 2019-06-19 ENCOUNTER — Other Ambulatory Visit: Payer: Self-pay | Admitting: Endocrinology

## 2019-06-19 DIAGNOSIS — Z1231 Encounter for screening mammogram for malignant neoplasm of breast: Secondary | ICD-10-CM

## 2019-06-30 NOTE — Telephone Encounter (Signed)
From patient.

## 2019-07-11 ENCOUNTER — Ambulatory Visit: Payer: Medicare HMO

## 2019-07-20 ENCOUNTER — Ambulatory Visit: Payer: Medicare HMO

## 2019-07-27 ENCOUNTER — Ambulatory Visit
Admission: RE | Admit: 2019-07-27 | Discharge: 2019-07-27 | Disposition: A | Discharge status: Home / Self Care | Payer: Medicare HMO | Source: Ambulatory Visit | Attending: Endocrinology | Admitting: Endocrinology

## 2019-07-27 ENCOUNTER — Other Ambulatory Visit: Payer: Self-pay

## 2019-07-27 DIAGNOSIS — Z1231 Encounter for screening mammogram for malignant neoplasm of breast: Secondary | ICD-10-CM

## 2019-08-01 ENCOUNTER — Ambulatory Visit: Payer: Medicare HMO

## 2019-08-31 ENCOUNTER — Ambulatory Visit (INDEPENDENT_AMBULATORY_CARE_PROVIDER_SITE_OTHER): Payer: Medicare HMO

## 2019-08-31 ENCOUNTER — Other Ambulatory Visit: Payer: Self-pay

## 2019-08-31 ENCOUNTER — Ambulatory Visit: Payer: Medicare HMO | Admitting: Orthopedic Surgery

## 2019-08-31 DIAGNOSIS — M1811 Unilateral primary osteoarthritis of first carpometacarpal joint, right hand: Secondary | ICD-10-CM

## 2019-08-31 DIAGNOSIS — M79641 Pain in right hand: Secondary | ICD-10-CM | POA: Diagnosis not present

## 2019-08-31 DIAGNOSIS — M79671 Pain in right foot: Secondary | ICD-10-CM

## 2019-08-31 DIAGNOSIS — R29898 Other symptoms and signs involving the musculoskeletal system: Secondary | ICD-10-CM | POA: Diagnosis not present

## 2019-08-31 NOTE — Progress Notes (Signed)
My MD are not

## 2019-09-01 ENCOUNTER — Encounter: Payer: Self-pay | Admitting: Orthopedic Surgery

## 2019-09-01 DIAGNOSIS — M1811 Unilateral primary osteoarthritis of first carpometacarpal joint, right hand: Secondary | ICD-10-CM

## 2019-09-01 MED ORDER — METHYLPREDNISOLONE ACETATE 40 MG/ML IJ SUSP
13.3300 mg | INTRAMUSCULAR | Status: AC | PRN
  Administered 2019-09-01: 13.33 mg via INTRA_ARTICULAR

## 2019-09-01 MED ORDER — LIDOCAINE HCL 1 % IJ SOLN
3.0000 mL | INTRAMUSCULAR | Status: AC | PRN
  Administered 2019-09-01: 3 mL

## 2019-09-01 MED ORDER — BUPIVACAINE HCL 0.25 % IJ SOLN
0.3300 mL | INTRAMUSCULAR | Status: AC | PRN
  Administered 2019-09-01: 18:00:00 .33 mL via INTRA_ARTICULAR

## 2019-09-01 NOTE — Progress Notes (Signed)
Office Visit Note   Patient: Kylie Wheeler           Date of Birth: 1946/02/03           MRN: [id number] Visit Date: 08/31/2019 Requested by: Reynold Bowen, MD [place],  [place] [address] PCP: Reynold Bowen, MD  Subjective: Chief Complaint  Patient presents with  . Right Foot - Pain  . Right Hand - Pain    HPI: Kylie Wheeler is a 74 y.o. female who presents to the office complaining of right foot pain and right thumb pain.  Patient notes 1 to 2 weeks of right foot pain.  She localizes this pain over the medial aspect of the right foot around the first metatarsal.  She notes painful weightbearing and swelling.  She is walking with a limp.  No erythema.  Pain is worse at night.  She has not tried any medications or orthotics.  She has no history of surgery on the right foot or any history of gout.  She has never had this pain before.  She did roll her foot a couple weeks ago but she recalls that it did not cause any pain at time of incident or immediately following..  She also reports right base of  thumb pain.  She denies any injury but notes that the right thumb pain has been ongoing for several weeks.  She is right-hand dominant.  She has difficulty using the right thumb due to pain especially with pinching, gripping, and turning doors/using a can opener.  She denies any history of injury.  She has never had pain like this before.  Denies any triggering of the right thumb.  Has never had surgery on her right thumb. Denies any numbness or tingling in the hand.             ROS:  All systems reviewed are negative as they relate to the chief complaint within the history of present illness.  Patient denies fevers or chills.  Assessment & Plan: Visit Diagnoses:  1. Pain in right foot   2. Pain in right hand   3. Difficulty in weight bearing   4. Arthritis of carpometacarpal [facility]) joint of right thumb     Plan: Patient is a 73 year old female who presents  complaining of right foot and right thumb pain.  As for the right foot she has had pain over the last 2 weeks with no inciting injury.  She is having difficulty weightbearing and walking with a limp.  She does have significant swelling along the dorsal aspect of the medial right foot.  This is predominantly around the tarsometatarsal region.  She is most tender over the midshaft of the first metatarsal and is unable to perform single-leg heel raise.  Suspicious for stress reaction with negative radiographs..  Ordered MRI of the right foot to evaluate for stress fracture.  Radiographs of the right foot taken today show no evidence of fracture or significant degenerative changes of the right foot.  Additionally patient is complaining of right thumb pain.  Radiographs taken today reveal moderate to severe CMC arthritis of the right thumb with subluxation of the base of the proximal metacarpal on the trapezium..  Discussed options available to patient.  After lengthy discussion, patient agreed with a plan consisting of right CMC cortisone injection today.  Under ultrasound guidance an injection was delivered into the [facility] joint successfully and patient tolerated the procedure well.  Plan for patient to follow-up after  right foot MRI to review results.  Follow-Up Instructions: No follow-ups on file.   Orders:  Orders Placed This Encounter  Procedures  . XR Hand Complete Right  . XR Foot Complete Right  . MR Foot Right w/o contrast   No orders of the defined types were placed in this encounter.     Procedures: Small Joint Inj: R thumb CMC on 09/01/2019 6:20 PM Indications: pain Details: 25 G needle, ultrasound-guided radial approach  Spinal Needle: No  Medications: 3 mL lidocaine 1 %; 0.33 mL bupivacaine 0.25 %; 13.33 mg methylPREDNISolone acetate 40 MG/ML Outcome: tolerated well, no immediate complications Procedure, treatment alternatives, risks and benefits explained, specific risks discussed.  Consent was given by the patient. Immediately prior to procedure a time out was called to verify the correct patient, procedure, equipment, support staff and site/side marked as required. Patient was prepped and draped in the usual sterile fashion.       Clinical Data: No additional findings.  Objective: Vital Signs: There were no vitals taken for this visit.  Physical Exam:  Constitutional: Patient appears well-developed HEENT:  Head: Normocephalic Eyes:EOM are normal Neck: Normal range of motion Cardiovascular: Normal rate Pulmonary/chest: Effort normal Neurologic: Patient is alert Skin: Skin is warm Psychiatric: Patient has normal mood and affect  Ortho Exam:  Right foot exam Tender to palpation over the shaft of the first metatarsal of the right foot.  Most tender over the dorsal aspect.  Associated swelling from the midshaft metatarsals to the MTP joints, worse medially.  Unable to perform single-leg heel raise with right foot.  No tenderness to palpation over the fifth metatarsal base, lateral malleolus, medial malleolus, Achilles tendon insertion, retrocalcaneal space, ATFL, CFL, deltoid ligament, peroneal tendon.  Preserved active dorsiflexion and plantarflexion with intact anterior tib and Achilles tendon which are palpable.  Right hand exam Tenderness to palpation over the right CMC joint.  Pain is elicited at this location with adduction of the [facility] joint as well as circumduction.  No significant tenderness over the A1 pulleys of all 5 fingers of the right hand.  Negative Phalen's.  Negative Tinel's.  Compression test positive for CMC crepitus.  MCP joint stable with about 5 degrees less palmar flexion than the left-hand side  Specialty Comments:  No specialty comments available.  Imaging: No results found.   PMFS History: Patient Active Problem List   Diagnosis Date Noted  . Anxiety 12/14/2016  . Lesion of vertebra, T1 09/14/2016  . Overweight 08/25/2016  .  Chronic migraine without aura without status migrainosus, not intractable 05/28/2016  . Cervicogenic headache 03/30/2016  . Nonallopathic lesion of cervical region 03/30/2016  . Nonallopathic lesion of thoracic region 03/30/2016  . Nonallopathic lesion of lumbosacral region 03/30/2016  . Allergic reaction 03/14/2014  . Syncope 11/29/2012  . Vitamin D deficiency 08/14/2010  . OSTEOPOROSIS 08/14/2010  . COLONIC POLYPS, HX OF 08/14/2010  . VERTIGO 08/22/2007  . HYPERLIPIDEMIA 01/12/2007  . INSOMNIA, CHRONIC 01/12/2007  . Headache 01/12/2007   Past Medical History:  Diagnosis Date  . Bladder problem   . Hyperlipidemia   . Insomnia   . Migraines   . Osteoporosis   . Vitamin D deficiency     Family History  Problem Relation Age of Onset  . Stroke Mother   . Atrial fibrillation Mother   . Heart failure Mother   . Stroke Father   . Heart failure Father   . Hyperlipidemia Brother   . Hypertension Brother   . Heart disease  Maternal Grandfather   . Heart disease Paternal Grandmother   . Heart disease Paternal Grandfather   . Hyperlipidemia Sister   . Hyperlipidemia Brother   . Hypertension Brother     Past Surgical History:  Procedure Laterality Date  . ADENOIDECTOMY    . TUBAL LIGATION     Social History   Occupational History  . Occupation: retired-- IT trainer  Tobacco Use  . Smoking status: Never Smoker  . Smokeless tobacco: Never Used  Substance and Sexual Activity  . Alcohol use: No  . Drug use: No  . Sexual activity: Not Currently    Partners: Male    Comment: pt actually said whether she is sexually active or not is none of the govt business

## 2019-09-20 ENCOUNTER — Ambulatory Visit
Admission: RE | Admit: 2019-09-20 | Discharge: 2019-09-20 | Disposition: A | Discharge status: Home / Self Care | Payer: Medicare HMO | Source: Ambulatory Visit | Attending: Orthopedic Surgery | Admitting: Orthopedic Surgery

## 2019-09-20 ENCOUNTER — Other Ambulatory Visit: Payer: Self-pay

## 2019-09-20 DIAGNOSIS — M79671 Pain in right foot: Secondary | ICD-10-CM

## 2019-09-25 ENCOUNTER — Ambulatory Visit: Payer: Medicare HMO | Admitting: Orthopedic Surgery

## 2019-09-25 ENCOUNTER — Other Ambulatory Visit: Payer: Self-pay

## 2019-09-25 ENCOUNTER — Encounter: Payer: Self-pay | Admitting: Orthopedic Surgery

## 2019-09-25 DIAGNOSIS — M84374S Stress fracture, right foot, sequela: Secondary | ICD-10-CM

## 2019-09-26 ENCOUNTER — Encounter: Payer: Self-pay | Admitting: Orthopedic Surgery

## 2019-09-26 NOTE — Progress Notes (Signed)
Office Visit Note   Patient: Kylie Wheeler           Date of Birth: 09-Feb-1946           MRN: [id number] Visit Date: 09/25/2019 Requested by: Reynold Bowen, MD [place],  [place] [address] PCP: Reynold Bowen, MD  Subjective: Chief Complaint  Patient presents with  . Follow-up    HPI: Kylie Wheeler is a patient with right foot pain.  Since have seen her she has had an MRI scan.  Symptoms have been going on for about 4 weeks.  Overall she is improved.  Symptoms started when she was going up and down a lot of stairs in fairly flimsy shoes.  MRI scan does show a stress fracture/reaction at the base of the second metatarsal.  MRI report in the chart is dictated incorrectly.  Review of the scan does show stress reaction and fracture at the base of the second metatarsal with no displacement.              ROS: All systems reviewed are negative as they relate to the chief complaint within the history of present illness.  Patient denies  fevers or chills.   Assessment & Plan: Visit Diagnoses:  1. Stress fracture, right foot, sequela     Plan: Impression is stress reaction right foot base of the second metatarsal with improvement in symptoms.  I think she needs to wear relatively hard soled shoes with good arches.  Some of the flat shoes she has been wearing are not really helping the situation.  She also is reporting some left iliac crest pelvic type pain.  No back pain symptoms and her pain localizes discretely to the lateral iliac crest.  I think that is likely some type of insertional tendinitis and we can work that up later if needed.  Follow-up with me as needed.  If her foot starts to hurt worse then she will need to get a knee scooter or diminish her weightbearing.  I did caution her against doing too much walking barefoot in the house as well as doing stairs because that would put a stress on the midfoot. Follow-Up Instructions: Return if symptoms worsen or fail to improve.    Orders:  No orders of the defined types were placed in this encounter.  No orders of the defined types were placed in this encounter.     Procedures: No procedures performed   Clinical Data: No additional findings.  Objective: Vital Signs: There were no vitals taken for this visit.  Physical Exam:   Constitutional: Patient appears well-developed HEENT:  Head: Normocephalic Eyes:EOM are normal Neck: Normal range of motion Cardiovascular: Normal rate Pulmonary/chest: Effort normal Neurologic: Patient is alert Skin: Skin is warm Psychiatric: Patient has normal mood and affect    Ortho Exam: Ortho exam demonstrates pretty normal gait alignment.  Swelling is diminished in the midfoot region.  Still has some tenderness to palpation primarily along the weightbearing first and second ray region.  Pedal pulses palpable.  Has discrete tenderness to palpation around the lateral iliac crest.  No trochanteric tenderness and no nerve root tension signs on that left-hand side.  Specialty Comments:  No specialty comments available.  Imaging: No results found.   PMFS History: Patient Active Problem List   Diagnosis Date Noted  . Anxiety 12/14/2016  . Lesion of vertebra, T1 09/14/2016  . Overweight 08/25/2016  . Chronic migraine without aura without status migrainosus, not intractable 05/28/2016  . Cervicogenic  headache 03/30/2016  . Nonallopathic lesion of cervical region 03/30/2016  . Nonallopathic lesion of thoracic region 03/30/2016  . Nonallopathic lesion of lumbosacral region 03/30/2016  . Allergic reaction 03/14/2014  . Syncope 11/29/2012  . Vitamin D deficiency 08/14/2010  . OSTEOPOROSIS 08/14/2010  . COLONIC POLYPS, HX OF 08/14/2010  . VERTIGO 08/22/2007  . HYPERLIPIDEMIA 01/12/2007  . INSOMNIA, CHRONIC 01/12/2007  . Headache 01/12/2007   Past Medical History:  Diagnosis Date  . Bladder problem   . Hyperlipidemia   . Insomnia   . Migraines   .  Osteoporosis   . Vitamin D deficiency     Family History  Problem Relation Age of Onset  . Stroke Mother   . Atrial fibrillation Mother   . Heart failure Mother   . Stroke Father   . Heart failure Father   . Hyperlipidemia Brother   . Hypertension Brother   . Heart disease Maternal Grandfather   . Heart disease Paternal Grandmother   . Heart disease Paternal Grandfather   . Hyperlipidemia Sister   . Hyperlipidemia Brother   . Hypertension Brother     Past Surgical History:  Procedure Laterality Date  . ADENOIDECTOMY    . TUBAL LIGATION     Social History   Occupational History  . Occupation: retired-- IT trainer  Tobacco Use  . Smoking status: Never Smoker  . Smokeless tobacco: Never Used  Substance and Sexual Activity  . Alcohol use: No  . Drug use: No  . Sexual activity: Not Currently    Partners: Male    Comment: pt actually said whether she is sexually active or not is none of the govt business

## 2019-10-06 ENCOUNTER — Other Ambulatory Visit: Payer: Medicare HMO

## 2019-11-01 ENCOUNTER — Encounter: Payer: Self-pay | Admitting: Orthopedic Surgery

## 2019-11-01 ENCOUNTER — Ambulatory Visit: Payer: Medicare HMO | Admitting: Orthopedic Surgery

## 2019-11-01 ENCOUNTER — Ambulatory Visit (INDEPENDENT_AMBULATORY_CARE_PROVIDER_SITE_OTHER): Payer: Medicare HMO

## 2019-11-01 ENCOUNTER — Other Ambulatory Visit: Payer: Self-pay

## 2019-11-01 VITALS — Ht 61.0 in | Wt 160.0 lb

## 2019-11-01 DIAGNOSIS — M25559 Pain in unspecified hip: Secondary | ICD-10-CM

## 2019-11-01 DIAGNOSIS — M25552 Pain in left hip: Secondary | ICD-10-CM | POA: Diagnosis not present

## 2019-11-01 MED ORDER — METHOCARBAMOL 500 MG PO TABS: 500.0000 mg | ORAL_TABLET | Freq: Three times a day (TID) | ORAL | 0 refills | Status: DC | PRN

## 2019-11-01 MED ORDER — PREDNISONE 10 MG (21) PO TBPK: ORAL_TABLET | ORAL | 0 refills | Status: DC

## 2019-11-03 ENCOUNTER — Encounter: Payer: Self-pay | Admitting: Orthopedic Surgery

## 2019-11-03 NOTE — Progress Notes (Signed)
Office Visit Note   Patient: Kylie Wheeler           Date of Birth: 1946/02/01           MRN: [id number] Visit Date: 11/01/2019 Requested by: Kylie Bowen, MD [place],  [place] [address] PCP: Kylie Bowen, MD  Subjective: Chief Complaint  Patient presents with  . Left Leg - Pain    HPI: [name] is a 74 year old patient with left hip pain.  Reports pain primarily around the iliac crest region which radiates down the left leg occasionally to the ankle.  She feels like she has shinsplints.  Denies any numbness and tingling.  Onset in April after she planted some flowers.  She does not really take anything for it.  She does have a known history of scoliosis.  Denies any groin pain.  Symptoms are worse with standing.  She is on Lyrica for occipital neuralgia.              ROS: All systems reviewed are negative as they relate to the chief complaint within the history of present illness.  Patient denies  fevers or chills.   Assessment & Plan: Visit Diagnoses:  1. Hip pain     Plan: Impression is left radicular type symptoms with normal hip exam and radiographs.  Plan is Medrol Dosepak and Robaxin.  Even though the patient does not think that this is coming from her back I think with her history of scoliosis normal hip radiographs and exam and some degenerative changes noted on the pelvis x-ray today in the lumbar spine, it is likely to be coming from her back.  If this does not help I think we may need to go with MRI scanning and epidural steroid injections.  No real trochanteric tenderness today.  Follow-up as needed.  Follow-Up Instructions: Return if symptoms worsen or fail to improve.   Orders:  Orders Placed This Encounter  Procedures  . XR HIP UNILAT W OR W/O PELVIS 2-3 VIEWS LEFT   Meds ordered this encounter  Medications  . predniSONE (STERAPRED UNI-PAK 21 TAB) 10 MG (21) TBPK tablet    Sig: Take as directed.    Dispense:  21 tablet    Refill:  0  .  methocarbamol (ROBAXIN) 500 MG tablet    Sig: Take 1 tablet (500 mg total) by mouth every 8 (eight) hours as needed for muscle spasms.    Dispense:  30 tablet    Refill:  0      Procedures: No procedures performed   Clinical Data: No additional findings.  Objective: Vital Signs: Ht 5\' 1"  (1.549 m)   Wt 160 lb (72.6 kg)   BMI 30.23 kg/m   Physical Exam:   Constitutional: Patient appears well-developed HEENT:  Head: Normocephalic Eyes:EOM are normal Neck: Normal range of motion Cardiovascular: Normal rate Pulmonary/chest: Effort normal Neurologic: Patient is alert Skin: Skin is warm Psychiatric: Patient has normal mood and affect    Ortho Exam: Ortho exam demonstrates full active and passive range of motion of the left hip.  Ankle dorsiflexion plantarflexion strength is intact.  No groin pain with internal extra rotation of the leg.  No real trochanteric tenderness left versus right.  Mild pain with forward lateral bending but no nerve root tension signs.  No paresthesias L1 S1 bilaterally.  Reflexes symmetric bilateral patella and Achilles at 0 out of 4.  Negative clonus.  No muscle atrophy in either leg.  Specialty Comments:  No  specialty comments available.  Imaging: No results found.   PMFS History: Patient Active Problem List   Diagnosis Date Noted  . Anxiety 12/14/2016  . Lesion of vertebra, T1 09/14/2016  . Overweight 08/25/2016  . Chronic migraine without aura without status migrainosus, not intractable 05/28/2016  . Cervicogenic headache 03/30/2016  . Nonallopathic lesion of cervical region 03/30/2016  . Nonallopathic lesion of thoracic region 03/30/2016  . Nonallopathic lesion of lumbosacral region 03/30/2016  . Allergic reaction 03/14/2014  . Syncope 11/29/2012  . Vitamin D deficiency 08/14/2010  . OSTEOPOROSIS 08/14/2010  . COLONIC POLYPS, HX OF 08/14/2010  . VERTIGO 08/22/2007  . HYPERLIPIDEMIA 01/12/2007  . INSOMNIA, CHRONIC 01/12/2007  .  Headache 01/12/2007   Past Medical History:  Diagnosis Date  . Bladder problem   . Hyperlipidemia   . Insomnia   . Migraines   . Osteoporosis   . Vitamin D deficiency     Family History  Problem Relation Age of Onset  . Stroke Mother   . Atrial fibrillation Mother   . Heart failure Mother   . Stroke Father   . Heart failure Father   . Hyperlipidemia Brother   . Hypertension Brother   . Heart disease Maternal Grandfather   . Heart disease Paternal Grandmother   . Heart disease Paternal Grandfather   . Hyperlipidemia Sister   . Hyperlipidemia Brother   . Hypertension Brother     Past Surgical History:  Procedure Laterality Date  . ADENOIDECTOMY    . TUBAL LIGATION     Social History   Occupational History  . Occupation: retired-- IT trainer  Tobacco Use  . Smoking status: Never Smoker  . Smokeless tobacco: Never Used  Substance and Sexual Activity  . Alcohol use: No  . Drug use: No  . Sexual activity: Not Currently    Partners: Male    Comment: pt actually said whether she is sexually active or not is none of the govt business

## 2019-11-05 ENCOUNTER — Encounter: Payer: Self-pay | Admitting: Orthopedic Surgery

## 2019-11-07 ENCOUNTER — Other Ambulatory Visit: Payer: Self-pay | Admitting: Surgical

## 2019-11-07 MED ORDER — MELOXICAM 15 MG PO TABS: 15.0000 mg | ORAL_TABLET | Freq: Every day | ORAL | 0 refills | Status: DC | PRN

## 2020-02-28 ENCOUNTER — Ambulatory Visit: Payer: Self-pay

## 2020-02-28 ENCOUNTER — Ambulatory Visit: Payer: Medicare HMO | Admitting: Orthopedic Surgery

## 2020-02-28 ENCOUNTER — Telehealth: Payer: Self-pay

## 2020-02-28 DIAGNOSIS — M79605 Pain in left leg: Secondary | ICD-10-CM

## 2020-02-28 DIAGNOSIS — M79602 Pain in left arm: Secondary | ICD-10-CM

## 2020-02-28 NOTE — Telephone Encounter (Signed)
Patient being scheduled for MRI and requesting valium. Can you please submit. Per Dr Marlou Sa 10mg  po 30 min prior to procedure

## 2020-03-01 ENCOUNTER — Other Ambulatory Visit: Payer: Self-pay | Admitting: Surgical

## 2020-03-01 MED ORDER — DIAZEPAM 5 MG PO TABS: 10.0000 mg | ORAL_TABLET | Freq: Once | ORAL | 0 refills | Status: AC

## 2020-03-01 NOTE — Telephone Encounter (Signed)
Submitted RX 

## 2020-03-04 ENCOUNTER — Encounter: Payer: Self-pay | Admitting: Orthopedic Surgery

## 2020-03-04 NOTE — Progress Notes (Signed)
Office Visit Note   Patient: Kylie Wheeler           Date of Birth: [birth year]           MRN: [id number] Visit Date: 02/28/2020 Requested by: Reynold Bowen, MD [place],  [place] [address] PCP: Reynold Bowen, MD  Subjective: Chief Complaint  Patient presents with  . Left Shoulder - Pain  . Left Hip - Pain    HPI: Kylie Wheeler is a 74 y.o. female who presents to the office complaining of left shoulder pain and left hip pain.  Patient denies any history of injury.  She complains of superior shoulder pain that radiates into her neck.  It also radiates up into her head.  This pain is worse with turning her head.  She has a history of occipital neuralgia.  She denies any numbness or tingling or radicular pain down her arm.  Patient also complains of left hip pain.  Localizes pain around the iliac crest.  Denies any low back pain.  She does note groin pain as well.  Pain does not wake her up at night.  Not worse with sneezing or coughing.  No subjective weakness of the lower legs.  Laying down helps with her pain but sitting causes worsening of her hip pain.  Denies any history of hip or back surgery.  She does note that her left hip tingles been burned when she walks at times.              ROS: All systems reviewed are negative as they relate to the chief complaint within the history of present illness.  Patient denies fevers or chills.  Assessment & Plan: Visit Diagnoses:  1. Left arm pain   2. Pain in left leg     Plan: Patient is a 74 year old female who presents complaining of left hip pain and left shoulder pain.  Left hip pain is her main complaint.  She notes ongoing pain that she localizes to the iliac crest.  She has burning pain that tingles and is worse with walking.  Radiographs of the left hip are normal according to x-rays taken back in May.  Patient's complaints are somewhat atypical but impression is that her pain is radicular and stemming from the  lumbar spine.  Ordered MRI of the lumbar spine for further evaluation.  Follow-up after MRI to review results.  Shoulder pain can be worked up at a later date.  Radiographically she looks okay and on exam nothing really jumps out in terms of overt rotator cuff weakness  Follow-Up Instructions: No follow-ups on file.   Orders:  Orders Placed This Encounter  Procedures  . XR Shoulder Left  . XR Cervical Spine 2 or 3 views  . MR Lumbar Spine w/o contrast   No orders of the defined types were placed in this encounter.     Procedures: No procedures performed   Clinical Data: No additional findings.  Objective: Vital Signs: There were no vitals taken for this visit.  Physical Exam:  Constitutional: Patient appears well-developed HEENT:  Head: Normocephalic Eyes:EOM are normal Neck: Normal range of motion Cardiovascular: Normal rate Pulmonary/chest: Effort normal Neurologic: Patient is alert Skin: Skin is warm Psychiatric: Patient has normal mood and affect  Ortho Exam: Ortho exam demonstrates left shoulder with preserved range of motion.  Excellent strength with rotator cuff.  No tenderness over the [facility] joint.  Mild tenderness over the axial cervical spine.  Mild tenderness over  the left trapezius muscle.  No significant tenderness over the axial lumbar spine.  Tenderness over the left-sided paraspinal musculature as well as the left iliac crest diffusely.  No pain with hip range of motion of the left hip.  Negative Stinchfield exam.  Negative straight leg raise.  5/5 motor strength of the bilateral hip flexors, quadriceps, hamstring, dorsiflexion, plantarflexion.  Specialty Comments:  No specialty comments available.  Imaging: No results found.   PMFS History: Patient Active Problem List   Diagnosis Date Noted  . Anxiety 12/14/2016  . Lesion of vertebra, T1 09/14/2016  . Overweight 08/25/2016  . Chronic migraine without aura without status migrainosus, not intractable  05/28/2016  . Cervicogenic headache 03/30/2016  . Nonallopathic lesion of cervical region 03/30/2016  . Nonallopathic lesion of thoracic region 03/30/2016  . Nonallopathic lesion of lumbosacral region 03/30/2016  . Allergic reaction 03/14/2014  . Syncope 11/29/2012  . Vitamin D deficiency 08/14/2010  . OSTEOPOROSIS 08/14/2010  . COLONIC POLYPS, HX OF 08/14/2010  . VERTIGO 08/22/2007  . HYPERLIPIDEMIA 01/12/2007  . INSOMNIA, CHRONIC 01/12/2007  . Headache 01/12/2007   Past Medical History:  Diagnosis Date  . Bladder problem   . Hyperlipidemia   . Insomnia   . Migraines   . Osteoporosis   . Vitamin D deficiency     Family History  Problem Relation Age of Onset  . Stroke Mother   . Atrial fibrillation Mother   . Heart failure Mother   . Stroke Father   . Heart failure Father   . Hyperlipidemia Brother   . Hypertension Brother   . Heart disease Maternal Grandfather   . Heart disease Paternal Grandmother   . Heart disease Paternal Grandfather   . Hyperlipidemia Sister   . Hyperlipidemia Brother   . Hypertension Brother     Past Surgical History:  Procedure Laterality Date  . ADENOIDECTOMY    . TUBAL LIGATION     Social History   Occupational History  . Occupation: retired-- IT trainer  Tobacco Use  . Smoking status: Never Smoker  . Smokeless tobacco: Never Used  Vaping Use  . Vaping Use: Never used  Substance and Sexual Activity  . Alcohol use: No  . Drug use: No  . Sexual activity: Not Currently    Partners: Male    Comment: pt actually said whether she is sexually active or not is none of the govt business

## 2020-03-21 ENCOUNTER — Other Ambulatory Visit: Payer: Medicare HMO

## 2020-03-22 ENCOUNTER — Encounter: Payer: Self-pay | Admitting: *Deleted

## 2020-04-08 ENCOUNTER — Encounter: Payer: Self-pay | Admitting: Orthopedic Surgery

## 2020-04-11 NOTE — Telephone Encounter (Signed)
I not remember talk about this 1 can you ask the brain what the reason was behind the denial thanks

## 2020-04-16 ENCOUNTER — Other Ambulatory Visit: Payer: Self-pay | Admitting: Cardiology

## 2020-04-16 DIAGNOSIS — E7800 Pure hypercholesterolemia, unspecified: Secondary | ICD-10-CM

## 2020-04-18 ENCOUNTER — Ambulatory Visit (INDEPENDENT_AMBULATORY_CARE_PROVIDER_SITE_OTHER): Payer: Medicare HMO

## 2020-04-18 ENCOUNTER — Ambulatory Visit: Payer: Medicare HMO | Admitting: Orthopedic Surgery

## 2020-04-18 DIAGNOSIS — M79671 Pain in right foot: Secondary | ICD-10-CM

## 2020-04-18 DIAGNOSIS — M205X2 Other deformities of toe(s) (acquired), left foot: Secondary | ICD-10-CM | POA: Diagnosis not present

## 2020-04-18 DIAGNOSIS — M21612 Bunion of left foot: Secondary | ICD-10-CM | POA: Diagnosis not present

## 2020-04-23 ENCOUNTER — Encounter: Payer: Self-pay | Admitting: Orthopedic Surgery

## 2020-04-23 NOTE — Progress Notes (Signed)
Office Visit Note   Patient: Kylie Wheeler           Date of Birth: February 01, 1946           MRN: [id number] Visit Date: 04/18/2020              Requested by: Reynold Bowen, [place],  [place] [address] PCP: Reynold Bowen, MD  Chief Complaint  Patient presents with  . Left Foot - Pain  . Right Foot - Pain      HPI: Patient is a 74 year old woman who is seen in referral from Dr. Marlou Sa.  Patient had a blunt trauma to the right great toe when she slipped on the floor she is concerned she may have sustained a fracture.  Patient complains of pain from her left great toe bunion as well as pain beneath the metatarsal heads.  Assessment & Plan: Visit Diagnoses:  1. Pain in right foot   2. Bunion of great toe of left foot   3. Claw toe, acquired, left     Plan: Recommended a stiff soled shoe to unload pressure from the MTP joint of the right foot she had a hyperextension injury.  No evidence of a plantar plate disruption.  Patient states she would like to proceed with surgery in January for the left great toe bunion and clawing of the lesser toes.  Discussed that we could proceed with a chevron osteotomy for the first metatarsal and Weil osteotomy for the second third and fourth metatarsals.  Risk and benefits were discussed including infection neurovascular injury persistent pain need for additional surgery.  Patient states she will call for surgery in January.  Follow-Up Instructions: No follow-ups on file.   Ortho Exam  Patient is alert, oriented, no adenopathy, well-dressed, normal affect, normal respiratory effort. Examination patient has a good dorsalis pedis pulse bilaterally.  There is no ecchymosis or bruising around the great toe extension of the great toe was painful radiographs shows no fractures.  The sesamoids are nontender to palpation.  Examination the left foot she does have redness over her bunion which she states she has tried different shoewear without  relief.  She has clawing of the lesser toes with a long second third and fourth metatarsal with a short first metatarsal.  Imaging: No results found. No images are attached to the encounter.  Labs: Lab Results  Component Value Date   GRAMSTAIN Rare 03/14/2014   GRAMSTAIN WBC present-predominately PMN 03/14/2014   GRAMSTAIN No Squamous Epithelial Cells Seen 03/14/2014   GRAMSTAIN No Organisms Seen 03/14/2014   LABORGA NO GROWTH 2 DAYS 03/14/2014     Lab Results  Component Value Date   ALBUMIN 4.6 04/04/2019   ALBUMIN 4.5 07/31/2014   ALBUMIN 4.1 10/02/2012    No results found for: MG Lab Results  Component Value Date   VD25OH 23 (L) 08/14/2010    No results found for: PREALBUMIN CBC EXTENDED Latest Ref Rng & Units 07/31/2014 10/02/2012 10/02/2012  WBC 4.0 - 10.5 K/uL 8.0 5.9 -  RBC 3.87 - 5.11 MIL/uL 4.48 4.66 -  HGB 12.0 - 15.0 g/dL 13.9 14.6 15.3(H)  HCT 36 - 46 % 42.5 41.3 45.0  PLT 150 - 400 K/uL 208 204 -  NEUTROABS 1.7 - 7.7 K/uL 6.5 - -  LYMPHSABS 0.7 - 4.0 K/uL 0.8 - -     There is no height or weight on file to calculate BMI.  Orders:  Orders Placed This Encounter  Procedures  .  XR Foot 2 Views Left  . XR Toe Great Right   No orders of the defined types were placed in this encounter.    Procedures: No procedures performed  Clinical Data: No additional findings.  ROS:  All other systems negative, except as noted in the HPI. Review of Systems  Objective: Vital Signs: There were no vitals taken for this visit.  Specialty Comments:  No specialty comments available.  PMFS History: Patient Active Problem List   Diagnosis Date Noted  . Anxiety 12/14/2016  . Lesion of vertebra, T1 09/14/2016  . Overweight 08/25/2016  . Chronic migraine without aura without status migrainosus, not intractable 05/28/2016  . Cervicogenic headache 03/30/2016  . Nonallopathic lesion of cervical region 03/30/2016  . Nonallopathic lesion of thoracic region  03/30/2016  . Nonallopathic lesion of lumbosacral region 03/30/2016  . Allergic reaction 03/14/2014  . Syncope 11/29/2012  . Vitamin D deficiency 08/14/2010  . OSTEOPOROSIS 08/14/2010  . COLONIC POLYPS, HX OF 08/14/2010  . VERTIGO 08/22/2007  . HYPERLIPIDEMIA 01/12/2007  . INSOMNIA, CHRONIC 01/12/2007  . Headache 01/12/2007   Past Medical History:  Diagnosis Date  . Bladder problem   . Hyperlipidemia   . Insomnia   . Migraines   . Osteoporosis   . Vitamin D deficiency     Family History  Problem Relation Age of Onset  . Stroke Mother   . Atrial fibrillation Mother   . Heart failure Mother   . Stroke Father   . Heart failure Father   . Hyperlipidemia Brother   . Hypertension Brother   . Heart disease Maternal Grandfather   . Heart disease Paternal Grandmother   . Heart disease Paternal Grandfather   . Hyperlipidemia Sister   . Hyperlipidemia Brother   . Hypertension Brother     Past Surgical History:  Procedure Laterality Date  . ADENOIDECTOMY    . TUBAL LIGATION     Social History   Occupational History  . Occupation: retired-- IT trainer  Tobacco Use  . Smoking status: Never Smoker  . Smokeless tobacco: Never Used  Vaping Use  . Vaping Use: Never used  Substance and Sexual Activity  . Alcohol use: No  . Drug use: No  . Sexual activity: Not Currently    Partners: Male    Comment: pt actually said whether she is sexually active or not is none of the govt business

## 2020-04-24 ENCOUNTER — Ambulatory Visit: Payer: Medicare HMO | Admitting: Orthopedic Surgery

## 2020-04-24 DIAGNOSIS — M1811 Unilateral primary osteoarthritis of first carpometacarpal joint, right hand: Secondary | ICD-10-CM

## 2020-04-27 ENCOUNTER — Encounter: Payer: Self-pay | Admitting: Orthopedic Surgery

## 2020-04-27 DIAGNOSIS — M1811 Unilateral primary osteoarthritis of first carpometacarpal joint, right hand: Secondary | ICD-10-CM | POA: Diagnosis not present

## 2020-04-27 MED ORDER — LIDOCAINE HCL 1 % IJ SOLN
3.0000 mL | INTRAMUSCULAR | Status: AC | PRN
  Administered 2020-04-27: 3 mL

## 2020-04-27 MED ORDER — METHYLPREDNISOLONE ACETATE 40 MG/ML IJ SUSP
13.3300 mg | INTRAMUSCULAR | Status: AC | PRN
  Administered 2020-04-27: 13.33 mg via INTRA_ARTICULAR

## 2020-04-27 MED ORDER — BUPIVACAINE HCL 0.25 % IJ SOLN
0.3300 mL | INTRAMUSCULAR | Status: AC | PRN
  Administered 2020-04-27: .33 mL via INTRA_ARTICULAR

## 2020-04-27 NOTE — Progress Notes (Signed)
Office Visit Note   Patient: Kylie Wheeler           Date of Birth: Jan 20, 1946           MRN: [id number] Visit Date: 04/24/2020 Requested by: Reynold Bowen, MD [place],  [place] [address] PCP: Reynold Bowen, MD  Subjective: Chief Complaint  Patient presents with  . follow up    DGL:OVFI is a 74 year old patient with right hand pain as well as left back and leg pain.  She has had symptoms now for 5 months.  Reports pain in the iliac crest region which radiates down to the front of the leg.  Hip work-up on the left-hand side was negative for arthritis.  She has been doing home exercise program from physical therapist [facility] for least 6 weeks with no improvement in symptoms.  She is also been taking anti-inflammatories with no improvement.  Is a burning pain which is similar to shinsplints.  Worse with bending.  Hurts for her to lift anything.  Rotation also hurts.  She has pain with ADLs.  She does have a history of scoliosis which also is consistent with her presentation of symptoms.  Symptoms have been going on for least 5 months.  She is done at least 8 weeks of physical therapy directed exercises and has taken Mobic for at least 6 weeks with no improvement.  She also describes right hand and thumb pain with known history of CMC arthritis.              ROS: All systems reviewed are negative as they relate to the chief complaint within the history of present illness.  Patient denies  fevers or chills.   Assessment & Plan: Visit Diagnoses:  1. Arthritis of carpometacarpal (CMC) joint of right thumb     Plan: Impression is CMC arthritis and low back and hip pain on the left-hand side.  Plan is ultrasound-guided injection to the [facility] joint with MRI scan of the lumbar spine due to likely significant facet arthritis which I think could respond well to ESI's and failure of exhaustive course of conservative management.  Plan to review the MRI scan and get her set up to see  Dr. Ernestina Patches for ESI's of the lumbar spine once we know the appropriate levels.  Follow-Up Instructions: Return for after MRI.   Orders:  No orders of the defined types were placed in this encounter.  No orders of the defined types were placed in this encounter.     Procedures: Small Joint Inj: R thumb CMC on 04/27/2020 11:21 PM Indications: pain Details: 25 G needle, ultrasound-guided radial approach  Spinal Needle: No  Medications: 3 mL lidocaine 1 %; 0.33 mL bupivacaine 0.25 %; 13.33 mg methylPREDNISolone acetate 40 MG/ML Outcome: tolerated well, no immediate complications Procedure, treatment alternatives, risks and benefits explained, specific risks discussed. Consent was given by the patient. Immediately prior to procedure a time out was called to verify the correct patient, procedure, equipment, support staff and site/side marked as required. Patient was prepped and draped in the usual sterile fashion.       Clinical Data: No additional findings.  Objective: Vital Signs: There were no vitals taken for this visit.  Physical Exam:   Constitutional: Patient appears well-developed HEENT:  Head: Normocephalic Eyes:EOM are normal Neck: Normal range of motion Cardiovascular: Normal rate Pulmonary/chest: Effort normal Neurologic: Patient is alert Skin: Skin is warm Psychiatric: Patient has normal mood and affect    Ortho Exam:  Ortho exam demonstrates normal gait alignment.  Palpable pedal pulses.  Equivocal nerve root tension signs on the left negative on the right.  No groin pain with internal X rotation on the left-hand side.  No masses lymphadenopathy or skin changes noted in that left hip region.  Right hand demonstrates positive grind test with intact EPL FPL function.  Specialty Comments:  No specialty comments available.  Imaging: No results found.   PMFS History: Patient Active Problem List   Diagnosis Date Noted  . Anxiety 12/14/2016  . Lesion of  vertebra, T1 09/14/2016  . Overweight 08/25/2016  . Chronic migraine without aura without status migrainosus, not intractable 05/28/2016  . Cervicogenic headache 03/30/2016  . Nonallopathic lesion of cervical region 03/30/2016  . Nonallopathic lesion of thoracic region 03/30/2016  . Nonallopathic lesion of lumbosacral region 03/30/2016  . Allergic reaction 03/14/2014  . Syncope 11/29/2012  . Vitamin D deficiency 08/14/2010  . OSTEOPOROSIS 08/14/2010  . COLONIC POLYPS, HX OF 08/14/2010  . VERTIGO 08/22/2007  . HYPERLIPIDEMIA 01/12/2007  . INSOMNIA, CHRONIC 01/12/2007  . Headache 01/12/2007   Past Medical History:  Diagnosis Date  . Bladder problem   . Hyperlipidemia   . Insomnia   . Migraines   . Osteoporosis   . Vitamin D deficiency     Family History  Problem Relation Age of Onset  . Stroke Mother   . Atrial fibrillation Mother   . Heart failure Mother   . Stroke Father   . Heart failure Father   . Hyperlipidemia Brother   . Hypertension Brother   . Heart disease Maternal Grandfather   . Heart disease Paternal Grandmother   . Heart disease Paternal Grandfather   . Hyperlipidemia Sister   . Hyperlipidemia Brother   . Hypertension Brother     Past Surgical History:  Procedure Laterality Date  . ADENOIDECTOMY    . TUBAL LIGATION     Social History   Occupational History  . Occupation: retired-- IT trainer  Tobacco Use  . Smoking status: Never Smoker  . Smokeless tobacco: Never Used  Vaping Use  . Vaping Use: Never used  Substance and Sexual Activity  . Alcohol use: No  . Drug use: No  . Sexual activity: Not Currently    Partners: Male    Comment: pt actually said whether she is sexually active or not is none of the govt business

## 2020-05-02 ENCOUNTER — Other Ambulatory Visit: Payer: Self-pay

## 2020-05-02 ENCOUNTER — Telehealth: Payer: Self-pay

## 2020-05-02 DIAGNOSIS — M79605 Pain in left leg: Secondary | ICD-10-CM

## 2020-05-02 NOTE — Telephone Encounter (Signed)
Patient called she is waiting for MRI to be scheduled for her lower spine, to her understanding Dr.Dean was supposed to send a new referral to the imaging place 04/24/2020.Patient would like a call back today.. [phone]

## 2020-05-02 NOTE — Telephone Encounter (Signed)
I have put in a new order for scan. Will you please resubmit to insurance to see if we can get it approved? I have notified patient.

## 2020-05-21 ENCOUNTER — Other Ambulatory Visit: Payer: Self-pay | Admitting: Orthopedic Surgery

## 2020-05-25 ENCOUNTER — Ambulatory Visit
Admission: RE | Admit: 2020-05-25 | Discharge: 2020-05-25 | Disposition: A | Discharge status: Home / Self Care | Payer: Medicare HMO | Source: Ambulatory Visit | Attending: Orthopedic Surgery | Admitting: Orthopedic Surgery

## 2020-05-25 ENCOUNTER — Other Ambulatory Visit: Payer: Self-pay

## 2020-05-25 DIAGNOSIS — M79605 Pain in left leg: Secondary | ICD-10-CM

## 2020-05-28 NOTE — Progress Notes (Signed)
Just talk to the patient about her scan.  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  She is symptomatic at this level.  Can you set her up to see Dr. Ernestina Patches for an injection on the left-hand side at the L4-5 foraminal region thanks

## 2020-05-30 ENCOUNTER — Other Ambulatory Visit: Payer: Self-pay

## 2020-05-30 DIAGNOSIS — M79605 Pain in left leg: Secondary | ICD-10-CM

## 2020-06-21 DIAGNOSIS — M542 Cervicalgia: Secondary | ICD-10-CM | POA: Diagnosis not present

## 2020-06-21 DIAGNOSIS — G43719 Chronic migraine without aura, intractable, without status migrainosus: Secondary | ICD-10-CM | POA: Diagnosis not present

## 2020-06-21 DIAGNOSIS — G518 Other disorders of facial nerve: Secondary | ICD-10-CM | POA: Diagnosis not present

## 2020-06-21 DIAGNOSIS — M791 Myalgia, unspecified site: Secondary | ICD-10-CM | POA: Diagnosis not present

## 2020-06-25 ENCOUNTER — Other Ambulatory Visit: Payer: Self-pay

## 2020-06-25 DIAGNOSIS — E7800 Pure hypercholesterolemia, unspecified: Secondary | ICD-10-CM

## 2020-06-26 ENCOUNTER — Other Ambulatory Visit: Payer: Self-pay

## 2020-06-26 ENCOUNTER — Ambulatory Visit: Payer: Medicare HMO | Admitting: Orthopedic Surgery

## 2020-06-26 DIAGNOSIS — M21612 Bunion of left foot: Secondary | ICD-10-CM | POA: Diagnosis not present

## 2020-06-29 ENCOUNTER — Encounter: Payer: Self-pay | Admitting: Orthopedic Surgery

## 2020-06-29 NOTE — Progress Notes (Signed)
Office Visit Note   Patient: Kylie Wheeler           Date of Birth: 1946-01-26           MRN: [id number] Visit Date: 06/26/2020 Requested by: Reynold Bowen, MD [place],  [place] [id number] PCP: Reynold Bowen, MD  Subjective: Chief Complaint  Patient presents with  . Left Foot - Pain    HPI: Kylie Wheeler is a patient with left foot pain.  She reports pain in the plantar midportion of her foot primarily along the first ray region.  Pain has been ongoing but getting worse.  Denies any history of injury.  Reports increased pain with standing or ambulation.  No history of gout.  Not currently taking any medications.  Radiographs from November show hallux valgus deformity.  She is scheduled for surgery for that in March with Dr. Sharol Given.              ROS: All systems reviewed are negative as they relate to the chief complaint within the history of present illness.  Patient denies  fevers or chills.   Assessment & Plan: Visit Diagnoses:  1. Bunion of great toe of left foot     Plan: Impression is left foot pain.  She does have some hammertoes and bunion of the left foot.  Plan is no intervention for now.  I think she may have a component of insertional posterior tib tendinitis versus referred pain from nerve stretching from the bunion versus stress reaction in the first ray.  She does get pretty good relief by wearing arch supports and appropriate shoes.  No further intervention required at this time.  Hopefully the bunion surgery will take care of these issues.  Follow-up with me as needed.  Follow-Up Instructions: Return if symptoms worsen or fail to improve.   Orders:  No orders of the defined types were placed in this encounter.  No orders of the defined types were placed in this encounter.     Procedures: No procedures performed   Clinical Data: No additional findings.  Objective: Vital Signs: There were no vitals taken for this visit.  Physical Exam:    Constitutional: Patient appears well-developed HEENT:  Head: Normocephalic Eyes:EOM are normal Neck: Normal range of motion Cardiovascular: Normal rate Pulmonary/chest: Effort normal Neurologic: Patient is alert Skin: Skin is warm Psychiatric: Patient has normal mood and affect    Ortho Exam: Ortho exam demonstrates full active and passive range of motion of the ankle.  Patient has palpable intact nontender anterior to posterior to peroneal and Achilles tendon on the left-hand side.  Pedal pulses palpable.  Patient does have tenderness over a moderate hallux valgus deformity of the first MTP joint.  Toe dorsiflexion plantarflexion intact.  No masses lymphadenopathy or skin changes noted in that left foot region.  Specialty Comments:  No specialty comments available.  Imaging: No results found.   PMFS History: Patient Active Problem List   Diagnosis Date Noted  . Anxiety 12/14/2016  . Lesion of vertebra, T1 09/14/2016  . Overweight 08/25/2016  . Chronic migraine without aura without status migrainosus, not intractable 05/28/2016  . Cervicogenic headache 03/30/2016  . Nonallopathic lesion of cervical region 03/30/2016  . Nonallopathic lesion of thoracic region 03/30/2016  . Nonallopathic lesion of lumbosacral region 03/30/2016  . Allergic reaction 03/14/2014  . Syncope 11/29/2012  . Vitamin D deficiency 08/14/2010  . OSTEOPOROSIS 08/14/2010  . COLONIC POLYPS, HX OF 08/14/2010  . VERTIGO 08/22/2007  .  HYPERLIPIDEMIA 01/12/2007  . INSOMNIA, CHRONIC 01/12/2007  . Headache 01/12/2007   Past Medical History:  Diagnosis Date  . Bladder problem   . Hyperlipidemia   . Insomnia   . Migraines   . Osteoporosis   . Vitamin D deficiency     Family History  Problem Relation Age of Onset  . Stroke Mother   . Atrial fibrillation Mother   . Heart failure Mother   . Stroke Father   . Heart failure Father   . Hyperlipidemia Brother   . Hypertension Brother   . Heart  disease Maternal Grandfather   . Heart disease Paternal Grandmother   . Heart disease Paternal Grandfather   . Hyperlipidemia Sister   . Hyperlipidemia Brother   . Hypertension Brother     Past Surgical History:  Procedure Laterality Date  . ADENOIDECTOMY    . TUBAL LIGATION     Social History   Occupational History  . Occupation: retired-- IT trainer  Tobacco Use  . Smoking status: Never Smoker  . Smokeless tobacco: Never Used  Vaping Use  . Vaping Use: Never used  Substance and Sexual Activity  . Alcohol use: No  . Drug use: No  . Sexual activity: Not Currently    Partners: Male    Comment: pt actually said whether she is sexually active or not is none of the govt business

## 2020-07-02 ENCOUNTER — Other Ambulatory Visit: Payer: Self-pay

## 2020-07-02 ENCOUNTER — Ambulatory Visit: Payer: Self-pay

## 2020-07-02 ENCOUNTER — Ambulatory Visit: Payer: Medicare HMO | Admitting: Physical Medicine and Rehabilitation

## 2020-07-02 ENCOUNTER — Encounter: Payer: Self-pay | Admitting: Physical Medicine and Rehabilitation

## 2020-07-02 VITALS — BP 142/84 | HR 70

## 2020-07-02 DIAGNOSIS — M5416 Radiculopathy, lumbar region: Secondary | ICD-10-CM | POA: Diagnosis not present

## 2020-07-02 DIAGNOSIS — M48061 Spinal stenosis, lumbar region without neurogenic claudication: Secondary | ICD-10-CM

## 2020-07-02 DIAGNOSIS — M4125 Other idiopathic scoliosis, thoracolumbar region: Secondary | ICD-10-CM

## 2020-07-02 MED ORDER — METHYLPREDNISOLONE ACETATE 80 MG/ML IJ SUSP
80.0000 mg | Freq: Once | INTRAMUSCULAR | Status: AC
  Administered 2020-07-02: 80 mg

## 2020-07-02 NOTE — Progress Notes (Signed)
Pt state lower back pain that travels to her left hip down her left leg. Pt state bending and standing makes the pain worse. Pt state she sits to rest to ease the pain.  Numeric Pain Rating Scale and Functional Assessment Average Pain 2   In the last MONTH (on 0-10 scale) has pain interfered with the following?  1. General activity like being  able to carry out your everyday physical activities such as walking, climbing stairs, carrying groceries, or moving a chair?  Rating(8)   +Driver, -BT, -Dye Allergies.

## 2020-07-02 NOTE — Patient Instructions (Signed)

## 2020-07-03 NOTE — Procedures (Signed)
Lumbosacral Transforaminal Epidural Steroid Injection - Sub-Pedicular Approach with Fluoroscopic Guidance  Patient: Kylie Wheeler      Date of Birth: 01-22-1946 MRN: [id number] PCP: Reynold Bowen, MD      Visit Date: 07/02/2020   Universal Protocol:    Date/Time: 07/02/2020  Consent Given By: the patient  Position: PRONE  Additional Comments: Vital signs were monitored before and after the procedure. Patient was prepped and draped in the usual sterile fashion. The correct patient, procedure, and site was verified.   Injection Procedure Details:   Procedure diagnoses: Lumbar radiculopathy [M54.16]    Meds Administered:  Meds ordered this encounter  Medications  . methylPREDNISolone acetate (DEPO-MEDROL) injection 80 mg    Laterality: Left  Location/Site:  L4-L5  Needle:5.0 in., 25 ga.  Short bevel or Quincke spinal needle  Needle Placement: Transforaminal  Findings:    -Comments: Excellent flow of contrast along the nerve, nerve root and into the epidural space. Due to foraminal narrowing patient did have some temporary pain with needle entry but no parasthesia  Procedure Details: After squaring off the end-plates to get a true AP view, the C-arm was positioned so that an oblique view of the foramen as noted above was visualized. The target area is just inferior to the "nose of the scotty dog" or sub pedicular. The soft tissues overlying this structure were infiltrated with 2-3 ml. of 1% Lidocaine without Epinephrine.  The spinal needle was inserted toward the target using a "trajectory" view along the fluoroscope beam.  Under AP and lateral visualization, the needle was advanced so it did not puncture dura and was located close the 6 O'Clock position of the pedical in AP tracterory. Biplanar projections were used to confirm position. Aspiration was confirmed to be negative for CSF and/or blood. A 1-2 ml. volume of Isovue-250 was injected and flow of contrast was  noted at each level. Radiographs were obtained for documentation purposes.   After attaining the desired flow of contrast documented above, a 0.5 to 1.0 ml test dose of 0.25% Marcaine was injected into each respective transforaminal space.  The patient was observed for 90 seconds post injection.  After no sensory deficits were reported, and normal lower extremity motor function was noted,   the above injectate was administered so that equal amounts of the injectate were placed at each foramen (level) into the transforaminal epidural space.   Additional Comments:  The patient tolerated the procedure well Dressing: 2 x 2 sterile gauze and Band-Aid    Post-procedure details: Patient was observed during the procedure. Post-procedure instructions were reviewed.  Patient left the clinic in stable condition.

## 2020-07-03 NOTE — Progress Notes (Addendum)
Kylie Wheeler - 75 y.o. female MRN [id number]  Date of birth: 03-Jan-1946  Office Visit Note: Visit Date: 07/02/2020 PCP: Reynold Bowen, MD Referred by: Reynold Bowen, MD  Subjective: Chief Complaint  Patient presents with  . Lower Back - Pain  . Left Leg - Pain  . Left Hip - Pain   HPI:  Kylie Wheeler is a 75 y.o. female who comes in today at the request of Dr. Anderson Malta for planned Left L4-L5 Lumbar epidural steroid injection with fluoroscopic guidance.  The patient has failed conservative care including home exercise, medications, time and activity modification.  This injection will be diagnostic and hopefully therapeutic.  Please see requesting physician notes for further details and justification.  MRI reviewed with images and spine model.  MRI reviewed in the note below.  Theycontinue with home exercises.  Current medication management is not beneficial in increasing their functional status.  Procedures are done as part of a comprehensive orthopedic and pain management program with access to in-house orthopedics, spine surgery and physical therapy as well as access to [facility]opsychosocial counseling if needed.   ROS Otherwise per HPI.  Assessment & Plan: Visit Diagnoses:    ICD-10-CM   1. Lumbar radiculopathy  M54.16 XR C-ARM NO REPORT    Epidural Steroid injection    methylPREDNISolone acetate (DEPO-MEDROL) injection 80 mg  2. Foraminal stenosis of lumbar region  M48.061   3. Bilateral stenosis of lateral recess of lumbar spine  M48.061   4. Other idiopathic scoliosis, thoracolumbar region  M41.25     Plan: No additional findings.   Meds & Orders:  Meds ordered this encounter  Medications  . methylPREDNISolone acetate (DEPO-MEDROL) injection 80 mg    Orders Placed This Encounter  Procedures  . XR C-ARM NO REPORT  . Epidural Steroid injection    Follow-up: Return if symptoms worsen or fail to improve, for Consider interlaminar  injection.   Procedures: No procedures performed  Lumbosacral Transforaminal Epidural Steroid Injection - Sub-Pedicular Approach with Fluoroscopic Guidance  Patient: Kylie Wheeler      Date of Birth: Jan 26, 1946 MRN: [id number] PCP: Reynold Bowen, MD      Visit Date: 07/02/2020   Universal Protocol:    Date/Time: 07/02/2020  Consent Given By: the patient  Position: PRONE  Additional Comments: Vital signs were monitored before and after the procedure. Patient was prepped and draped in the usual sterile fashion. The correct patient, procedure, and site was verified.   Injection Procedure Details:   Procedure diagnoses: Lumbar radiculopathy [M54.16]    Meds Administered:  Meds ordered this encounter  Medications  . methylPREDNISolone acetate (DEPO-MEDROL) injection 80 mg    Laterality: Left  Location/Site:  L4-L5  Needle:5.0 in., 25 ga.  Short bevel or Quincke spinal needle  Needle Placement: Transforaminal  Findings:    -Comments: Excellent flow of contrast along the nerve, nerve root and into the epidural space. Due to foraminal narrowing patient did have some temporary pain with needle entry but no parasthesia  Procedure Details: After squaring off the end-plates to get a true AP view, the C-arm was positioned so that an oblique view of the foramen as noted above was visualized. The target area is just inferior to the "nose of the scotty dog" or sub pedicular. The soft tissues overlying this structure were infiltrated with 2-3 ml. of 1% Lidocaine without Epinephrine.  The spinal needle was inserted toward the target using a "trajectory" view along the  fluoroscope beam.  Under AP and lateral visualization, the needle was advanced so it did not puncture dura and was located close the 6 O'Clock position of the pedical in AP tracterory. Biplanar projections were used to confirm position. Aspiration was confirmed to be negative for CSF and/or blood. A 1-2 ml. volume of  Isovue-250 was injected and flow of contrast was noted at each level. Radiographs were obtained for documentation purposes.   After attaining the desired flow of contrast documented above, a 0.5 to 1.0 ml test dose of 0.25% Marcaine was injected into each respective transforaminal space.  The patient was observed for 90 seconds post injection.  After no sensory deficits were reported, and normal lower extremity motor function was noted,   the above injectate was administered so that equal amounts of the injectate were placed at each foramen (level) into the transforaminal epidural space.   Additional Comments:  The patient tolerated the procedure well Dressing: 2 x 2 sterile gauze and Band-Aid    Post-procedure details: Patient was observed during the procedure. Post-procedure instructions were reviewed.  Patient left the clinic in stable condition.      Clinical History: MRI LUMBAR SPINE WITHOUT CONTRAST  TECHNIQUE: Multiplanar, multisequence MR imaging of the lumbar spine was performed. No intravenous contrast was administered.  COMPARISON:  MRI of the lumbar spine July 27, 2015.  FINDINGS: Segmentation:  Standard.  Alignment:  Levoconvex scoliosis of the lumbar spine.  Vertebrae: No fracture, evidence of discitis, or bone lesion. Endplate degenerative changes from L1-2 through L4-5.  Conus medullaris and cauda equina: Conus extends to the T12-L1 level. Conus and cauda equina appear normal.  Paraspinal and other soft tissues: Negative.  Disc levels:  T12-L1: Shallow disc bulge and moderate facet degenerative changes without significant spinal canal or neural foraminal stenosis.  L1-2: Loss of disc height, disc bulge with associated osteophytic component and facet degenerative changes, right greater than left, resulting in narrowing of the right subarticular zone and mild right neural foraminal narrowing. No spinal canal stenosis. No significant change  from prior.  L2-3: Loss of disc height, disc bulge with associated osteophytic component and facet degenerative changes resulting in mild right neural foraminal narrowing. No spinal canal stenosis. No significant change from prior.  L3-4: Loss of disc height, disc bulge with associated osteophytic component and prominent right far lateral disc osteophyte complex. Advanced facet degenerative changes. Findings result in mild spinal canal stenosis with narrowing of the bilateral subarticular zones, moderate right and mild left neural foraminal narrowing. No significant change from prior.  L4-5: Loss of disc height, disc bulge with associated osteophytic component and left far lateral prominent disc osteophyte complex. Prominent facet degenerative changes and ligamentum flavum redundancy. Findings result in mild spinal canal stenosis with narrowing of the bilateral subarticular zones, left greater than right, mild right and severe left neural foraminal narrowing, unchanged from prior MRI.  L5-S1: Small left asymmetric disc bulge and prominent facet degenerative changes, left greater than right, resulting in narrowing of the bilateral subarticular zones and mild left neural foraminal narrowing. No significant change from prior.  IMPRESSION: 1. Multilevel degenerative changes of the lumbar spine as described above, not significantly changed from prior MRI. 2. Mild spinal canal stenosis at L3-L4 and L4-L5 with narrowing of the bilateral subarticular zones and severe left neural foraminal narrowing at L4-L5. 3. Moderate right neural foraminal narrowing at L3-L4.   Electronically Signed   By: Pedro Earls M.D.   On: 05/26/2020 12:11  Objective:  VS:  HT:    WT:   BMI:     BP:(!) 142/84  HR:70bpm  TEMP: ( )  RESP:  Physical Exam Vitals and nursing note reviewed.  Constitutional:      General: She is not in acute distress.    Appearance: Normal  appearance. She is not ill-appearing.  HENT:     Head: Normocephalic and atraumatic.     Right Ear: External ear normal.     Left Ear: External ear normal.  Eyes:     Extraocular Movements: Extraocular movements intact.  Cardiovascular:     Rate and Rhythm: Normal rate.     Pulses: Normal pulses.  Pulmonary:     Effort: Pulmonary effort is normal. No respiratory distress.  Abdominal:     General: There is no distension.     Palpations: Abdomen is soft.  Musculoskeletal:        General: Tenderness present.     Cervical back: Neck supple.     Right lower leg: No edema.     Left lower leg: No edema.     Comments: Patient has good distal strength with no pain over the greater trochanters.  No clonus or focal weakness. Scoliosis  Skin:    Findings: No erythema, lesion or rash.  Neurological:     General: No focal deficit present.     Mental Status: She is alert and oriented to person, place, and time.     Sensory: No sensory deficit.     Motor: No weakness or abnormal muscle tone.     Coordination: Coordination normal.  Psychiatric:        Mood and Affect: Mood normal.        Behavior: Behavior normal.      Imaging: Epidural Steroid injection  Result Date: 07/02/2020 Magnus Sinning, MD     07/03/2020  8:47 AM Lumbosacral Transforaminal Epidural Steroid Injection - Sub-Pedicular Approach with Fluoroscopic Guidance Patient: Kylie Wheeler     Date of Birth: November 25, 1945 MRN: [id number] PCP: Reynold Bowen, MD     Visit Date: 07/02/2020  Universal Protocol:   Date/Time: 07/02/2020 Consent Given By: the patient Position: PRONE Additional Comments: Vital signs were monitored before and after the procedure. Patient was prepped and draped in the usual sterile fashion. The correct patient, procedure, and site was verified. Injection Procedure Details: Procedure diagnoses: Lumbar radiculopathy [M54.16]  Meds Administered: Meds ordered this encounter Medications . methylPREDNISolone acetate  (DEPO-MEDROL) injection 80 mg Laterality: Left Location/Site: L4-L5 Needle:5.0 in., 25 ga.  Short bevel or Quincke spinal needle Needle Placement: Transforaminal Findings:   -Comments: Excellent flow of contrast along the nerve, nerve root and into the epidural space. Due to foraminal narrowing patient did have some temporary pain with needle entry but no parasthesia Procedure Details: After squaring off the end-plates to get a true AP view, the C-arm was positioned so that an oblique view of the foramen as noted above was visualized. The target area is just inferior to the "nose of the scotty dog" or sub pedicular. The soft tissues overlying this structure were infiltrated with 2-3 ml. of 1% Lidocaine without Epinephrine. The spinal needle was inserted toward the target using a "trajectory" view along the fluoroscope beam.  Under AP and lateral visualization, the needle was advanced so it did not puncture dura and was located close the 6 O'Clock position of the pedical in AP tracterory. Biplanar projections were used to confirm position. Aspiration was confirmed to be negative for  CSF and/or blood. A 1-2 ml. volume of Isovue-250 was injected and flow of contrast was noted at each level. Radiographs were obtained for documentation purposes. After attaining the desired flow of contrast documented above, a 0.5 to 1.0 ml test dose of 0.25% Marcaine was injected into each respective transforaminal space.  The patient was observed for 90 seconds post injection.  After no sensory deficits were reported, and normal lower extremity motor function was noted,   the above injectate was administered so that equal amounts of the injectate were placed at each foramen (level) into the transforaminal epidural space. Additional Comments: The patient tolerated the procedure well Dressing: 2 x 2 sterile gauze and Band-Aid  Post-procedure details: Patient was observed during the procedure. Post-procedure instructions were reviewed.  Patient left the clinic in stable condition.   XR C-ARM NO REPORT  Result Date: 07/02/2020 Please see Notes tab for imaging impression.

## 2020-07-08 DIAGNOSIS — M542 Cervicalgia: Secondary | ICD-10-CM | POA: Diagnosis not present

## 2020-07-08 DIAGNOSIS — M791 Myalgia, unspecified site: Secondary | ICD-10-CM | POA: Diagnosis not present

## 2020-07-08 DIAGNOSIS — G43719 Chronic migraine without aura, intractable, without status migrainosus: Secondary | ICD-10-CM | POA: Diagnosis not present

## 2020-07-08 DIAGNOSIS — G518 Other disorders of facial nerve: Secondary | ICD-10-CM | POA: Diagnosis not present

## 2020-07-22 DIAGNOSIS — M791 Myalgia, unspecified site: Secondary | ICD-10-CM | POA: Diagnosis not present

## 2020-07-22 DIAGNOSIS — M542 Cervicalgia: Secondary | ICD-10-CM | POA: Diagnosis not present

## 2020-07-22 DIAGNOSIS — G43719 Chronic migraine without aura, intractable, without status migrainosus: Secondary | ICD-10-CM | POA: Diagnosis not present

## 2020-07-22 DIAGNOSIS — G518 Other disorders of facial nerve: Secondary | ICD-10-CM | POA: Diagnosis not present

## 2020-08-02 DIAGNOSIS — M2042 Other hammer toe(s) (acquired), left foot: Secondary | ICD-10-CM | POA: Diagnosis not present

## 2020-08-02 DIAGNOSIS — M21612 Bunion of left foot: Secondary | ICD-10-CM | POA: Diagnosis not present

## 2020-08-02 DIAGNOSIS — M7742 Metatarsalgia, left foot: Secondary | ICD-10-CM | POA: Diagnosis not present

## 2020-08-14 DIAGNOSIS — M791 Myalgia, unspecified site: Secondary | ICD-10-CM | POA: Diagnosis not present

## 2020-08-14 DIAGNOSIS — M542 Cervicalgia: Secondary | ICD-10-CM | POA: Diagnosis not present

## 2020-08-14 DIAGNOSIS — G518 Other disorders of facial nerve: Secondary | ICD-10-CM | POA: Diagnosis not present

## 2020-08-14 DIAGNOSIS — G43719 Chronic migraine without aura, intractable, without status migrainosus: Secondary | ICD-10-CM | POA: Diagnosis not present

## 2020-08-27 DIAGNOSIS — M542 Cervicalgia: Secondary | ICD-10-CM | POA: Diagnosis not present

## 2020-08-27 DIAGNOSIS — G518 Other disorders of facial nerve: Secondary | ICD-10-CM | POA: Diagnosis not present

## 2020-08-27 DIAGNOSIS — G43719 Chronic migraine without aura, intractable, without status migrainosus: Secondary | ICD-10-CM | POA: Diagnosis not present

## 2020-08-27 DIAGNOSIS — M791 Myalgia, unspecified site: Secondary | ICD-10-CM | POA: Diagnosis not present

## 2020-09-03 ENCOUNTER — Other Ambulatory Visit: Payer: Self-pay | Admitting: Endocrinology

## 2020-09-03 DIAGNOSIS — Z1231 Encounter for screening mammogram for malignant neoplasm of breast: Secondary | ICD-10-CM

## 2020-09-05 DIAGNOSIS — D225 Melanocytic nevi of trunk: Secondary | ICD-10-CM | POA: Diagnosis not present

## 2020-09-05 DIAGNOSIS — Z1283 Encounter for screening for malignant neoplasm of skin: Secondary | ICD-10-CM | POA: Diagnosis not present

## 2020-09-10 DIAGNOSIS — G43719 Chronic migraine without aura, intractable, without status migrainosus: Secondary | ICD-10-CM | POA: Diagnosis not present

## 2020-09-10 DIAGNOSIS — G518 Other disorders of facial nerve: Secondary | ICD-10-CM | POA: Diagnosis not present

## 2020-09-10 DIAGNOSIS — M791 Myalgia, unspecified site: Secondary | ICD-10-CM | POA: Diagnosis not present

## 2020-09-10 DIAGNOSIS — M542 Cervicalgia: Secondary | ICD-10-CM | POA: Diagnosis not present

## 2020-10-22 ENCOUNTER — Encounter: Payer: Self-pay | Admitting: Plastic Surgery

## 2020-10-22 ENCOUNTER — Other Ambulatory Visit: Payer: Self-pay

## 2020-10-22 ENCOUNTER — Ambulatory Visit (INDEPENDENT_AMBULATORY_CARE_PROVIDER_SITE_OTHER): Payer: Self-pay | Admitting: Plastic Surgery

## 2020-10-22 DIAGNOSIS — Z719 Counseling, unspecified: Secondary | ICD-10-CM

## 2020-10-22 NOTE — Progress Notes (Signed)
Preoperative Dx: Hyperpigmentation of face  Postoperative Dx:  same  Procedure: laser to face  Anesthesia: none  Description of Procedure:  Risks and complications were explained to the patient. Consent was confirmed and signed. Time out was called and all information was confirmed to be correct. The area  area was prepped with alcohol and wiped dry. The BBL laser was set at 590 mm, Fitzpatrick III face base was done followed by targets to pigment.  The face was lasered. The patient tolerated the procedure well and there were no complications. The patient is to follow up in 4 weeks.

## 2020-10-23 DIAGNOSIS — G518 Other disorders of facial nerve: Secondary | ICD-10-CM | POA: Diagnosis not present

## 2020-10-23 DIAGNOSIS — M791 Myalgia, unspecified site: Secondary | ICD-10-CM | POA: Diagnosis not present

## 2020-10-23 DIAGNOSIS — M542 Cervicalgia: Secondary | ICD-10-CM | POA: Diagnosis not present

## 2020-10-23 DIAGNOSIS — G43719 Chronic migraine without aura, intractable, without status migrainosus: Secondary | ICD-10-CM | POA: Diagnosis not present

## 2020-10-25 ENCOUNTER — Ambulatory Visit: Payer: Medicare HMO

## 2020-10-28 ENCOUNTER — Other Ambulatory Visit: Payer: Self-pay

## 2020-10-28 ENCOUNTER — Ambulatory Visit
Admission: RE | Admit: 2020-10-28 | Discharge: 2020-10-28 | Disposition: A | Discharge status: Home / Self Care | Payer: Medicare HMO | Source: Ambulatory Visit | Attending: Endocrinology | Admitting: Endocrinology

## 2020-10-28 DIAGNOSIS — Z1231 Encounter for screening mammogram for malignant neoplasm of breast: Secondary | ICD-10-CM | POA: Diagnosis not present

## 2020-11-01 DIAGNOSIS — M4156 Other secondary scoliosis, lumbar region: Secondary | ICD-10-CM | POA: Diagnosis not present

## 2020-11-01 DIAGNOSIS — M5432 Sciatica, left side: Secondary | ICD-10-CM | POA: Diagnosis not present

## 2020-11-01 DIAGNOSIS — M4726 Other spondylosis with radiculopathy, lumbar region: Secondary | ICD-10-CM | POA: Diagnosis not present

## 2020-11-01 DIAGNOSIS — M549 Dorsalgia, unspecified: Secondary | ICD-10-CM | POA: Diagnosis not present

## 2020-11-12 ENCOUNTER — Other Ambulatory Visit: Payer: Self-pay

## 2020-11-12 ENCOUNTER — Ambulatory Visit (INDEPENDENT_AMBULATORY_CARE_PROVIDER_SITE_OTHER): Payer: Self-pay | Admitting: Plastic Surgery

## 2020-11-12 ENCOUNTER — Encounter: Payer: Self-pay | Admitting: Plastic Surgery

## 2020-11-12 DIAGNOSIS — M810 Age-related osteoporosis without current pathological fracture: Secondary | ICD-10-CM | POA: Diagnosis not present

## 2020-11-12 DIAGNOSIS — Z719 Counseling, unspecified: Secondary | ICD-10-CM

## 2020-11-12 DIAGNOSIS — E559 Vitamin D deficiency, unspecified: Secondary | ICD-10-CM | POA: Diagnosis not present

## 2020-11-12 NOTE — Progress Notes (Signed)
[place] Preoperative Dx: hyperpigmentation of face  Postoperative Dx:  same  Procedure: laser to face   Anesthesia: none  Description of Procedure:  Risks and complications were explained to the patient. Consent was confirmed and signed. Time out was called and all information was confirmed to be correct. The area  area was prepped with alcohol and wiped dry. The BBL laser was set at 7 J/cm2. The face was lasered. The patient tolerated the procedure well and there were no complications. The patient is to follow up in 4 weeks.

## 2020-11-13 ENCOUNTER — Telehealth: Payer: Self-pay

## 2020-11-13 NOTE — Telephone Encounter (Signed)
Call returned to pt regarding the following: She had her 2nd  BBL laser with Dr. Marla Roe yesterday 11/12/20- & is reporting that she has multiple red/raised "itchy" bumps. Left cheek has "patch" of these - only a few on the right side She also has "red/blisters" on her chin area & left side of mouth.  Two of the blister type lesions have ruptured & she states- the drainage is clear. Denies pain with any of the areas. She continues to use Cetaphil/moisturizer & sunscreen. She indicated that she did not have this same reaction with the the 1st BBL laser treatment, but she understands that this reaction may be d/t increase in the intensity of the laser for subsequent sessions. She did indicate that she is prone to "cold sores" with other procedures in the past.  I informed her that I would forward this info to Dr. Marla Roe & verified her pharmacy preference.  I instructed her to continue to use moisturizer/sunscreen- but to avoid any harsh scrubs/retinol products at this time.   She will forward pictures to my "secure" e-mail address at [email or web address] I will consult with Dr. Marla Roe for review & for further care. She agrees with the above plan.

## 2020-11-13 NOTE — Telephone Encounter (Signed)
Patient left voicemail to state that she has noticed many bumps on the left side of her face as well as red puffy blisters on her chin after laser treatment yesterday. She would like to know how to treat this.

## 2020-11-14 ENCOUNTER — Telehealth: Payer: Self-pay

## 2020-11-14 NOTE — Telephone Encounter (Signed)
Patient called- she spoke to [facility]  & requested to schedule an appointment with Dr. Marla Roe for the following: Per Maudie Mercury- pt indicated that her symptoms have gotten worse. Kim scheduled the pt for 11/15/20

## 2020-11-14 NOTE — Telephone Encounter (Signed)
Patient called to follow-up to let us know that her symptoms have worsened.  Patient said that she sent a picture to Dr. Marla Roe yesterday via [employer].  Patient said that the left side of her face and her forehead have sores and they are itching.  She also said that her forehead has broken out.  Patient would like for Dr. Marla Roe to take a look at it.  Please call.

## 2020-11-15 ENCOUNTER — Other Ambulatory Visit: Payer: Self-pay

## 2020-11-15 ENCOUNTER — Ambulatory Visit (INDEPENDENT_AMBULATORY_CARE_PROVIDER_SITE_OTHER): Payer: Self-pay | Admitting: Plastic Surgery

## 2020-11-15 ENCOUNTER — Telehealth: Payer: Self-pay

## 2020-11-15 ENCOUNTER — Encounter: Payer: Self-pay | Admitting: Plastic Surgery

## 2020-11-15 DIAGNOSIS — Z719 Counseling, unspecified: Secondary | ICD-10-CM

## 2020-11-15 MED ORDER — DOXYCYCLINE HYCLATE 100 MG PO TABS: 100.0000 mg | ORAL_TABLET | Freq: Two times a day (BID) | ORAL | 0 refills | Status: AC

## 2020-11-15 MED ORDER — VALACYCLOVIR HCL 500 MG PO TABS: 500.0000 mg | ORAL_TABLET | Freq: Two times a day (BID) | ORAL | 0 refills | Status: AC

## 2020-11-15 NOTE — Addendum Note (Signed)
Addended by: Wallace Going on: 11/15/2020 04:47 PM   Modules accepted: Orders

## 2020-11-15 NOTE — Progress Notes (Addendum)
The patient is a 75 year old female here for follow-up after laser.  She noticed some bumps on her face on both sides of her forehead and cheek.  She was a little concerned it was a problem.  I think that it is a combination of perhaps a mild burn and some acne brought on by the laser.  I think it is going to heal and do well.  Due to her history of fever blisters I am going to send in some Zovirax for her.  I do want her to give me a call first thing next week if it does not start to clear.  I might need to do some doxycycline for the acne.  Patient is in agreement.  The patient called back and asked that the Doxy be called in to the pharmacy here.  I will be happy to do that.

## 2020-11-15 NOTE — Telephone Encounter (Signed)
Called and spoke with the patient and informed her that the prescription was sent to the pharmacy.    Patient verbalized understanding and said thank you for she has already picked up the prescription.//AB/CMA

## 2020-11-15 NOTE — Telephone Encounter (Signed)
Patient called to ask if Dr. Marla Roe would be willing to go ahead and send the prescription in for doxycycline today to her pharmacy here in [place].  Patient said that she is leaving town tomorrow, and rather than try to find a pharmacy in another town, if possible, she would like to just get it from her pharmacy here.  Patient said she will only take the medicine if she needs it, but this way, she would have it on-hand.  Please call.  **Patient's preferred pharmacy is [facility] at [facility].

## 2020-11-25 ENCOUNTER — Encounter: Payer: Self-pay | Admitting: Plastic Surgery

## 2020-11-25 ENCOUNTER — Telehealth: Payer: Self-pay | Admitting: Plastic Surgery

## 2020-11-25 NOTE — Telephone Encounter (Signed)
Patient concerned about redness that remains after laser and would like for Dr. Marla Roe to take a look. Please review mychart message and advise patient.

## 2020-11-25 NOTE — Telephone Encounter (Signed)
Matt communicated with patient via [place].

## 2020-11-28 DIAGNOSIS — M545 Low back pain, unspecified: Secondary | ICD-10-CM | POA: Diagnosis not present

## 2020-11-28 DIAGNOSIS — M4150 Other secondary scoliosis, site unspecified: Secondary | ICD-10-CM | POA: Diagnosis not present

## 2020-11-28 DIAGNOSIS — M25552 Pain in left hip: Secondary | ICD-10-CM | POA: Diagnosis not present

## 2020-11-28 DIAGNOSIS — G8929 Other chronic pain: Secondary | ICD-10-CM | POA: Diagnosis not present

## 2020-11-28 DIAGNOSIS — M546 Pain in thoracic spine: Secondary | ICD-10-CM | POA: Diagnosis not present

## 2020-11-28 DIAGNOSIS — M6281 Muscle weakness (generalized): Secondary | ICD-10-CM | POA: Diagnosis not present

## 2020-11-29 DIAGNOSIS — M4156 Other secondary scoliosis, lumbar region: Secondary | ICD-10-CM | POA: Diagnosis not present

## 2020-11-29 DIAGNOSIS — M5432 Sciatica, left side: Secondary | ICD-10-CM | POA: Diagnosis not present

## 2020-11-29 DIAGNOSIS — M4726 Other spondylosis with radiculopathy, lumbar region: Secondary | ICD-10-CM | POA: Diagnosis not present

## 2020-12-02 DIAGNOSIS — M546 Pain in thoracic spine: Secondary | ICD-10-CM | POA: Diagnosis not present

## 2020-12-02 DIAGNOSIS — M545 Low back pain, unspecified: Secondary | ICD-10-CM | POA: Diagnosis not present

## 2020-12-02 DIAGNOSIS — G8929 Other chronic pain: Secondary | ICD-10-CM | POA: Diagnosis not present

## 2020-12-02 DIAGNOSIS — M25552 Pain in left hip: Secondary | ICD-10-CM | POA: Diagnosis not present

## 2020-12-02 DIAGNOSIS — M4150 Other secondary scoliosis, site unspecified: Secondary | ICD-10-CM | POA: Diagnosis not present

## 2020-12-02 DIAGNOSIS — M6281 Muscle weakness (generalized): Secondary | ICD-10-CM | POA: Diagnosis not present

## 2020-12-10 ENCOUNTER — Encounter: Payer: Self-pay | Admitting: Plastic Surgery

## 2020-12-10 ENCOUNTER — Telehealth (INDEPENDENT_AMBULATORY_CARE_PROVIDER_SITE_OTHER): Payer: Medicare HMO | Admitting: Plastic Surgery

## 2020-12-10 DIAGNOSIS — G43719 Chronic migraine without aura, intractable, without status migrainosus: Secondary | ICD-10-CM | POA: Diagnosis not present

## 2020-12-10 DIAGNOSIS — G518 Other disorders of facial nerve: Secondary | ICD-10-CM | POA: Diagnosis not present

## 2020-12-10 DIAGNOSIS — Z719 Counseling, unspecified: Secondary | ICD-10-CM

## 2020-12-10 DIAGNOSIS — M791 Myalgia, unspecified site: Secondary | ICD-10-CM | POA: Diagnosis not present

## 2020-12-10 DIAGNOSIS — M542 Cervicalgia: Secondary | ICD-10-CM | POA: Diagnosis not present

## 2020-12-10 NOTE — Progress Notes (Signed)
The patient joined me by phone.  We could not get the computer working.  She said overall she is doing better.  I would like her to come in for a visit in the next week or so and lets take a look.  She thinks that is a good idea and will get that arranged for her.

## 2020-12-12 DIAGNOSIS — M546 Pain in thoracic spine: Secondary | ICD-10-CM | POA: Diagnosis not present

## 2020-12-12 DIAGNOSIS — M25552 Pain in left hip: Secondary | ICD-10-CM | POA: Diagnosis not present

## 2020-12-12 DIAGNOSIS — M6281 Muscle weakness (generalized): Secondary | ICD-10-CM | POA: Diagnosis not present

## 2020-12-12 DIAGNOSIS — M4150 Other secondary scoliosis, site unspecified: Secondary | ICD-10-CM | POA: Diagnosis not present

## 2020-12-12 DIAGNOSIS — G8929 Other chronic pain: Secondary | ICD-10-CM | POA: Diagnosis not present

## 2020-12-12 DIAGNOSIS — M545 Low back pain, unspecified: Secondary | ICD-10-CM | POA: Diagnosis not present

## 2020-12-18 DIAGNOSIS — M25552 Pain in left hip: Secondary | ICD-10-CM | POA: Diagnosis not present

## 2020-12-18 DIAGNOSIS — M545 Low back pain, unspecified: Secondary | ICD-10-CM | POA: Diagnosis not present

## 2020-12-18 DIAGNOSIS — M4150 Other secondary scoliosis, site unspecified: Secondary | ICD-10-CM | POA: Diagnosis not present

## 2020-12-18 DIAGNOSIS — M546 Pain in thoracic spine: Secondary | ICD-10-CM | POA: Diagnosis not present

## 2020-12-18 DIAGNOSIS — G8929 Other chronic pain: Secondary | ICD-10-CM | POA: Diagnosis not present

## 2020-12-18 DIAGNOSIS — M6281 Muscle weakness (generalized): Secondary | ICD-10-CM | POA: Diagnosis not present

## 2020-12-19 DIAGNOSIS — M5416 Radiculopathy, lumbar region: Secondary | ICD-10-CM | POA: Diagnosis not present

## 2020-12-19 DIAGNOSIS — M4156 Other secondary scoliosis, lumbar region: Secondary | ICD-10-CM | POA: Diagnosis not present

## 2020-12-20 DIAGNOSIS — M25552 Pain in left hip: Secondary | ICD-10-CM | POA: Diagnosis not present

## 2020-12-20 DIAGNOSIS — M546 Pain in thoracic spine: Secondary | ICD-10-CM | POA: Diagnosis not present

## 2020-12-20 DIAGNOSIS — G8929 Other chronic pain: Secondary | ICD-10-CM | POA: Diagnosis not present

## 2020-12-20 DIAGNOSIS — M545 Low back pain, unspecified: Secondary | ICD-10-CM | POA: Diagnosis not present

## 2020-12-20 DIAGNOSIS — M6281 Muscle weakness (generalized): Secondary | ICD-10-CM | POA: Diagnosis not present

## 2020-12-20 DIAGNOSIS — M4150 Other secondary scoliosis, site unspecified: Secondary | ICD-10-CM | POA: Diagnosis not present

## 2020-12-23 DIAGNOSIS — M6281 Muscle weakness (generalized): Secondary | ICD-10-CM | POA: Diagnosis not present

## 2020-12-23 DIAGNOSIS — M546 Pain in thoracic spine: Secondary | ICD-10-CM | POA: Diagnosis not present

## 2020-12-23 DIAGNOSIS — M545 Low back pain, unspecified: Secondary | ICD-10-CM | POA: Diagnosis not present

## 2020-12-23 DIAGNOSIS — M25552 Pain in left hip: Secondary | ICD-10-CM | POA: Diagnosis not present

## 2020-12-23 DIAGNOSIS — G8929 Other chronic pain: Secondary | ICD-10-CM | POA: Diagnosis not present

## 2020-12-23 DIAGNOSIS — M4150 Other secondary scoliosis, site unspecified: Secondary | ICD-10-CM | POA: Diagnosis not present

## 2020-12-24 ENCOUNTER — Encounter: Payer: Self-pay | Admitting: Plastic Surgery

## 2020-12-24 ENCOUNTER — Ambulatory Visit (INDEPENDENT_AMBULATORY_CARE_PROVIDER_SITE_OTHER): Payer: Self-pay | Admitting: Plastic Surgery

## 2020-12-24 ENCOUNTER — Other Ambulatory Visit: Payer: Self-pay

## 2020-12-24 DIAGNOSIS — Z719 Counseling, unspecified: Secondary | ICD-10-CM

## 2020-12-24 MED ORDER — ERYTHROMYCIN 5 MG/GM OP OINT: 1.0000 "application " | TOPICAL_OINTMENT | Freq: Two times a day (BID) | OPHTHALMIC | 0 refills | Status: DC

## 2020-12-24 MED ORDER — SYSTANE 0.4-0.3 % OP GEL: 1.0000 "application " | Freq: Three times a day (TID) | OPHTHALMIC | 0 refills | Status: DC | PRN

## 2020-12-24 NOTE — Addendum Note (Signed)
Addended by: Wallace Going on: 12/24/2020 08:54 AM   Modules accepted: Orders

## 2020-12-24 NOTE — Progress Notes (Addendum)
The patient is a 75 year old female here for follow-up after her laser.  She is doing really well.  All the areas have healed.  She is got a birthday coming up and will be out of town.  She is good to come back and see [place] when she gets back.  In the meantime I recommend sunblock.  Pictures were obtained of the patient and placed in the chart with the patient's or guardian's permission.

## 2020-12-25 DIAGNOSIS — M5416 Radiculopathy, lumbar region: Secondary | ICD-10-CM | POA: Diagnosis not present

## 2020-12-25 DIAGNOSIS — M810 Age-related osteoporosis without current pathological fracture: Secondary | ICD-10-CM | POA: Diagnosis not present

## 2020-12-25 DIAGNOSIS — Z79899 Other long term (current) drug therapy: Secondary | ICD-10-CM | POA: Diagnosis not present

## 2020-12-25 DIAGNOSIS — E559 Vitamin D deficiency, unspecified: Secondary | ICD-10-CM | POA: Diagnosis not present

## 2020-12-26 DIAGNOSIS — H04123 Dry eye syndrome of bilateral lacrimal glands: Secondary | ICD-10-CM | POA: Diagnosis not present

## 2020-12-26 DIAGNOSIS — H10413 Chronic giant papillary conjunctivitis, bilateral: Secondary | ICD-10-CM | POA: Diagnosis not present

## 2021-01-06 DIAGNOSIS — M546 Pain in thoracic spine: Secondary | ICD-10-CM | POA: Diagnosis not present

## 2021-01-06 DIAGNOSIS — M25552 Pain in left hip: Secondary | ICD-10-CM | POA: Diagnosis not present

## 2021-01-06 DIAGNOSIS — M545 Low back pain, unspecified: Secondary | ICD-10-CM | POA: Diagnosis not present

## 2021-01-06 DIAGNOSIS — M4150 Other secondary scoliosis, site unspecified: Secondary | ICD-10-CM | POA: Diagnosis not present

## 2021-01-06 DIAGNOSIS — G8929 Other chronic pain: Secondary | ICD-10-CM | POA: Diagnosis not present

## 2021-01-06 DIAGNOSIS — M6281 Muscle weakness (generalized): Secondary | ICD-10-CM | POA: Diagnosis not present

## 2021-01-09 ENCOUNTER — Encounter (HOSPITAL_COMMUNITY): Payer: Medicare HMO

## 2021-01-13 DIAGNOSIS — M5416 Radiculopathy, lumbar region: Secondary | ICD-10-CM | POA: Diagnosis not present

## 2021-01-13 DIAGNOSIS — M4156 Other secondary scoliosis, lumbar region: Secondary | ICD-10-CM | POA: Diagnosis not present

## 2021-01-14 ENCOUNTER — Other Ambulatory Visit (HOSPITAL_COMMUNITY): Payer: Self-pay | Admitting: *Deleted

## 2021-01-15 ENCOUNTER — Ambulatory Visit (HOSPITAL_COMMUNITY)
Admission: RE | Admit: 2021-01-15 | Discharge: 2021-01-15 | Disposition: A | Discharge status: Home / Self Care | Payer: Medicare HMO | Source: Ambulatory Visit | Attending: Endocrinology | Admitting: Endocrinology

## 2021-01-15 ENCOUNTER — Other Ambulatory Visit: Payer: Self-pay

## 2021-01-15 DIAGNOSIS — M4150 Other secondary scoliosis, site unspecified: Secondary | ICD-10-CM | POA: Diagnosis not present

## 2021-01-15 DIAGNOSIS — M810 Age-related osteoporosis without current pathological fracture: Secondary | ICD-10-CM | POA: Diagnosis present

## 2021-01-15 DIAGNOSIS — M545 Low back pain, unspecified: Secondary | ICD-10-CM | POA: Diagnosis not present

## 2021-01-15 DIAGNOSIS — M25552 Pain in left hip: Secondary | ICD-10-CM | POA: Diagnosis not present

## 2021-01-15 DIAGNOSIS — M546 Pain in thoracic spine: Secondary | ICD-10-CM | POA: Diagnosis not present

## 2021-01-15 DIAGNOSIS — G8929 Other chronic pain: Secondary | ICD-10-CM | POA: Diagnosis not present

## 2021-01-15 DIAGNOSIS — M6281 Muscle weakness (generalized): Secondary | ICD-10-CM | POA: Diagnosis not present

## 2021-01-15 MED ORDER — DENOSUMAB 60 MG/ML ~~LOC~~ SOSY
PREFILLED_SYRINGE | SUBCUTANEOUS | Status: AC
  Filled 2021-01-15: qty 1

## 2021-01-15 MED ORDER — DENOSUMAB 60 MG/ML ~~LOC~~ SOSY
60.0000 mg | PREFILLED_SYRINGE | Freq: Once | SUBCUTANEOUS | Status: AC
  Administered 2021-01-15: 60 mg via SUBCUTANEOUS

## 2021-01-17 DIAGNOSIS — M545 Low back pain, unspecified: Secondary | ICD-10-CM | POA: Diagnosis not present

## 2021-01-17 DIAGNOSIS — G8929 Other chronic pain: Secondary | ICD-10-CM | POA: Diagnosis not present

## 2021-01-17 DIAGNOSIS — M546 Pain in thoracic spine: Secondary | ICD-10-CM | POA: Diagnosis not present

## 2021-01-17 DIAGNOSIS — M25552 Pain in left hip: Secondary | ICD-10-CM | POA: Diagnosis not present

## 2021-01-17 DIAGNOSIS — M6281 Muscle weakness (generalized): Secondary | ICD-10-CM | POA: Diagnosis not present

## 2021-01-17 DIAGNOSIS — M4150 Other secondary scoliosis, site unspecified: Secondary | ICD-10-CM | POA: Diagnosis not present

## 2021-01-21 DIAGNOSIS — G43719 Chronic migraine without aura, intractable, without status migrainosus: Secondary | ICD-10-CM | POA: Diagnosis not present

## 2021-01-21 DIAGNOSIS — G518 Other disorders of facial nerve: Secondary | ICD-10-CM | POA: Diagnosis not present

## 2021-01-21 DIAGNOSIS — M791 Myalgia, unspecified site: Secondary | ICD-10-CM | POA: Diagnosis not present

## 2021-01-21 DIAGNOSIS — M542 Cervicalgia: Secondary | ICD-10-CM | POA: Diagnosis not present

## 2021-01-22 DIAGNOSIS — M5416 Radiculopathy, lumbar region: Secondary | ICD-10-CM | POA: Diagnosis not present

## 2021-01-27 ENCOUNTER — Encounter (HOSPITAL_COMMUNITY): Payer: Medicare HMO

## 2021-01-29 DIAGNOSIS — M545 Low back pain, unspecified: Secondary | ICD-10-CM | POA: Diagnosis not present

## 2021-01-29 DIAGNOSIS — M546 Pain in thoracic spine: Secondary | ICD-10-CM | POA: Diagnosis not present

## 2021-01-29 DIAGNOSIS — M6281 Muscle weakness (generalized): Secondary | ICD-10-CM | POA: Diagnosis not present

## 2021-01-29 DIAGNOSIS — M25552 Pain in left hip: Secondary | ICD-10-CM | POA: Diagnosis not present

## 2021-01-29 DIAGNOSIS — M4150 Other secondary scoliosis, site unspecified: Secondary | ICD-10-CM | POA: Diagnosis not present

## 2021-01-29 DIAGNOSIS — G8929 Other chronic pain: Secondary | ICD-10-CM | POA: Diagnosis not present

## 2021-02-12 DIAGNOSIS — M4156 Other secondary scoliosis, lumbar region: Secondary | ICD-10-CM | POA: Diagnosis not present

## 2021-02-12 DIAGNOSIS — M5416 Radiculopathy, lumbar region: Secondary | ICD-10-CM | POA: Diagnosis not present

## 2021-02-26 DIAGNOSIS — M546 Pain in thoracic spine: Secondary | ICD-10-CM | POA: Diagnosis not present

## 2021-02-26 DIAGNOSIS — G8929 Other chronic pain: Secondary | ICD-10-CM | POA: Diagnosis not present

## 2021-02-26 DIAGNOSIS — M6281 Muscle weakness (generalized): Secondary | ICD-10-CM | POA: Diagnosis not present

## 2021-02-26 DIAGNOSIS — M549 Dorsalgia, unspecified: Secondary | ICD-10-CM | POA: Diagnosis not present

## 2021-02-26 DIAGNOSIS — R140 Abdominal distension (gaseous): Secondary | ICD-10-CM | POA: Diagnosis not present

## 2021-02-26 DIAGNOSIS — Z8371 Family history of colonic polyps: Secondary | ICD-10-CM | POA: Diagnosis not present

## 2021-02-26 DIAGNOSIS — M25552 Pain in left hip: Secondary | ICD-10-CM | POA: Diagnosis not present

## 2021-02-26 DIAGNOSIS — R1013 Epigastric pain: Secondary | ICD-10-CM | POA: Diagnosis not present

## 2021-02-26 DIAGNOSIS — R197 Diarrhea, unspecified: Secondary | ICD-10-CM | POA: Diagnosis not present

## 2021-02-26 DIAGNOSIS — M545 Low back pain, unspecified: Secondary | ICD-10-CM | POA: Diagnosis not present

## 2021-02-26 DIAGNOSIS — M4150 Other secondary scoliosis, site unspecified: Secondary | ICD-10-CM | POA: Diagnosis not present

## 2021-02-28 DIAGNOSIS — H10413 Chronic giant papillary conjunctivitis, bilateral: Secondary | ICD-10-CM | POA: Diagnosis not present

## 2021-02-28 DIAGNOSIS — H04123 Dry eye syndrome of bilateral lacrimal glands: Secondary | ICD-10-CM | POA: Diagnosis not present

## 2021-03-05 DIAGNOSIS — M6281 Muscle weakness (generalized): Secondary | ICD-10-CM | POA: Diagnosis not present

## 2021-03-05 DIAGNOSIS — M4150 Other secondary scoliosis, site unspecified: Secondary | ICD-10-CM | POA: Diagnosis not present

## 2021-03-05 DIAGNOSIS — G8929 Other chronic pain: Secondary | ICD-10-CM | POA: Diagnosis not present

## 2021-03-05 DIAGNOSIS — M546 Pain in thoracic spine: Secondary | ICD-10-CM | POA: Diagnosis not present

## 2021-03-05 DIAGNOSIS — M545 Low back pain, unspecified: Secondary | ICD-10-CM | POA: Diagnosis not present

## 2021-03-05 DIAGNOSIS — M25552 Pain in left hip: Secondary | ICD-10-CM | POA: Diagnosis not present

## 2021-03-12 DIAGNOSIS — M545 Low back pain, unspecified: Secondary | ICD-10-CM | POA: Diagnosis not present

## 2021-03-12 DIAGNOSIS — M6281 Muscle weakness (generalized): Secondary | ICD-10-CM | POA: Diagnosis not present

## 2021-03-12 DIAGNOSIS — M25552 Pain in left hip: Secondary | ICD-10-CM | POA: Diagnosis not present

## 2021-03-12 DIAGNOSIS — G8929 Other chronic pain: Secondary | ICD-10-CM | POA: Diagnosis not present

## 2021-03-12 DIAGNOSIS — M4150 Other secondary scoliosis, site unspecified: Secondary | ICD-10-CM | POA: Diagnosis not present

## 2021-03-12 DIAGNOSIS — M546 Pain in thoracic spine: Secondary | ICD-10-CM | POA: Diagnosis not present

## 2021-03-18 DIAGNOSIS — M542 Cervicalgia: Secondary | ICD-10-CM | POA: Diagnosis not present

## 2021-03-18 DIAGNOSIS — G43719 Chronic migraine without aura, intractable, without status migrainosus: Secondary | ICD-10-CM | POA: Diagnosis not present

## 2021-03-18 DIAGNOSIS — G518 Other disorders of facial nerve: Secondary | ICD-10-CM | POA: Diagnosis not present

## 2021-03-18 DIAGNOSIS — M791 Myalgia, unspecified site: Secondary | ICD-10-CM | POA: Diagnosis not present

## 2021-03-19 DIAGNOSIS — M546 Pain in thoracic spine: Secondary | ICD-10-CM | POA: Diagnosis not present

## 2021-03-19 DIAGNOSIS — M4150 Other secondary scoliosis, site unspecified: Secondary | ICD-10-CM | POA: Diagnosis not present

## 2021-03-19 DIAGNOSIS — M6281 Muscle weakness (generalized): Secondary | ICD-10-CM | POA: Diagnosis not present

## 2021-03-19 DIAGNOSIS — G8929 Other chronic pain: Secondary | ICD-10-CM | POA: Diagnosis not present

## 2021-03-19 DIAGNOSIS — M545 Low back pain, unspecified: Secondary | ICD-10-CM | POA: Diagnosis not present

## 2021-03-19 DIAGNOSIS — M25552 Pain in left hip: Secondary | ICD-10-CM | POA: Diagnosis not present

## 2021-03-24 DIAGNOSIS — H04123 Dry eye syndrome of bilateral lacrimal glands: Secondary | ICD-10-CM | POA: Diagnosis not present

## 2021-03-24 DIAGNOSIS — H5203 Hypermetropia, bilateral: Secondary | ICD-10-CM | POA: Diagnosis not present

## 2021-03-24 DIAGNOSIS — H2513 Age-related nuclear cataract, bilateral: Secondary | ICD-10-CM | POA: Diagnosis not present

## 2021-03-26 DIAGNOSIS — M5416 Radiculopathy, lumbar region: Secondary | ICD-10-CM | POA: Diagnosis not present

## 2021-03-26 DIAGNOSIS — M25552 Pain in left hip: Secondary | ICD-10-CM | POA: Diagnosis not present

## 2021-03-26 DIAGNOSIS — M4156 Other secondary scoliosis, lumbar region: Secondary | ICD-10-CM | POA: Diagnosis not present

## 2021-03-26 DIAGNOSIS — G8929 Other chronic pain: Secondary | ICD-10-CM | POA: Diagnosis not present

## 2021-03-26 DIAGNOSIS — M545 Low back pain, unspecified: Secondary | ICD-10-CM | POA: Diagnosis not present

## 2021-03-26 DIAGNOSIS — M6281 Muscle weakness (generalized): Secondary | ICD-10-CM | POA: Diagnosis not present

## 2021-03-26 DIAGNOSIS — M4150 Other secondary scoliosis, site unspecified: Secondary | ICD-10-CM | POA: Diagnosis not present

## 2021-03-26 DIAGNOSIS — M546 Pain in thoracic spine: Secondary | ICD-10-CM | POA: Diagnosis not present

## 2021-03-27 DIAGNOSIS — M25511 Pain in right shoulder: Secondary | ICD-10-CM | POA: Diagnosis not present

## 2021-04-02 DIAGNOSIS — K219 Gastro-esophageal reflux disease without esophagitis: Secondary | ICD-10-CM | POA: Diagnosis not present

## 2021-04-02 DIAGNOSIS — G43909 Migraine, unspecified, not intractable, without status migrainosus: Secondary | ICD-10-CM | POA: Diagnosis not present

## 2021-04-02 DIAGNOSIS — R69 Illness, unspecified: Secondary | ICD-10-CM | POA: Diagnosis not present

## 2021-04-02 DIAGNOSIS — M5481 Occipital neuralgia: Secondary | ICD-10-CM | POA: Diagnosis not present

## 2021-04-02 DIAGNOSIS — M7742 Metatarsalgia, left foot: Secondary | ICD-10-CM | POA: Diagnosis not present

## 2021-04-02 DIAGNOSIS — E785 Hyperlipidemia, unspecified: Secondary | ICD-10-CM | POA: Diagnosis not present

## 2021-04-02 DIAGNOSIS — M2042 Other hammer toe(s) (acquired), left foot: Secondary | ICD-10-CM | POA: Diagnosis not present

## 2021-04-02 DIAGNOSIS — M810 Age-related osteoporosis without current pathological fracture: Secondary | ICD-10-CM | POA: Diagnosis not present

## 2021-04-02 DIAGNOSIS — K635 Polyp of colon: Secondary | ICD-10-CM | POA: Diagnosis not present

## 2021-04-02 DIAGNOSIS — M21612 Bunion of left foot: Secondary | ICD-10-CM | POA: Diagnosis not present

## 2021-04-02 DIAGNOSIS — E559 Vitamin D deficiency, unspecified: Secondary | ICD-10-CM | POA: Diagnosis not present

## 2021-04-03 DIAGNOSIS — M6281 Muscle weakness (generalized): Secondary | ICD-10-CM | POA: Diagnosis not present

## 2021-04-03 DIAGNOSIS — M545 Low back pain, unspecified: Secondary | ICD-10-CM | POA: Diagnosis not present

## 2021-04-03 DIAGNOSIS — G8929 Other chronic pain: Secondary | ICD-10-CM | POA: Diagnosis not present

## 2021-04-03 DIAGNOSIS — M4150 Other secondary scoliosis, site unspecified: Secondary | ICD-10-CM | POA: Diagnosis not present

## 2021-04-03 DIAGNOSIS — M546 Pain in thoracic spine: Secondary | ICD-10-CM | POA: Diagnosis not present

## 2021-04-03 DIAGNOSIS — M25552 Pain in left hip: Secondary | ICD-10-CM | POA: Diagnosis not present

## 2021-04-10 ENCOUNTER — Telehealth: Payer: Self-pay | Admitting: Gastroenterology

## 2021-04-10 NOTE — Telephone Encounter (Signed)
Hey Dr. Tarri Glenn,   We received a transfer of care for abd pain, bowel movements, and trouble eating. States she is wanting to transfer to you because Dr. Earlean Shawl is retiring and she have heard great things. I have paper records as well as records in [place]. Could you please review and advise on scheduling?  Thank you

## 2021-04-11 NOTE — Telephone Encounter (Signed)
7 pages of records reviewed from Dr. Liliane Channel office.  She has a history of migraines, neck arthritis, right knee meniscal tear, scoliosis, and adenomatous polyps.  The patient was last seen 02/26/2021 for frequent bowel movements, flatus, and abdominal pain.  Symptoms ongoing for 6 months.  She is most bothered by the gas and frequent bowel movements.  She is having 3-4 urgent, formed to soft stools daily, with passage of excessive rectal gas and belching.  Stools are nonbloody.  She associates her symptoms with consumption of sweets, particularly ice cream.  There is epigastric pain which she describes as a "sour" feeling.  Pain is improved with use of antacid and with meals.  No specific time when her symptoms are most prominent nor do they awaken her at night.  No nausea, vomiting, or weight loss.  She has chronic back pain for which she takes gabapentin.  Symptoms attributed to dyspepsia.  Endoscopic evaluation recommended.  Empiric treatment with famotidine for the milligrams nightly was offered but the patient was concerned about masking underlying pathology.  Bloating thought to be due to lactose intolerance.  There is a family history of colon polyps.  Details of prior colonoscopy not obvious in these records.

## 2021-04-16 ENCOUNTER — Encounter: Payer: Self-pay | Admitting: Gastroenterology

## 2021-04-16 NOTE — Telephone Encounter (Signed)
Patient saw Dr. Earlean Shawl, which is her only GI history.  Made appointment.

## 2021-04-16 NOTE — Telephone Encounter (Signed)
Spoke with patient she have had colonoscopy with Dr. Earlean Shawl. States she will call them and have them to make sure they send the records as she requested the first time.

## 2021-04-16 NOTE — Telephone Encounter (Signed)
[facility] for patient previous colonoscopy/egd reports. States patient does not have history for procedures.   Left vm with patient to schedule appt and to ask about previous GI providers to obtain records.

## 2021-04-30 DIAGNOSIS — M5416 Radiculopathy, lumbar region: Secondary | ICD-10-CM | POA: Diagnosis not present

## 2021-05-18 DIAGNOSIS — R059 Cough, unspecified: Secondary | ICD-10-CM | POA: Diagnosis not present

## 2021-05-20 ENCOUNTER — Ambulatory Visit: Payer: Medicare HMO | Admitting: Gastroenterology

## 2021-05-20 ENCOUNTER — Encounter: Payer: Self-pay | Admitting: Gastroenterology

## 2021-05-20 VITALS — BP 126/70 | HR 76 | Ht 61.0 in | Wt 148.8 lb

## 2021-05-20 DIAGNOSIS — R194 Change in bowel habit: Secondary | ICD-10-CM | POA: Diagnosis not present

## 2021-05-20 DIAGNOSIS — R140 Abdominal distension (gaseous): Secondary | ICD-10-CM

## 2021-05-20 DIAGNOSIS — R1084 Generalized abdominal pain: Secondary | ICD-10-CM | POA: Diagnosis not present

## 2021-05-20 MED ORDER — PLENVU 140 G PO SOLR: 140.0000 g | ORAL | 0 refills | Status: DC

## 2021-05-20 MED ORDER — RIFAXIMIN 550 MG PO TABS: 550.0000 mg | ORAL_TABLET | Freq: Two times a day (BID) | ORAL | 0 refills | Status: DC

## 2021-05-20 NOTE — Progress Notes (Signed)
Referring Provider: Reynold Bowen, MD Primary Care Physician:  Reynold Bowen, MD   Reason for Consultation:  Abdominal pain, change in bowel movements, and gas   IMPRESSION:  Change in bowel habits - now with more frequent stools with change in caliber Abdominal pain Lactose intolerance with some weight loss  History of colon polyps on prior colonoscopy with Dr. Allyn Kenner  PLAN: - Consider trial of probiotics - Xifaxan 550 mg TID x 14 days, substitute with doxycycline 100 mg BID x 14 if Xifaxan is cost-prohibitive for possible SIBO - EGD with biopsies - Colonoscopy with random colon biopsies  I spent 45 minutes, including in depth chart review, independent review of results, face-to-face time with the patient, coordinating care, ordering studies and medications as appropriate, and documentation.     HPI: Kylie Wheeler is a 75 y.o. female referred for abdominal pain, change in bowel habits, and gas. The history is obtained through the patient and review of her electronic health record. She has previously been under the care of Dr. Earlean Shawl.  21 pages of records reviewed from Dr. Liliane Channel office, as well.  She has a history of migraines, neck arthritis, right knee meniscal tear, scoliosis, and adenomatous polyps. She was diagnosed with ulcerative colitis in her 59s but is not aware of any additional findings since that time.    The patient was last seen by Dr. Allyn Kenner 02/26/2021 for 6 months of frequent bowel movements, flatus, and abdominal pain. She is most bothered by the gas and frequent bowel movements.  She was having 3-4 urgent, formed to soft stools daily, with passage of excessive rectal gas and belching.  She associated her symptoms with consumption of sweets, particularly ice cream.  There is epigastric pain which she describes as a "sour" feeling.  Pain is improved with use of antacid and with meals.  No specific time when her symptoms are most prominent nor do they awaken her  at night.  No nausea, vomiting, or weight loss.  She has chronic back pain for which she takes gabapentin.   Symptoms attributed to dyspepsia.  Endoscopic evaluation recommended.  Empiric treatment with famotidine 40 mg nightly was offered but the patient was concerned about masking underlying pathology. Some syptoms thought to be due to lactose intolerance.   She presents today seeking a second opinion, reporting a lump below the sternum, change in bowel movements to think stools, and gas that improved since she stopped dairy.   Bowel habits changed 6 months ago with associated gas, frequent small volume, poorly formed stools, increased eructation and the stools have a more golden appearance than in the past. The stools are skinny compared to the past. Associated left lower abdominal cramping that occurs intermittently. No blood in the stool. Occasionally sees some mucous.    Appetite is good. She has lost weight since she stopped dairy.   There is a family history of colon polyps. Mother with precancerous polyps and ulcerative colitis. No known family history of colon cancer or polyps.     Her husband died of colon cancer 17 years ago.   Prior endoscopic history:  Colonoscopy 2000 -  hyperplastic polyp in the ascending colon Colonoscopy 2006 - normal Colonoscopy 2011 - normal  Colonoscopy with Dr. Allyn Kenner 09/18/14 - normal colonoscopy. Repeat in 5 years due to family history Past Medical History:  Diagnosis Date   Bladder problem    Hyperlipidemia    Insomnia    Migraines    Osteoporosis  Vitamin D deficiency     Past Surgical History:  Procedure Laterality Date   ADENOIDECTOMY     TUBAL LIGATION       Current Outpatient Medications  Medication Sig Dispense Refill   acyclovir (ZOVIRAX) 200 MG capsule Take 200 mg by mouth as needed.      Cholecalciferol (VITAMIN D3) 2000 UNITS TABS Take 1 tablet by mouth daily.     fluticasone (FLONASE) 50 MCG/ACT nasal spray as needed.   5    pregabalin (LYRICA) 150 MG capsule Take 1 capsule by mouth daily at 12 noon.     simvastatin (ZOCOR) 20 MG tablet Take 1 tablet (20 mg total) by mouth at bedtime. Pt needs to schedule f/u appt for further refills. 30 tablet 1   TURMERIC PO Take by mouth daily.     zinc gluconate 50 MG tablet Take by mouth daily. 2 tabs qd     zonisamide (ZONEGRAN) 100 MG capsule Take 1 capsule by mouth at bedtime.     No current facility-administered medications for this visit.    Allergies as of 05/20/2021 - Review Complete 05/20/2021  Allergen Reaction Noted   Aspirin  05/20/2021   Codeine Nausea Only     Family History  Problem Relation Age of Onset   Stroke Mother    Atrial fibrillation Mother    Heart failure Mother    Stroke Father    Heart failure Father    Hyperlipidemia Brother    Hypertension Brother    Heart disease Maternal Grandfather    Heart disease Paternal Grandmother    Heart disease Paternal Grandfather    Hyperlipidemia Sister    Hyperlipidemia Brother    Hypertension Brother      Review of Systems: 12 system ROS is negative except as noted above with the addition of headaches and sleeping problems.   Physical Exam: General:   Alert,  well-nourished, pleasant and cooperative in NAD Head:  Normocephalic and atraumatic. Eyes:  Sclera clear, no icterus.   Conjunctiva pink. Ears:  Normal auditory acuity. Nose:  No deformity, discharge,  or lesions. Mouth:  No deformity or lesions.   Neck:  Supple; no masses or thyromegaly. Lungs:  Clear throughout to auscultation.   No wheezes. Heart:  Regular rate and rhythm; no murmurs. Abdomen:  Soft, nontender, nondistended, normal bowel sounds, no rebound or guarding. No hepatosplenomegaly.   Rectal:  Deferred  Msk:  Symmetrical. No boney deformities LAD: No inguinal or umbilical LAD Extremities:  No clubbing or edema. Neurologic:  Alert and  oriented x4;  grossly nonfocal Skin:  Intact without significant lesions or  rashes. Psych:  Alert and cooperative. Normal mood and affect.    Derreck Wiltsey L. Tarri Glenn, MD, MPH 05/20/2021, 1:49 PM

## 2021-05-20 NOTE — Patient Instructions (Addendum)
It was a pleasure to meet you today.  Continue to avoid dairy.   I have recommend an upper endoscopy and colonoscopy for further evaluation.  In the meantime, you could try taking a daily probiotic to try to reset the the bacteria in the gut.  I have also recommended two weeks of antibiotics to try to reset the bacteria in the gut.  Tips for colonoscopy:  - Stay well hydrated for 3-4 days prior to the exam. This reduces nausea and dehydration.  - To prevent skin/hemorrhoid irritation - prior to wiping, put A&Dointment or vaseline on the toilet paper. - Keep a towel or pad on the bed.  - Drink  64oz of clear liquids in the morning of prep day (prior to starting the prep) to be sure that there is enough fluid to flush the colon and stay hydrated!!!! This is in addition to the fluids required for preparation. - Use of a flavored hard candy, such as grape Anise Salvo, can counteract some of the flavor of the prep and may prevent some nausea.    We have sent your demographic information and a prescription for Xifaxan to [facility]. This pharmacy is able to get medication approved through insurance and get you the lowest copay possible. If you have not heard from them within 1 week, please call our office at [phone] to let us know.    Thank you for trusting me with your gastrointestinal care!    Thornton Park, MD, MPH

## 2021-05-21 ENCOUNTER — Telehealth: Payer: Self-pay | Admitting: Gastroenterology

## 2021-05-21 NOTE — Telephone Encounter (Signed)
Inbound call from patient. Have questions about why Xifaxan was sent to [facility].

## 2021-05-21 NOTE — Telephone Encounter (Signed)
Explained on a voicemail the reason Rx was sent to [facility]

## 2021-06-02 DIAGNOSIS — M4156 Other secondary scoliosis, lumbar region: Secondary | ICD-10-CM | POA: Diagnosis not present

## 2021-06-02 DIAGNOSIS — M5416 Radiculopathy, lumbar region: Secondary | ICD-10-CM | POA: Diagnosis not present

## 2021-06-10 ENCOUNTER — Encounter: Payer: Self-pay | Admitting: Gastroenterology

## 2021-06-10 ENCOUNTER — Other Ambulatory Visit: Payer: Self-pay

## 2021-06-10 ENCOUNTER — Telehealth: Payer: Self-pay

## 2021-06-10 ENCOUNTER — Encounter: Payer: Medicare HMO | Admitting: Gastroenterology

## 2021-06-10 ENCOUNTER — Ambulatory Visit (AMBULATORY_SURGERY_CENTER): Payer: Medicare HMO | Admitting: Gastroenterology

## 2021-06-10 VITALS — BP 110/64 | HR 71 | Temp 98.0°F | Resp 15 | Ht 61.0 in | Wt 148.0 lb

## 2021-06-10 DIAGNOSIS — K449 Diaphragmatic hernia without obstruction or gangrene: Secondary | ICD-10-CM | POA: Diagnosis not present

## 2021-06-10 DIAGNOSIS — K2970 Gastritis, unspecified, without bleeding: Secondary | ICD-10-CM

## 2021-06-10 DIAGNOSIS — R1084 Generalized abdominal pain: Secondary | ICD-10-CM

## 2021-06-10 DIAGNOSIS — R194 Change in bowel habit: Secondary | ICD-10-CM

## 2021-06-10 DIAGNOSIS — D122 Benign neoplasm of ascending colon: Secondary | ICD-10-CM

## 2021-06-10 DIAGNOSIS — K259 Gastric ulcer, unspecified as acute or chronic, without hemorrhage or perforation: Secondary | ICD-10-CM | POA: Diagnosis not present

## 2021-06-10 DIAGNOSIS — R198 Other specified symptoms and signs involving the digestive system and abdomen: Secondary | ICD-10-CM

## 2021-06-10 MED ORDER — SODIUM CHLORIDE 0.9 % IV SOLN: 500.0000 mL | Freq: Once | INTRAVENOUS | Status: DC

## 2021-06-10 MED ORDER — PANTOPRAZOLE SODIUM 40 MG PO TBEC: 40.0000 mg | DELAYED_RELEASE_TABLET | Freq: Two times a day (BID) | ORAL | 0 refills | Status: DC

## 2021-06-10 NOTE — Progress Notes (Signed)
Called to room to assist during endoscopic procedure.  Patient ID and intended procedure confirmed with present staff. Received instructions for my participation in the procedure from the performing physician.  

## 2021-06-10 NOTE — Telephone Encounter (Signed)
Gen Surgery/Dr. Dema Severin amb referral placed and faxed to [facility]. All records faxed with Confirmation received.

## 2021-06-10 NOTE — Progress Notes (Signed)
Referring Provider: Reynold Bowen, MD Primary Care Physician:  Reynold Bowen, MD   Indications for Procedures:  Abdominal pain, change in bowel movements, and gas   IMPRESSION:  Change in bowel habits - now with more frequent stools with change in caliber Abdominal pain Lactose intolerance with some weight loss  Appropriate candidate for monitored anesthesia care in the [place] History of colon polyps on prior colonoscopy with Dr. Allyn Kenner  PLAN: - EGD with biopsies - Colonoscopy with random colon biopsies    HPI: Kylie Wheeler is a 75 y.o. female presents for endoscopic evaluation of abdominal pain, change in bowel habits, and gas. See my consult note from 05/20/21 for full details. There has been no change in history or physical exam.    Prior endoscopic history:  Colonoscopy 2000 -  hyperplastic polyp in the ascending colon Colonoscopy 2006 - normal Colonoscopy 2011 - normal  Colonoscopy with Dr. Allyn Kenner 09/18/14 - normal colonoscopy. Repeat in 5 years due to family history Past Medical History:  Diagnosis Date   Bladder problem    Hyperlipidemia    Insomnia    Migraines    Osteoporosis    Vitamin D deficiency     Past Surgical History:  Procedure Laterality Date   ADENOIDECTOMY     TUBAL LIGATION       Current Outpatient Medications  Medication Sig Dispense Refill   pregabalin (LYRICA) 150 MG capsule Take 1 capsule by mouth daily at 12 noon.     simvastatin (ZOCOR) 20 MG tablet Take 1 tablet (20 mg total) by mouth at bedtime. Pt needs to schedule f/u appt for further refills. 30 tablet 1   zonisamide (ZONEGRAN) 100 MG capsule Take 1 capsule by mouth at bedtime.     zonisamide (ZONEGRAN) 50 MG capsule Take by mouth.     acyclovir (ZOVIRAX) 200 MG capsule Take 200 mg by mouth as needed.      Cholecalciferol (VITAMIN D3) 2000 UNITS TABS Take 1 tablet by mouth daily.     fluticasone (FLONASE) 50 MCG/ACT nasal spray as needed.   5   rifaximin (XIFAXAN) 550 MG  TABS tablet Take 1 tablet (550 mg total) by mouth 2 (two) times daily. 20 tablet 0   TURMERIC PO Take by mouth daily.     zinc gluconate 50 MG tablet Take by mouth daily. 2 tabs qd     Current Facility-Administered Medications  Medication Dose Route Frequency Provider Last Rate Last Admin   0.9 %  sodium chloride infusion  500 mL Intravenous Once Thornton Park, MD        Allergies as of 06/10/2021 - Review Complete 06/10/2021  Allergen Reaction Noted   Aspirin  05/20/2021   Codeine Nausea Only     Family History  Problem Relation Age of Onset   Stroke Mother    Atrial fibrillation Mother    Heart failure Mother    Stroke Father    Heart failure Father    Hyperlipidemia Brother    Hypertension Brother    Heart disease Maternal Grandfather    Heart disease Paternal Grandmother    Heart disease Paternal Grandfather    Hyperlipidemia Sister    Hyperlipidemia Brother    Hypertension Brother      Physical Exam: General:   Alert,  well-nourished, pleasant and cooperative in NAD Head:  Normocephalic and atraumatic. Eyes:  Sclera clear, no icterus.   Conjunctiva pink. Ears:  Normal auditory acuity. Nose:  No deformity, discharge,  or lesions. Mouth:  No deformity or lesions.   Neck:  Supple; no masses or thyromegaly. Lungs:  Clear throughout to auscultation.   No wheezes. Heart:  Regular rate and rhythm; no murmurs. Abdomen:  Soft, nontender, nondistended, normal bowel sounds, no rebound or guarding. No hepatosplenomegaly.   Rectal:  Deferred  Msk:  Symmetrical. No boney deformities LAD: No inguinal or umbilical LAD Extremities:  No clubbing or edema. Neurologic:  Alert and  oriented x4;  grossly nonfocal Skin:  Intact without significant lesions or rashes. Psych:  Alert and cooperative. Normal mood and affect.    Delfin Squillace L. Tarri Glenn, MD, MPH 06/10/2021, 7:40 AM

## 2021-06-10 NOTE — Telephone Encounter (Signed)
-----   Message from Thornton Park, MD sent at 06/10/2021  8:42 AM EST ----- Good morning!  Referral to Dr. Nadeen Landau - perianal abnormality of unclear clinical significance  Thank you!

## 2021-06-10 NOTE — Progress Notes (Signed)
Pt in recovery with monitors in place, VSS. Report given to receiving RN. Bite guard was placed with pt awake to ensure comfort. No dental or soft tissue damage noted. 

## 2021-06-10 NOTE — Op Note (Signed)
Called to room.

## 2021-06-10 NOTE — Op Note (Addendum)
[facility] Patient Name: Kylie Wheeler Procedure Date: 06/10/2021 7:16 AM MRN: [id number] Endoscopist: Thornton Park MD, MD Age: 75 Referring MD:  Date of Birth: Dec 10, 1945 Gender: Female Account #: [id number] Procedure:                Upper GI endoscopy Indications:              Abdominal pain Medicines:                Monitored Anesthesia Care Procedure:                Pre-Anesthesia Assessment:                           - Prior to the procedure, a History and Physical                            was performed, and patient medications and                            allergies were reviewed. The patient's tolerance of                            previous anesthesia was also reviewed. The risks                            and benefits of the procedure and the sedation                            options and risks were discussed with the patient.                            All questions were answered, and informed consent                            was obtained. Prior Anticoagulants: The patient has                            taken no previous anticoagulant or antiplatelet                            agents. ASA Grade Assessment: II - A patient with                            mild systemic disease. After reviewing the risks                            and benefits, the patient was deemed in                            satisfactory condition to undergo the procedure.                           After obtaining informed consent, the endoscope was  passed under direct vision. Throughout the                            procedure, the patient's blood pressure, pulse, and                            oxygen saturations were monitored continuously. The                            Endoscope was introduced through the mouth, and                            advanced to the third part of duodenum. The upper                            GI endoscopy was accomplished  without difficulty.                            The patient tolerated the procedure well. Scope In: Scope Out: Findings:                 There is mild congestion at the Z-line, located 34                            cm from the incisors. Biopsies were taken with a                            cold forceps for histology. Estimated blood loss                            was minimal.                           Patchy moderate inflammation characterized by                            erosions, erythema, friability and granularity was                            found throughout the gastric body and antrum.                            Biopsies were taken from the antrum, body, and                            fundus with a cold forceps for histology. Estimated                            blood loss was minimal.                           A small hiatal hernia was present.                           The examined duodenum was  normal. Complications:            No immediate complications. Estimated blood loss:                            Minimal. Estimated Blood Loss:     Estimated blood loss was minimal. Impression:               - Z-line irregular, 34 cm from the incisors.                            Biopsied.                           - Gastritis. Biopsied.                           - Small hiatal hernia.                           - Normal examined duodenum. Recommendation:           - Patient has a contact number available for                            emergencies. The signs and symptoms of potential                            delayed complications were discussed with the                            patient. Return to normal activities tomorrow.                            Written discharge instructions were provided to the                            patient.                           - Resume previous diet.                           - Continue present medications.                           - Await  pathology results.                           - Avoid all NSAIDs.                           - Start pantoprazole 40 mg BID x 10 weeks.                           - Office follow-up in 6-8 weeks, earlier if needed. Thornton Park MD, MD 06/10/2021 8:22:43 AM This report has been signed electronically.

## 2021-06-10 NOTE — Progress Notes (Signed)
VS AJ   Pt's states no medical or surgical changes since previsit or office visit.

## 2021-06-10 NOTE — Op Note (Addendum)
[facility] Patient Name: Kylie Wheeler Procedure Date: 06/10/2021 7:16 AM MRN: [id number] Endoscopist: Thornton Park MD, MD Age: 75 Referring MD:  Date of Birth: 1946/05/26 Gender: Female Account #: [id number] Procedure:                Colonoscopy Indications:              Abdominal pain, Change in bowel habits, Change in                            stool caliber, Sensation of fullness at the rectum                           Endoscopic history:                           Colonoscopy 2000 - hyperplastic polyp in the                            ascending colon                           Colonoscopy 2006 - normal                           Colonoscopy 2011 - normal                           Colonoscopy 2016 - normal Medicines:                Monitored Anesthesia Care Procedure:                Pre-Anesthesia Assessment:                           - Prior to the procedure, a History and Physical                            was performed, and patient medications and                            allergies were reviewed. The patient's tolerance of                            previous anesthesia was also reviewed. The risks                            and benefits of the procedure and the sedation                            options and risks were discussed with the patient.                            All questions were answered, and informed consent                            was obtained. Prior Anticoagulants: The patient has  taken no previous anticoagulant or antiplatelet                            agents. ASA Grade Assessment: II - A patient with                            mild systemic disease. After reviewing the risks                            and benefits, the patient was deemed in                            satisfactory condition to undergo the procedure.                           After obtaining informed consent, the colonoscope                             was passed under direct vision. Throughout the                            procedure, the patient's blood pressure, pulse, and                            oxygen saturations were monitored continuously. The                            Olympus CF-HQ190L ([id number]) Colonoscope was                            introduced through the anus and advanced to the 3                            cm into the ileum. A second forward view of the                            right colon was performed. The colonoscopy was                            performed without difficulty. The patient tolerated                            the procedure well. The quality of the bowel                            preparation was good. The terminal ileum, ileocecal                            valve, appendiceal orifice, and rectum were                            photographed. Findings:                 The thick rubbery perianal  abnormality noted. The                            overlying skin appears normal. No obvious                            hemorrhoids.                           A few small-mouthed diverticula were found in the                            sigmoid colon.                           A 2-3 mm polyp was found in the ascending colon.                            The polyp was sessile. The polyp was removed with a                            cold snare. Resection and retrieval were complete.                            Estimated blood loss was minimal.                           The remainder of the examined colonic mucosa                            appeared normal. Biopsies were taken with a cold                            forceps for histology. Estimated blood loss was                            minimal.                           The terminal ileum appeared normal. Biopsies were                            taken with a cold forceps for histology. Estimated                            blood loss was minimal.                            The exam was otherwise without abnormality on                            direct and retroflexion views. Complications:            No immediate complications. Estimated blood loss:  Minimal. Estimated Blood Loss:     Estimated blood loss was minimal. Impression:               - Perianal skin abnormality found on perianal exam.                            This is of unclear clinical significance but                            causing local symptoms.                           - Diverticulosis in the sigmoid colon.                           - One 2-3 mm polyp in the ascending colon, removed                            with a cold snare. Resected and retrieved.                           - The entire examined colon is normal. Biopsied.                           - The examined portion of the ileum was normal.                            Biopsied.                           - The examination was otherwise normal on direct                            and retroflexion views. Recommendation:           - Patient has a contact number available for                            emergencies. The signs and symptoms of potential                            delayed complications were discussed with the                            patient. Return to normal activities tomorrow.                            Written discharge instructions were provided to the                            patient.                           - Resume previous diet. High protein diet  recommended.                           - Continue present medications.                           - Await pathology results.                           - Refer to a surgeon at the next available                            appointment.                           - Repeat colonoscopy for colon cancer screening is                            not recommended due to current age given your                             history of high quality colonoscopy.                           - Emerging evidence supports eating a diet of                            fruits, vegetables, grains, calcium, and yogurt                            while reducing red meat and alcohol may reduce the                            risk of colon cancer. Thornton Park MD, MD 06/10/2021 8:28:24 AM This report has been signed electronically.

## 2021-06-10 NOTE — Patient Instructions (Signed)
YOU HAD AN ENDOSCOPIC PROCEDURE TODAY: Refer to the procedure report and other information in the discharge instructions given to you for any specific questions about what was found during the examination. If this information does not answer your questions, please call [facility] office at [phone] to clarify.  ° °YOU SHOULD EXPECT: Some feelings of bloating in the abdomen. Passage of more gas than usual. Walking can help get rid of the air that was put into your GI tract during the procedure and reduce the bloating. If you had a lower endoscopy (such as a colonoscopy or flexible sigmoidoscopy) you may notice spotting of blood in your stool or on the toilet paper. Some abdominal soreness may be present for a day or two, also. ° °DIET: Your first meal following the procedure should be a light meal and then it is ok to progress to your normal diet. A half-sandwich or bowl of soup is an example of a good first meal. Heavy or fried foods are harder to digest and may make you feel nauseous or bloated. Drink plenty of fluids but you should avoid alcoholic beverages for 24 hours. If you had a esophageal dilation, please see attached instructions for diet.   ° °ACTIVITY: Your care partner should take you home directly after the procedure. You should plan to take it easy, moving slowly for the rest of the day. You can resume normal activity the day after the procedure however YOU SHOULD NOT DRIVE, use power tools, machinery or perform tasks that involve climbing or major physical exertion for 24 hours (because of the sedation medicines used during the test).  ° °SYMPTOMS TO REPORT IMMEDIATELY: °A gastroenterologist can be reached at any hour. Please call [phone]  for any of the following symptoms:  °Following lower endoscopy (colonoscopy, flexible sigmoidoscopy) °Excessive amounts of blood in the stool  °Significant tenderness, worsening of abdominal pains  °Swelling of the abdomen that is new, acute  °Fever of 100° or  higher  °Following upper endoscopy (EGD, EUS, ERCP, esophageal dilation) °Vomiting of blood or coffee ground material  °New, significant abdominal pain  °New, significant chest pain or pain under the shoulder blades  °Painful or persistently difficult swallowing  °New shortness of breath  °Black, tarry-looking or red, bloody stools ° °FOLLOW UP:  °If any biopsies were taken you will be contacted by phone or by letter within the next 1-3 weeks. Call [phone]  if you have not heard about the biopsies in 3 weeks.  °Please also call with any specific questions about appointments or follow up tests. ° °

## 2021-06-12 ENCOUNTER — Telehealth: Payer: Self-pay | Admitting: *Deleted

## 2021-06-12 ENCOUNTER — Telehealth: Payer: Self-pay

## 2021-06-12 NOTE — Telephone Encounter (Signed)
°  Follow up Call-  Call back number 06/10/2021  Post procedure Call Back phone  # [phone]  Permission to leave phone message Yes  Some recent data might be hidden     Patient questions:  Do you have a fever, pain , or abdominal swelling? No. Pain Score  0 *  Have you tolerated food without any problems? Yes.    Have you been able to return to your normal activities? Yes.    Do you have any questions about your discharge instructions: Diet   No. Medications  No. Follow up visit  No.  Do you have questions or concerns about your Care? No.  Actions: * If pain score is 4 or above: No action needed, pain <4.

## 2021-06-12 NOTE — Telephone Encounter (Signed)
°  Follow up Call-  Call back number 06/10/2021  Post procedure Call Back phone  # [phone]  Permission to leave phone message Yes  Some recent data might be hidden     Patient questions: Message left to call us if necessary.

## 2021-06-13 ENCOUNTER — Telehealth: Payer: Self-pay

## 2021-06-13 ENCOUNTER — Other Ambulatory Visit: Payer: Self-pay

## 2021-06-13 DIAGNOSIS — K6389 Other specified diseases of intestine: Secondary | ICD-10-CM

## 2021-06-13 MED ORDER — RIFAXIMIN 550 MG PO TABS: 550.0000 mg | ORAL_TABLET | Freq: Three times a day (TID) | ORAL | 0 refills | Status: DC

## 2021-06-13 MED ORDER — DOXYCYCLINE HYCLATE 100 MG PO TABS: 100.0000 mg | ORAL_TABLET | Freq: Two times a day (BID) | ORAL | 0 refills | Status: AC

## 2021-06-13 NOTE — Telephone Encounter (Signed)
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 Notes: Message from [employer] was Denied, please submit an electronic Appeal Request. You can also contact the plan at [phone] or fax in request to [phone].  Per Dr. Tarri Glenn orders, alternative sent to pt preferred pharmacy for doxycycline 100 mg BID x 14

## 2021-06-17 DIAGNOSIS — G43719 Chronic migraine without aura, intractable, without status migrainosus: Secondary | ICD-10-CM | POA: Diagnosis not present

## 2021-06-18 NOTE — Telephone Encounter (Signed)
Pt scheduled to be seen by Dr. Dema Severin 07/28/21.

## 2021-06-24 DIAGNOSIS — G43719 Chronic migraine without aura, intractable, without status migrainosus: Secondary | ICD-10-CM | POA: Diagnosis not present

## 2021-06-24 DIAGNOSIS — M542 Cervicalgia: Secondary | ICD-10-CM | POA: Diagnosis not present

## 2021-06-24 DIAGNOSIS — G518 Other disorders of facial nerve: Secondary | ICD-10-CM | POA: Diagnosis not present

## 2021-06-24 DIAGNOSIS — M791 Myalgia, unspecified site: Secondary | ICD-10-CM | POA: Diagnosis not present

## 2021-07-01 DIAGNOSIS — M2042 Other hammer toe(s) (acquired), left foot: Secondary | ICD-10-CM | POA: Diagnosis not present

## 2021-07-01 DIAGNOSIS — M21612 Bunion of left foot: Secondary | ICD-10-CM | POA: Diagnosis not present

## 2021-07-01 DIAGNOSIS — G8918 Other acute postprocedural pain: Secondary | ICD-10-CM | POA: Diagnosis not present

## 2021-07-01 DIAGNOSIS — M7742 Metatarsalgia, left foot: Secondary | ICD-10-CM | POA: Diagnosis not present

## 2021-07-01 HISTORY — PX: BUNIONECTOMY: SHX129

## 2021-07-17 ENCOUNTER — Ambulatory Visit: Payer: Medicare HMO | Admitting: Gastroenterology

## 2021-07-17 ENCOUNTER — Other Ambulatory Visit: Payer: Medicare HMO

## 2021-07-17 ENCOUNTER — Encounter: Payer: Self-pay | Admitting: Gastroenterology

## 2021-07-17 VITALS — BP 154/72 | HR 72

## 2021-07-17 DIAGNOSIS — R1084 Generalized abdominal pain: Secondary | ICD-10-CM

## 2021-07-17 DIAGNOSIS — K6389 Other specified diseases of intestine: Secondary | ICD-10-CM | POA: Diagnosis not present

## 2021-07-17 DIAGNOSIS — K638219 Small intestinal bacterial overgrowth, unspecified: Secondary | ICD-10-CM

## 2021-07-17 DIAGNOSIS — R194 Change in bowel habit: Secondary | ICD-10-CM | POA: Diagnosis not present

## 2021-07-17 DIAGNOSIS — R140 Abdominal distension (gaseous): Secondary | ICD-10-CM

## 2021-07-17 NOTE — Progress Notes (Signed)
Referring Provider: Reynold Bowen, MD Primary Care Physician:  Reynold Bowen, MD   Reason for Consultation:  Flatus   IMPRESSION:  Suspected SIBO presenting with abdominal pain, change in bowel habits, and bloating. Clinically improved after 14 days of Xifaxan except for persistent flatus. Considering food intolerance including carbohydrate intolerance, persistent SIBO, and celiac. No alarm features.  Marland Kitchenklbfla  History of colon polyps on prior colonoscopy with Dr. Allyn Kenner: Tubular adenoma removed on colonoscopy 12/22.  Discussed these results today.  No surveillance recommended given her age.  However, she would like to consider colonoscopy in 7 years if it is clinically appropriate at that time.  PLAN: - She will keep a food diary to try to identify triggering foods - Add a daily probiotics - discussed Align - TTGA, IgA - SIBO breath test - Follow-up in 6-8 weeks, consider low FODMAP diet at that time if symptoms have not improved  I spent 45 minutes, including in depth chart review, independent review of results, face-to-face time with the patient, coordinating care, ordering studies and medications as appropriate, and documentation.     HPI: Kylie Wheeler is a 76 y.o. female who returns in follow-up after her consultation appointment for abdominal pain, change in bowel habits, and gas. Endoscopic evaluation was performed 06/10/21. She returns today in scheduled follow-up. The interval history is obtained through the patient and review of her electronic health record.  She has a history of migraines, neck arthritis, right knee meniscal tear, scoliosis, and adenomatous polyps. She was diagnosed with ulcerative colitis in her 19s but is not aware of any additional findings since that time.    The patient was last seen by Dr. Allyn Kenner 02/26/2021 for 6 months of frequent bowel movements, flatus, and abdominal pain. She is most bothered by the gas and frequent bowel movements.  She was  having 3-4 urgent, formed to soft stools daily, with passage of excessive rectal gas and belching.  She associated her symptoms with consumption of sweets, particularly ice cream.  There is epigastric pain which she describes as a "sour" feeling.  Pain is improved with use of antacid and with meals.  No specific time when her symptoms are most prominent nor do they awaken her at night.  No nausea, vomiting, or weight loss.  She has chronic back pain for which she takes gabapentin. Symptoms attributed to dyspepsia.  Endoscopic evaluation recommended.  Empiric treatment with famotidine 40 mg nightly was offered but the patient was concerned about masking underlying pathology. Some syptoms thought to be due to lactose intolerance.   At the time of her consultation appointment 05/20/2021 she was seeking a second opinion, reporting a lump below the sternum, change in bowel movements to think stools, and gas that improved since she stopped dairy.  Bowel habits had changed 6 months prior with associated gas, frequent small volume, poorly formed stools, increased eructation and the stools have a more golden appearance than in the past. The stools are skinny compared to the past. Associated left lower abdominal cramping that occurs intermittently. No blood in the stool. Occasionally sees some mucous. Appetite was good. She had lost weight since she stopped dairy.   EGD 06/10/2021: Distal esophageal congestion, patchy gastritis, small hiatal hernia    - Path: Focal erosion and reactive changes, normal esophageal biopsies Colonoscopy 06/10/2021: Thick rubbery perianal abnormality, sigmoid diverticulosis, a small ascending colon polyp.    - Path: Tubular adenoma, normal colonic mucosa  She returns today in scheduled follow-up.  All  symptoms resolved after completing 14 days of doxycycline for possible SIBO. Xifaxan was cost prohibitive. The cramping and diarrhea have improved.  However, she feels like she still has  ongoing gas. It's not as bad as it was, but, she remains bothered by it.  Symptoms triggered by dairy, gluten, sugar.  There is no constipation.  She continues on pantoprazole for her diagnosis on gastritis.    There is a family history of colon polyps. Mother with precancerous polyps and ulcerative colitis. No known family history of colon cancer or polyps.     Her husband died of colon cancer 17 years ago.   Prior endoscopic history:  Colonoscopy 2000 -  hyperplastic polyp in the ascending colon Colonoscopy 2006 - normal Colonoscopy 2011 - normal  Colonoscopy with Dr. Allyn Kenner 09/18/14 - normal colonoscopy. Repeat in 5 years due to family history Past Medical History:  Diagnosis Date   Bladder problem    Hyperlipidemia    Insomnia    Migraines    Osteoporosis    Vitamin D deficiency     Past Surgical History:  Procedure Laterality Date   ADENOIDECTOMY     BUNIONECTOMY Left 07/01/2021   TUBAL LIGATION       Current Outpatient Medications  Medication Sig Dispense Refill   acyclovir (ZOVIRAX) 200 MG capsule Take 200 mg by mouth as needed.      Cholecalciferol (VITAMIN D3) 2000 UNITS TABS Take 1 tablet by mouth daily.     escitalopram (LEXAPRO) 20 MG tablet Take 20 mg by mouth daily.     fluticasone (FLONASE) 50 MCG/ACT nasal spray as needed.   5   pantoprazole (PROTONIX) 40 MG tablet Take 1 tablet (40 mg total) by mouth 2 (two) times daily. Take 1 tab 2 x per day for 10 weeks 140 tablet 0   pregabalin (LYRICA) 150 MG capsule Take 1 capsule by mouth daily at 12 noon.     simvastatin (ZOCOR) 20 MG tablet Take 1 tablet (20 mg total) by mouth at bedtime. Pt needs to schedule f/u appt for further refills. 30 tablet 1   TURMERIC PO Take by mouth daily.     zinc gluconate 50 MG tablet Take by mouth daily. 2 tabs qd     zonisamide (ZONEGRAN) 50 MG capsule Take 150 mg by mouth daily.     ondansetron (ZOFRAN) 4 MG tablet Take 4 mg by mouth as needed. (Patient not taking: Reported on  07/17/2021)     oxyCODONE (OXY IR/ROXICODONE) 5 MG immediate release tablet Take by mouth as needed. (Patient not taking: Reported on 07/17/2021)     No current facility-administered medications for this visit.    Allergies as of 07/17/2021 - Review Complete 07/17/2021  Allergen Reaction Noted   Aspirin  05/20/2021   Lactose  07/17/2021   Codeine Nausea Only     Family History  Problem Relation Age of Onset   Stroke Mother    Atrial fibrillation Mother    Heart failure Mother    Stroke Father    Heart failure Father    Hyperlipidemia Brother    Hypertension Brother    Heart disease Maternal Grandfather    Heart disease Paternal Grandmother    Heart disease Paternal Grandfather    Hyperlipidemia Sister    Hyperlipidemia Brother    Hypertension Brother     Physical Exam: Gen: Awake, alert, and oriented, and well communicative. HEENT: EOMI, non-icteric sclera, NCAT, MMM  Neck: Normal movement of head and neck  Pulm:  No labored breathing, speaking in full sentences without conversational dyspnea  Derm: No apparent lesions or bruising in visible field  MS: Moves all visible extremities without noticeable abnormality  Psych: Pleasant, cooperative, normal speech, thought processing seemingly intact      Kylie Wheeler L. Tarri Glenn, MD, MPH 07/17/2021, 9:17 AM

## 2021-07-17 NOTE — Patient Instructions (Addendum)
It was a pleasure to see you today.  I am delighted that you are feeling better after the 2 weeks of antibiotics. But, it sounds like we have some work to do on your gas.  I have recommended a breath test to evaluate for bacterial overgrowth.  You have been given a testing kit to check for small intestine bacterial overgrowth (SIBO) which is completed by a company named [facility]. Make sure to return your test in the mail using the return mailing label given to you along with the kit. Your demographic and insurance information have already been sent to the company and they should be in contact with you over the next week regarding this test. [facility] will collect an upfront charge of $99.74 for commercial insurance plans and $209.74 is you are paying cash. Make sure to discuss with [facility] PRIOR to having the test if they have gotten informatoin from your insurance company as to how much your testing will cost out of pocket, if any. Please keep in mind that you will be getting a call from phone number [phone] or a similar number. If you do not hear from them within this time frame, please call our office at [phone].    I am also recommending labs for celiac disease. Please stop in the lab on the way out.  Your provider has requested that you go to the basement level for lab work before leaving today. Press "B" on the elevator. The lab is located at the first door on the left as you exit the elevator.   A food diary may be very helpful to identify foods that are triggering your gas.  Foods that are associated with causing gas include beans and lentils, bran, dairy products, fructose, sorbitol and other sugar substitutes, and carbonated beverages.  Avoiding these foods may improve your gas.  Probiotics: Probiotics can modify the gut microbiome and improve bloating. We do not have strong data to support the use of probiotics, or a specific probiotic, but, there is little  risk to trying. The probiotic that we discussed was Electronics engineer.   I would like to see you back in 6-8 weeks, earlier if necessary. Follow up on:

## 2021-07-18 ENCOUNTER — Encounter: Payer: Self-pay | Admitting: Gastroenterology

## 2021-07-18 LAB — IGA: Immunoglobulin A: 71 mg/dL (ref 70–320)

## 2021-07-18 LAB — TISSUE TRANSGLUTAMINASE, IGA: (tTG) Ab, IgA: <1 U/mL

## 2021-07-23 ENCOUNTER — Encounter: Payer: Self-pay | Admitting: Gastroenterology

## 2021-08-11 DIAGNOSIS — M21612 Bunion of left foot: Secondary | ICD-10-CM | POA: Diagnosis not present

## 2021-08-11 DIAGNOSIS — M79672 Pain in left foot: Secondary | ICD-10-CM | POA: Diagnosis not present

## 2021-08-11 DIAGNOSIS — Z4889 Encounter for other specified surgical aftercare: Secondary | ICD-10-CM | POA: Diagnosis not present

## 2021-09-04 ENCOUNTER — Encounter (HOSPITAL_COMMUNITY): Payer: Medicare HMO

## 2021-09-05 ENCOUNTER — Other Ambulatory Visit (HOSPITAL_COMMUNITY): Payer: Self-pay | Admitting: *Deleted

## 2021-09-08 ENCOUNTER — Other Ambulatory Visit: Payer: Self-pay

## 2021-09-08 ENCOUNTER — Ambulatory Visit (HOSPITAL_COMMUNITY)
Admission: RE | Admit: 2021-09-08 | Discharge: 2021-09-08 | Disposition: A | Discharge status: Home / Self Care | Payer: Medicare HMO | Source: Ambulatory Visit | Attending: Endocrinology | Admitting: Endocrinology

## 2021-09-08 DIAGNOSIS — M810 Age-related osteoporosis without current pathological fracture: Secondary | ICD-10-CM | POA: Diagnosis not present

## 2021-09-08 MED ORDER — DENOSUMAB 60 MG/ML ~~LOC~~ SOSY
60.0000 mg | PREFILLED_SYRINGE | Freq: Once | SUBCUTANEOUS | Status: AC
  Administered 2021-09-08: 60 mg via SUBCUTANEOUS

## 2021-09-08 MED ORDER — DENOSUMAB 60 MG/ML ~~LOC~~ SOSY
PREFILLED_SYRINGE | SUBCUTANEOUS | Status: AC
  Filled 2021-09-08: qty 1

## 2021-09-11 DIAGNOSIS — M5416 Radiculopathy, lumbar region: Secondary | ICD-10-CM | POA: Diagnosis not present

## 2021-09-12 ENCOUNTER — Encounter: Payer: Self-pay | Admitting: Gastroenterology

## 2021-09-12 ENCOUNTER — Ambulatory Visit: Payer: Medicare HMO | Admitting: Gastroenterology

## 2021-09-12 VITALS — BP 118/74 | HR 70 | Ht 61.0 in

## 2021-09-12 DIAGNOSIS — R194 Change in bowel habit: Secondary | ICD-10-CM

## 2021-09-12 DIAGNOSIS — R140 Abdominal distension (gaseous): Secondary | ICD-10-CM | POA: Diagnosis not present

## 2021-09-12 DIAGNOSIS — R1084 Generalized abdominal pain: Secondary | ICD-10-CM

## 2021-09-12 NOTE — Patient Instructions (Addendum)
It was a pleasure to see you today. ? ?Please do the breath test! I am hoping that it will give [place] some ideas about how to make you feel better. Let me know after you submit the test.  ? ?Keep track of the foods that seem to trigger symptoms. Some people find that a written record, or food journal, helps them identify problem foods. Record details such as what you eat, what time of day, and how much and how you feel afterward. By avoiding foods that seem to cause discomfort, you may be able to reduce symptoms.  ? ?A common dietary guideline for people suffering from IBS and bloating is a diet known as FODMAP. This stands for "fermentable oligodi-monosaccharides and polyols," or, more simply, certain types of carbohydrates found in foods that are hard to digest. By following FODMAP, also known as a low-FODMAP diet, you avoid or limit these particular carbohydrates. Some of the foods that contain FODMAPs include: ?- Fruits such as apples, apricots, blackberries, cherries, mango, nectarines, pears, plums, and watermelon, or juice containing any of these fruits ?- Canned fruit in natural fruit juice, or large quantities of fruit juice or dried fruit ?- Vegentables such as artichokes, asparagus, beans, cabbage, cauliflower, garlic and garlic salts, lentils, mushrooms, onions, and sugar snap or snow peas ?- Dairy products such as milk, milk products, soft cheese, yogurt, custard, and ice cream ?- Wheat and rye products ?- Honey and foods with high-fructose corn syrup  ?- Products, including candy and gum, with sweeteners ending in "-ol" (for example, sorbitol, mannitol, xylitol, and maltitol) ? ?My favorite websites for more information include: ?[employer]ic[employer].se ? ?We also discussed trying the Nerva hypotherapy which might also help. If you try this, please let me know.  ?  ?We discussed tapering your pantoprazole. If you don't notice any difference, please stop it all together.  ? ?We are giving you a Low  FOD-MAP diet today to follow. ? ?Please call us once you have submitted the SIBO test.  ? ?Thank you for entrusting me with your care and for choosing [employer], ?Dr. Thornton Park ?  ?

## 2021-09-12 NOTE — Progress Notes (Signed)
? ?Referring Provider: Reynold Bowen, MD ?Primary Care Physician:  Reynold Bowen, MD ? ? ?Reason for Consultation:  Bloating, gas, and soft stools ? ? ?IMPRESSION:  ?Suspected SIBO presenting with abdominal pain, change in bowel habits, and bloating. Known lactose intolerance. Clinically improved after 14 days of Xifaxan except for persistent flatus. But, symptoms returned. Considering food intolerance including carbohydrate intolerance and persistent SIBO. Recent celiac testing was negative. No alarm features. I recommended SIBO breath test to evaluate for methane bacterial overgrowth.  ? ?Lactose intolerance. Will continue to avoid lactose free foods and try to use lactose-free medications.  ? ?History of colon polyps on prior colonoscopy with Dr. Allyn Kenner: Tubular adenoma removed on colonoscopy 12/22.  Discussed these results today.  No surveillance recommended given her age.  However, she would like to consider colonoscopy in 7 years if it is clinically appropriate at that time. ? ?Requests pills instead of capsules whenever possible  ? ?PLAN: ?- She will keep a food diary to try to identify triggering foods ?- SIBO breath test ?- Briefly discussed low FODMAP diet and [employer] hypotherapy app as alternative ways to manage symptoms. FODBAP brochure provided.  ? ?I spent 25 minutes, including independent review of results, communicating results with the patient directly, face-to-face time with the patient, coordinating care, ordering studies and medications as appropriate, and documentation.    ? ? ?HPI: Kylie Wheeler is a 76 y.o. female who returns in follow-up. She was last seen 07/17/21  for abdominal pain, change in bowel habits, and gas. Endoscopic evaluation was performed 06/10/21. She returns today in scheduled follow-up. The interval history is obtained through the patient and review of her electronic health record.  She has a history of migraines, neck arthritis, right knee meniscal tear, scoliosis, and  adenomatous polyps. She is a binge eater. She was diagnosed with ulcerative colitis in her 71s but is not aware of any additional findings since that time.  ?  ?The patient was last seen by Dr. Allyn Kenner 02/26/2021 for 6 months of frequent bowel movements, flatus, and abdominal pain. She is most bothered by the gas and frequent bowel movements.  She was having 3-4 urgent, formed to soft stools daily, with passage of excessive rectal gas and belching.  She associated her symptoms with consumption of sweets, particularly ice cream.  There is epigastric pain which she describes as a "sour" feeling.  Pain is improved with use of antacid and with meals.  No specific time when her symptoms are most prominent nor do they awaken her at night.  No nausea, vomiting, or weight loss.  She has chronic back pain for which she takes gabapentin. Symptoms attributed to dyspepsia.  Endoscopic evaluation recommended.  Empiric treatment with famotidine 40 mg nightly was offered but the patient was concerned about masking underlying pathology. Some syptoms thought to be due to lactose intolerance.  ? ?At the time of her consultation appointment 05/20/2021 she was seeking a second opinion, reporting a lump below the sternum, change in bowel movements to think stools, and gas that improved since she stopped dairy.  Bowel habits had changed 6 months prior with associated gas, frequent small volume, poorly formed stools, increased eructation and the stools have a more golden appearance than in the past. The stools are skinny compared to the past. Associated left lower abdominal cramping that occurs intermittently. No blood in the stool. Occasionally sees some mucous. Appetite was good. She had lost weight since she stopped dairy.  ? ?EGD 06/10/2021: Distal  esophageal congestion, patchy gastritis, small hiatal hernia ?   - Path: Focal erosion and reactive changes, normal esophageal biopsies ?Colonoscopy 06/10/2021: Thick rubbery perianal  abnormality, sigmoid diverticulosis, a small ascending colon polyp. ?   - Path: Tubular adenoma, normal colonic mucosa ? ?On follow-up 07/17/21 she reported that all symptoms resolved after completing 14 days of doxycycline for possible SIBO. Xifaxan was cost prohibitive. The cramping and diarrhea have improved.  However, she feels like she still has ongoing gas. It's not as bad as it was, but, she remains bothered by it.  Symptoms triggered by dairy, gluten, sugar.  There is no constipation. ? ?She continues on pantoprazole for her diagnosis on gastritis.   ? ?Labs 07/17/21 showed TTGA <1.0, IgA 71 ? ?She returns today in scheduled follow-up. She has not yet completed the SIBO breath test as she thought that was the celiac testing and she elected to try to avoid gluten in her diet, instead. Her symptoms are unchanged. She remains bothered by the gas. She is trying to identify foods beyond lactose that trigger her symptoms.  ? ?There is a family history of colon polyps. Mother with precancerous polyps and ulcerative colitis. No known family history of colon cancer or polyps.    ? ?Her husband died of colon cancer 17 years ago.  ? ?TTGA <1.0, IgA 71 ? ?Prior endoscopic history:  ?Colonoscopy 2000 -  hyperplastic polyp in the ascending colon ?Colonoscopy 2006 - normal ?Colonoscopy 2011 - normal  ?Colonoscopy with Dr. Allyn Kenner 09/18/14 - normal colonoscopy. Repeat in 5 years due to family history ?Past Medical History:  ?Diagnosis Date  ? Bladder problem   ? Hyperlipidemia   ? Insomnia   ? Migraines   ? Osteoporosis   ? Vitamin D deficiency   ? ? ?Past Surgical History:  ?Procedure Laterality Date  ? ADENOIDECTOMY    ? BUNIONECTOMY Left 07/01/2021  ? TUBAL LIGATION    ? ? ? ?Current Outpatient Medications  ?Medication Sig Dispense Refill  ? acyclovir (ZOVIRAX) 200 MG capsule Take 200 mg by mouth as needed.     ? Cholecalciferol (VITAMIN D3) 2000 UNITS TABS Take 1 tablet by mouth daily.    ? escitalopram (LEXAPRO) 20 MG tablet  Take 20 mg by mouth daily.    ? fluticasone (FLONASE) 50 MCG/ACT nasal spray as needed.   5  ? pregabalin (LYRICA) 150 MG capsule Take 1 capsule by mouth daily at 12 noon.    ? simvastatin (ZOCOR) 20 MG tablet Take 1 tablet (20 mg total) by mouth at bedtime. Pt needs to schedule f/u appt for further refills. 30 tablet 1  ? TURMERIC PO Take by mouth daily.    ? zinc gluconate 50 MG tablet Take by mouth daily. 2 tabs qd    ? zonisamide (ZONEGRAN) 50 MG capsule Take 150 mg by mouth daily.    ? pantoprazole (PROTONIX) 40 MG tablet Take 1 tablet (40 mg total) by mouth 2 (two) times daily. Take 1 tab 2 x per day for 10 weeks 140 tablet 0  ? ?No current facility-administered medications for this visit.  ? ? ?Allergies as of 09/12/2021 - Review Complete 09/08/2021  ?Allergen Reaction Noted  ? Aspirin  05/20/2021  ? Lactose  07/17/2021  ? Codeine Nausea Only   ? ? ?Family History  ?Problem Relation Age of Onset  ? Stroke Mother   ? Atrial fibrillation Mother   ? Heart failure Mother   ? Stroke Father   ? Heart  failure Father   ? Hyperlipidemia Brother   ? Hypertension Brother   ? Heart disease Maternal Grandfather   ? Heart disease Paternal Grandmother   ? Heart disease Paternal Grandfather   ? Hyperlipidemia Sister   ? Hyperlipidemia Brother   ? Hypertension Brother   ? ? ?Physical Exam: ?Gen: Awake, alert, and oriented, and well communicative. ?HEENT: EOMI, non-icteric sclera, NCAT, MMM  ?Neck: Normal movement of head and neck  ?Pulm: No labored breathing, speaking in full sentences without conversational dyspnea  ?Derm: No apparent lesions or bruising in visible field  ?MS: Moves all visible extremities without noticeable abnormality  ?Psych: Pleasant, cooperative, normal speech, thought processing seemingly intact   ? ? ? ?Keigen Caddell L. Tarri Glenn, MD, MPH ?09/12/2021, 1:41 PM ? ? ? ?  ?

## 2021-09-25 DIAGNOSIS — R100 Acute abdomen: Secondary | ICD-10-CM | POA: Diagnosis not present

## 2021-09-25 DIAGNOSIS — R140 Abdominal distension (gaseous): Secondary | ICD-10-CM | POA: Diagnosis not present

## 2021-09-28 ENCOUNTER — Encounter: Payer: Self-pay | Admitting: Gastroenterology

## 2021-09-30 DIAGNOSIS — M5416 Radiculopathy, lumbar region: Secondary | ICD-10-CM | POA: Diagnosis not present

## 2021-10-01 NOTE — Telephone Encounter (Signed)
Thanks

## 2021-10-01 NOTE — Telephone Encounter (Signed)
Results on your desk to review next week once you have returned. ?

## 2021-10-02 ENCOUNTER — Telehealth: Payer: Self-pay | Admitting: Gastroenterology

## 2021-10-02 DIAGNOSIS — K6389 Other specified diseases of intestine: Secondary | ICD-10-CM

## 2021-10-02 DIAGNOSIS — R194 Change in bowel habit: Secondary | ICD-10-CM

## 2021-10-02 DIAGNOSIS — R140 Abdominal distension (gaseous): Secondary | ICD-10-CM

## 2021-10-02 DIAGNOSIS — M79672 Pain in left foot: Secondary | ICD-10-CM | POA: Diagnosis not present

## 2021-10-02 DIAGNOSIS — R1084 Generalized abdominal pain: Secondary | ICD-10-CM

## 2021-10-02 DIAGNOSIS — Z4889 Encounter for other specified surgical aftercare: Secondary | ICD-10-CM | POA: Diagnosis not present

## 2021-10-02 DIAGNOSIS — M21612 Bunion of left foot: Secondary | ICD-10-CM | POA: Diagnosis not present

## 2021-10-02 MED ORDER — RIFAXIMIN 550 MG PO TABS: 550.0000 mg | ORAL_TABLET | Freq: Three times a day (TID) | ORAL | 0 refills | Status: AC

## 2021-10-02 NOTE — Addendum Note (Signed)
Addended by: Hardie Pulley, Ladye Macnaughton J on: 10/02/2021 11:01 AM ? ? Modules accepted: Orders ? ?

## 2021-10-02 NOTE — Telephone Encounter (Signed)
Lactulose SIBO breath test positive.  Increase in hydrogen 18 ppm, increase in methane 5 ppm for an increase in combined hydrogen and methane of 23 ppm. ? ?Given these results I recommend that the patient repeat Xifaxan 550 mg 3 times daily for 14 days.  Please plan on office visit 2 to 4 weeks following completion of antibiotics to monitor her response to therapy. ? ?Thank you. ?

## 2021-10-02 NOTE — Telephone Encounter (Signed)
Called pt and informed of results and recommendations as reviewed and documented by Dr. Tarri Glenn. Verbalized acceptance and understanding.Rx sent to pt preferred pharmacy. Pt scheduled for f/u appt 10/27/21 @ 1110. ?

## 2021-10-05 ENCOUNTER — Encounter: Payer: Self-pay | Admitting: Gastroenterology

## 2021-10-06 NOTE — Telephone Encounter (Signed)
Xifaxan unaffordable. Routing this message to Dr. Tarri Glenn for her to address upon her return 4/25 ?

## 2021-10-07 ENCOUNTER — Other Ambulatory Visit: Payer: Self-pay

## 2021-10-07 DIAGNOSIS — R194 Change in bowel habit: Secondary | ICD-10-CM

## 2021-10-07 DIAGNOSIS — K638219 Small intestinal bacterial overgrowth, unspecified: Secondary | ICD-10-CM

## 2021-10-07 DIAGNOSIS — K6389 Other specified diseases of intestine: Secondary | ICD-10-CM

## 2021-10-07 DIAGNOSIS — R1084 Generalized abdominal pain: Secondary | ICD-10-CM

## 2021-10-07 DIAGNOSIS — R140 Abdominal distension (gaseous): Secondary | ICD-10-CM

## 2021-10-07 MED ORDER — DOXYCYCLINE HYCLATE 100 MG PO TABS: 100.0000 mg | ORAL_TABLET | Freq: Two times a day (BID) | ORAL | 0 refills | Status: AC

## 2021-10-08 DIAGNOSIS — M25512 Pain in left shoulder: Secondary | ICD-10-CM | POA: Diagnosis not present

## 2021-10-08 DIAGNOSIS — M47812 Spondylosis without myelopathy or radiculopathy, cervical region: Secondary | ICD-10-CM | POA: Diagnosis not present

## 2021-10-09 DIAGNOSIS — G43719 Chronic migraine without aura, intractable, without status migrainosus: Secondary | ICD-10-CM | POA: Diagnosis not present

## 2021-10-09 DIAGNOSIS — M542 Cervicalgia: Secondary | ICD-10-CM | POA: Diagnosis not present

## 2021-10-09 DIAGNOSIS — M791 Myalgia, unspecified site: Secondary | ICD-10-CM | POA: Diagnosis not present

## 2021-10-09 DIAGNOSIS — G518 Other disorders of facial nerve: Secondary | ICD-10-CM | POA: Diagnosis not present

## 2021-10-10 DIAGNOSIS — H5711 Ocular pain, right eye: Secondary | ICD-10-CM | POA: Diagnosis not present

## 2021-10-10 DIAGNOSIS — H04123 Dry eye syndrome of bilateral lacrimal glands: Secondary | ICD-10-CM | POA: Diagnosis not present

## 2021-10-27 ENCOUNTER — Encounter: Payer: Self-pay | Admitting: Gastroenterology

## 2021-10-27 ENCOUNTER — Ambulatory Visit: Payer: Medicare HMO | Admitting: Gastroenterology

## 2021-10-27 ENCOUNTER — Telehealth: Payer: Self-pay | Admitting: Gastroenterology

## 2021-10-27 VITALS — BP 128/80 | HR 56 | Ht 61.0 in | Wt 145.0 lb

## 2021-10-27 DIAGNOSIS — R140 Abdominal distension (gaseous): Secondary | ICD-10-CM | POA: Diagnosis not present

## 2021-10-27 DIAGNOSIS — R143 Flatulence: Secondary | ICD-10-CM

## 2021-10-27 MED ORDER — DICYCLOMINE HCL 10 MG PO CAPS: 10.0000 mg | ORAL_CAPSULE | Freq: Three times a day (TID) | ORAL | 1 refills | Status: DC

## 2021-10-27 MED ORDER — DOXYCYCLINE HYCLATE 100 MG PO CAPS
100.0000 mg | ORAL_CAPSULE | Freq: Two times a day (BID) | ORAL | 0 refills | Status: DC
Start: 2021-10-27 — End: 2021-10-27

## 2021-10-27 MED ORDER — DOXYCYCLINE HYCLATE 100 MG PO TABS: 100.0000 mg | ORAL_TABLET | Freq: Two times a day (BID) | ORAL | 0 refills | Status: AC

## 2021-10-27 NOTE — Progress Notes (Signed)
Referring Provider: Reynold Bowen, MD Primary Care Physician:  Reynold Bowen, MD   Chief Complaint:  Bloating and gas   IMPRESSION:  Suspected SIBO presenting with abdominal pain, change in bowel habits, and bloating. Known lactose intolerance. Clinically improved after 14 days of Xifaxan except for persistent flatus. But, symptoms returned. Breath test confirmed ongoing SIBO but no methane bacterial overgrowth. But, symptoms returned. Considering concurrent food intolerance including carbohydrate intolerance and persistent SIBO. Recent celiac testing was negative. No alarm features.   Lactose intolerance. Will continue to avoid lactose free foods and try to use lactose-free medications.   History of colon polyps on prior colonoscopy with Dr. Allyn Kenner: Tubular adenoma removed on colonoscopy 12/22.  Discussed these results today.  No surveillance recommended given her age.  However, she would like to consider colonoscopy in 7 years if it is clinically appropriate at that time.  Requests pills instead of capsules whenever possible.   PLAN: - She will keep a food diary to try to identify triggering foods - Xifaxan 550 mg TID x 14 days, substitute with doxycycline 100 mg BID if Xifaxan if cost prohibitive - Dicyclomine 10 mg QID taken prior to meals  I spent 25 minutes, including independent review of results, communicating results with the patient directly, face-to-face time with the patient, coordinating care, ordering studies and medications as appropriate, and documentation.      HPI: Kylie Wheeler is a 76 y.o. female who returns in follow-up. She was last seen 09/12/21  for bloating, gas, abdominal pain, and change in bowel habits. She returns today in scheduled follow-up. The interval history is obtained through the patient and review of her electronic health record.  She has a history of migraines, neck arthritis, right knee meniscal tear, scoliosis, and adenomatous polyps. She is  a binge eater. She was diagnosed with ulcerative colitis in her 64s but is not aware of any additional findings since that time.    The patient previously followed by Dr. Allyn Kenner, last seen 02/26/2021 for 6 months of frequent bowel movements, flatus, and abdominal pain. She is most bothered by the gas and frequent bowel movements.  She was having 3-4 urgent, formed to soft stools daily, with passage of excessive rectal gas and belching.  She associated her symptoms with consumption of sweets, particularly ice cream.  There is epigastric pain which she describes as a "sour" feeling.  Pain is improved with use of antacid and with meals.  No specific time when her symptoms are most prominent nor do they awaken her at night.  No nausea, vomiting, or weight loss.  She has chronic back pain for which she takes gabapentin. Symptoms attributed to dyspepsia.  Endoscopic evaluation recommended.  Empiric treatment with famotidine 40 mg nightly was offered but the patient was concerned about masking underlying pathology. Some syptoms thought to be due to lactose intolerance.   At the time of her consultation appointment 05/20/2021 she was seeking a second opinion, reporting a lump below the sternum, change in bowel movements to think stools, and gas that improved since she stopped dairy.  Bowel habits had changed 6 months prior with associated gas, frequent small volume, poorly formed stools, increased eructation and the stools have a more golden appearance than in the past. The stools are skinny compared to the past. Associated left lower abdominal cramping that occurs intermittently. No blood in the stool. Occasionally sees some mucous. Appetite was good. She had lost weight since she stopped dairy.   EGD 06/10/2021:  Distal esophageal congestion, patchy gastritis, small hiatal hernia    - Path: Focal erosion and reactive changes, normal esophageal biopsies Colonoscopy 06/10/2021: Thick rubbery perianal abnormality,  sigmoid diverticulosis, a small ascending colon polyp.    - Path: Tubular adenoma, normal colonic mucosa  On follow-up 07/17/21 she reported that all symptoms resolved after completing 14 days of doxycycline for possible SIBO. Xifaxan was cost prohibitive. The cramping and diarrhea have improved.  However, she feels like she still has ongoing gas. It's not as bad as it was, but, she remains bothered by it.  Symptoms triggered by dairy, gluten, sugar.  There is no constipation.  She continues on pantoprazole for her diagnosis on gastritis.    Labs 07/17/21 showed TTGA <1.0, IgA 71  At the time of office visit 09/12/2020 she had not yet completed the SIBO breath test as she thought that was the celiac testing and she elected to try to avoid gluten in her diet, instead. Her symptoms are unchanged. She remains bothered by the gas. She is trying to identify foods beyond lactose that trigger her symptoms.   Lactulose SIBO breath test positive.  Increase in hydrogen 18 ppm, increase in methane 5 ppm for an increase in combined hydrogen and methane of 23 ppm. Given these results I recommend 2 weeks of doxycycline 100 mg twice daily because her insurance would not cover Xifaxan.   She returns today in follow-up. Two days into treatment she was feeling better but symptoms returned before she completed the antibiotics. While on treatment the gas was much better. She is now following an anti-inflammatory diet instead of lowFODMAP.   There is a family history of colon polyps. Mother with precancerous polyps and ulcerative colitis. No known family history of colon cancer or polyps.     Her husband died of colon cancer 17 years ago.   TTGA <1.0, IgA 71  Prior endoscopic history:  Colonoscopy 2000 -  hyperplastic polyp in the ascending colon Colonoscopy 2006 - normal Colonoscopy 2011 - normal  Colonoscopy with Dr. Allyn Kenner 09/18/14 - normal colonoscopy. Repeat in 5 years due to family history EGD 06/10/2021:  Distal esophageal congestion, patchy gastritis, small hiatal hernia    - Path: Focal erosion and reactive changes, normal esophageal biopsies Colonoscopy 06/10/2021: Thick rubbery perianal abnormality, sigmoid diverticulosis, a small ascending colon polyp.    - Path: Tubular adenoma, normal colonic mucosa Past Medical History:  Diagnosis Date   Bladder problem    Hyperlipidemia    Insomnia    Migraines    Osteoporosis    Vitamin D deficiency     Past Surgical History:  Procedure Laterality Date   ADENOIDECTOMY     BUNIONECTOMY Left 07/01/2021   TUBAL LIGATION       Current Outpatient Medications  Medication Sig Dispense Refill   acyclovir (ZOVIRAX) 200 MG capsule Take 200 mg by mouth as needed.      Cholecalciferol (VITAMIN D3) 2000 UNITS TABS Take 1 tablet by mouth daily.     escitalopram (LEXAPRO) 20 MG tablet Take 20 mg by mouth daily.     fluticasone (FLONASE) 50 MCG/ACT nasal spray as needed.   5   pregabalin (LYRICA) 150 MG capsule Take 1 capsule by mouth daily at 12 noon.     simvastatin (ZOCOR) 20 MG tablet Take 1 tablet (20 mg total) by mouth at bedtime. Pt needs to schedule f/u appt for further refills. 30 tablet 1   TURMERIC PO Take by mouth daily.  zinc gluconate 50 MG tablet Take by mouth daily. 2 tabs qd     zonisamide (ZONEGRAN) 50 MG capsule Take 150 mg by mouth daily.     No current facility-administered medications for this visit.    Allergies as of 10/27/2021 - Review Complete 10/27/2021  Allergen Reaction Noted   Aspirin  05/20/2021   Lactose  07/17/2021   Codeine Nausea Only     Physical Exam: Gen: Awake, alert, and oriented, and well communicative. HEENT: EOMI, non-icteric sclera, NCAT, MMM  Neck: Normal movement of head and neck  Pulm: No labored breathing, speaking in full sentences without conversational dyspnea  Abd: Soft, non-tender, non-distended, no rebound or guarding Derm: No apparent lesions or bruising in visible field  MS: Moves  all visible extremities without noticeable abnormality  Psych: Pleasant, cooperative, normal speech, thought processing seemingly intact      Elpidio Thielen L. Tarri Glenn, MD, MPH 10/27/2021, 11:21 AM

## 2021-10-27 NOTE — Telephone Encounter (Signed)
Corrected script sent to patient's pharmacy. ?

## 2021-10-27 NOTE — Patient Instructions (Addendum)
It was my pleasure to provide care to you today. Based on our discussion, I am providing you with my recommendations below: ? ?RECOMMENDATION(S):  ? ?PRESCRIPTION MEDICATION(S):  ? ?We have sent the following medication(s) to your pharmacy: ? ?Doxycycline 100 mg twice daily for 14 days. ?Dicyclomine 10 mg four times daily 20-30 minutes before meals and at bedtime.  ? ?BMI: ? ?If you are age 76 or older, your body mass index should be between 23-30. Your Body mass index is 27.4 kg/m?Marland Kitchen If this is out of the aforementioned range listed, please consider follow up with your Primary Care Provider. ?  ? ?MY CHART: ? ?The [facility] providers would like to encourage you to use [facility] to communicate with providers for non-urgent requests or questions.  Due to long hold times on the telephone, sending your provider a message by [facility] may be a faster and more efficient way to get a response.  Please allow 48 business hours for a response.  Please remember that this is for non-urgent requests.  ? ?Thank you for trusting me with your gastrointestinal care!   ? ?Thornton Park, MD, MPH ? ?

## 2021-10-27 NOTE — Telephone Encounter (Signed)
We received a call from patient requesting DOXYCYCLINE medication be sent to pharmacy in tablet form not capsule form. Per patient, capsule makes her very sick. Please advise. ?

## 2021-10-30 ENCOUNTER — Other Ambulatory Visit: Payer: Self-pay | Admitting: Endocrinology

## 2021-10-30 DIAGNOSIS — M5416 Radiculopathy, lumbar region: Secondary | ICD-10-CM | POA: Diagnosis not present

## 2021-10-30 DIAGNOSIS — Z1231 Encounter for screening mammogram for malignant neoplasm of breast: Secondary | ICD-10-CM

## 2021-11-03 ENCOUNTER — Ambulatory Visit: Payer: Medicare HMO | Admitting: Gastroenterology

## 2021-11-05 DIAGNOSIS — M546 Pain in thoracic spine: Secondary | ICD-10-CM | POA: Diagnosis not present

## 2021-11-05 DIAGNOSIS — M810 Age-related osteoporosis without current pathological fracture: Secondary | ICD-10-CM | POA: Diagnosis not present

## 2021-11-05 DIAGNOSIS — E559 Vitamin D deficiency, unspecified: Secondary | ICD-10-CM | POA: Diagnosis not present

## 2021-11-05 DIAGNOSIS — I6523 Occlusion and stenosis of bilateral carotid arteries: Secondary | ICD-10-CM | POA: Diagnosis not present

## 2021-11-05 DIAGNOSIS — E785 Hyperlipidemia, unspecified: Secondary | ICD-10-CM | POA: Diagnosis not present

## 2021-11-05 DIAGNOSIS — R69 Illness, unspecified: Secondary | ICD-10-CM | POA: Diagnosis not present

## 2021-11-05 DIAGNOSIS — K635 Polyp of colon: Secondary | ICD-10-CM | POA: Diagnosis not present

## 2021-11-05 DIAGNOSIS — K219 Gastro-esophageal reflux disease without esophagitis: Secondary | ICD-10-CM | POA: Diagnosis not present

## 2021-11-05 DIAGNOSIS — R1084 Generalized abdominal pain: Secondary | ICD-10-CM | POA: Diagnosis not present

## 2021-11-05 DIAGNOSIS — M5481 Occipital neuralgia: Secondary | ICD-10-CM | POA: Diagnosis not present

## 2021-11-05 DIAGNOSIS — G43909 Migraine, unspecified, not intractable, without status migrainosus: Secondary | ICD-10-CM | POA: Diagnosis not present

## 2021-11-07 ENCOUNTER — Ambulatory Visit: Payer: Medicare HMO

## 2021-11-11 ENCOUNTER — Other Ambulatory Visit: Payer: Self-pay | Admitting: Gastroenterology

## 2021-11-11 ENCOUNTER — Ambulatory Visit
Admission: RE | Admit: 2021-11-11 | Discharge: 2021-11-11 | Disposition: A | Discharge status: Home / Self Care | Payer: Medicare HMO | Source: Ambulatory Visit | Attending: Endocrinology | Admitting: Endocrinology

## 2021-11-11 DIAGNOSIS — Z1231 Encounter for screening mammogram for malignant neoplasm of breast: Secondary | ICD-10-CM | POA: Diagnosis not present

## 2021-11-11 DIAGNOSIS — Z1283 Encounter for screening for malignant neoplasm of skin: Secondary | ICD-10-CM | POA: Diagnosis not present

## 2021-11-11 DIAGNOSIS — L821 Other seborrheic keratosis: Secondary | ICD-10-CM | POA: Diagnosis not present

## 2021-11-12 DIAGNOSIS — M791 Myalgia, unspecified site: Secondary | ICD-10-CM | POA: Diagnosis not present

## 2021-11-12 DIAGNOSIS — G43719 Chronic migraine without aura, intractable, without status migrainosus: Secondary | ICD-10-CM | POA: Diagnosis not present

## 2021-11-12 DIAGNOSIS — G518 Other disorders of facial nerve: Secondary | ICD-10-CM | POA: Diagnosis not present

## 2021-11-12 DIAGNOSIS — M542 Cervicalgia: Secondary | ICD-10-CM | POA: Diagnosis not present

## 2021-11-14 DIAGNOSIS — M79672 Pain in left foot: Secondary | ICD-10-CM | POA: Diagnosis not present

## 2021-11-14 DIAGNOSIS — M2042 Other hammer toe(s) (acquired), left foot: Secondary | ICD-10-CM | POA: Diagnosis not present

## 2021-12-02 ENCOUNTER — Encounter: Payer: Self-pay | Admitting: Obstetrics & Gynecology

## 2021-12-22 DIAGNOSIS — J3489 Other specified disorders of nose and nasal sinuses: Secondary | ICD-10-CM | POA: Diagnosis not present

## 2021-12-22 DIAGNOSIS — J302 Other seasonal allergic rhinitis: Secondary | ICD-10-CM | POA: Diagnosis not present

## 2022-01-08 ENCOUNTER — Encounter: Payer: Self-pay | Admitting: Cardiology

## 2022-01-08 ENCOUNTER — Ambulatory Visit: Payer: Medicare HMO | Admitting: Cardiology

## 2022-01-08 VITALS — BP 133/73 | HR 85 | Temp 97.7°F | Resp 16 | Ht 61.0 in | Wt 150.0 lb

## 2022-01-08 DIAGNOSIS — E7800 Pure hypercholesterolemia, unspecified: Secondary | ICD-10-CM | POA: Diagnosis not present

## 2022-01-08 DIAGNOSIS — R5381 Other malaise: Secondary | ICD-10-CM | POA: Diagnosis not present

## 2022-01-08 DIAGNOSIS — G4709 Other insomnia: Secondary | ICD-10-CM

## 2022-01-08 DIAGNOSIS — R5383 Other fatigue: Secondary | ICD-10-CM | POA: Diagnosis not present

## 2022-01-08 DIAGNOSIS — R0789 Other chest pain: Secondary | ICD-10-CM | POA: Diagnosis not present

## 2022-01-08 NOTE — Progress Notes (Signed)
Primary Physician/Referring:  Reynold Bowen, MD  Patient ID: Kylie Wheeler, female    DOB: Oct 19, 1945, 76 y.o.   MRN: [id number]  Chief Complaint  Patient presents with   Chest Pain   Follow-up   HPI:    Kylie Wheeler  is a 76 y.o.  Caucasian female has hyperlipidemia, I have seen her about 4 years ago, presents to be evaluated for chest pain, described as discomfort and mild in the left upper part of the chest.  Most of the episodes occurring at rest and after eating but not with exertion activity.  No shortness of breath, no palpitations, no dizziness or syncope.  He is 76 years of age as of today, happy birthday.  Past Medical History:  Diagnosis Date   Bladder problem    Hyperlipidemia    Insomnia    Migraines    Osteoporosis    Vitamin D deficiency    Past Surgical History:  Procedure Laterality Date   ADENOIDECTOMY     BUNIONECTOMY Left 07/01/2021   FOOT SURGERY Left    TUBAL LIGATION     Social History   Socioeconomic History   Marital status: Widowed    Spouse name: Not on file   Number of children: 3   Years of education: Not on file   Highest education level: Not on file  Occupational History   Occupation: retired-- [employer]  Tobacco Use   Smoking status: Never   Smokeless tobacco: Never  Vaping Use   Vaping Use: Never used  Substance and Sexual Activity   Alcohol use: No   Drug use: No   Sexual activity: Not Currently    Partners: Male    Comment: pt actually said whether she is sexually active or not is none of the govt business  Other Topics Concern   Not on file  Social History Narrative   Not on file   Social Determinants of Health   Financial Resource Strain: Not on file  Food Insecurity: Not on file  Transportation Needs: Not on file  Physical Activity: Not on file  Stress: Not on file  Social Connections: Not on file  Intimate Partner Violence: Not on file   ROS  Review of Systems  Cardiovascular:  Positive for chest  pain. Negative for dyspnea on exertion and leg swelling.   Objective      01/08/2022    3:00 PM 10/27/2021   11:17 AM 09/12/2021    1:34 PM  Vitals with BMI  Height 5' 1" 5' 1" 5' 1"  Weight 150 lbs 145 lbs   BMI 59.93 57.01   Systolic 779 390 300  Diastolic 73 80 74  Pulse 85 56 70    Blood pressure 133/73, pulse 85, temperature 97.7 F (36.5 C), resp. rate 16, height 5' 1" (1.549 m), weight 150 lb (68 kg), SpO2 95 %. Body mass index is 28.34 kg/m.   Physical Exam Neck:     Vascular: No JVD.  Cardiovascular:     Rate and Rhythm: Normal rate and regular rhythm.     Pulses: Intact distal pulses.     Heart sounds: Normal heart sounds. No murmur heard.    No gallop.  Pulmonary:     Effort: Pulmonary effort is normal.     Breath sounds: Normal breath sounds.  Abdominal:     General: Bowel sounds are normal.     Palpations: Abdomen is soft.  Musculoskeletal:     Right lower leg: No  edema.     Left lower leg: No edema.    Radiology: No results found.  Laboratory examination:  External labs:  Labs 04/08/2021:  Serum glucose 85 mg, BUN 11, creatinine 0.8, EGFR 69 mL.  Potassium 4.3.  LFTs normal.  Vitamin D40.1.  External labs  Hb 13.1/HCT 39.1, platelets 211, normal indicis.  Total cholesterol 181, triglycerides 72, HDL 64, LDL 103.  Non-HDL cholesterol 117.  Medications   Allergies  Allergen Reactions   Aspirin     Other reaction(s): Abdominal Pain   Lactose    Codeine Nausea Only    REACTION: sick on stomach    Current Outpatient Medications:    acyclovir (ZOVIRAX) 200 MG capsule, Take 200 mg by mouth as needed. , Disp: , Rfl:    Cholecalciferol (VITAMIN D3) 2000 UNITS TABS, Take 1 tablet by mouth daily., Disp: , Rfl:    dicyclomine (BENTYL) 10 MG capsule, Take 1 capsule (10 mg total) by mouth 4 (four) times daily -  before meals and at bedtime., Disp: 120 capsule, Rfl: 1   escitalopram (LEXAPRO) 20 MG tablet, Take 20 mg by mouth daily., Disp: , Rfl:     fluticasone (FLONASE) 50 MCG/ACT nasal spray, as needed. , Disp: , Rfl: 5   pregabalin (LYRICA) 150 MG capsule, Take 1 capsule by mouth daily at 12 noon., Disp: , Rfl:    simvastatin (ZOCOR) 20 MG tablet, Take 1 tablet (20 mg total) by mouth at bedtime. Pt needs to schedule f/u appt for further refills., Disp: 30 tablet, Rfl: 1   TURMERIC PO, Take by mouth daily., Disp: , Rfl:    zinc gluconate 50 MG tablet, Take by mouth daily. 2 tabs qd, Disp: , Rfl:    zonisamide (ZONEGRAN) 50 MG capsule, Take 150 mg by mouth daily., Disp: , Rfl:    Cardiac Studies:   Echo- 01/12/13 1.Left ventricular cavity is normal in size.   Normal global wall motion.   Normal systolic global function.   Calculated EF 57%. 2.Mitral valve structurally normal.   Trace mitral regurgitation.    3.Tricuspid valve structurally normal.   Trace tricuspid regurgitation.  Treadmill exercise stress test 10/28/2015: Indications: Chest pain The resting electrocardiogram demonstrated normal sinus rhythm, normal resting conduction, no resting arrhythmias and normal rest repolarization.  The stress electrocardiogram was normal.  There were no significant arrhythmias. Patient exercised on Bruce protocol for  5:25 minutes and achieved  101% of Max Predicted HR (Target HR was >85% MPHR) and  7.05 METS. Stress symptoms included dyspnea. Normal BP response.  Exercise capacity was low. Impression: Normal stress EKG.  Carotid duplex 06/29/2012: No evidence of hemodynamically significant stenosis in the bilateral carotid bifurcation vessels.  Cardiopulmonary stress test performed on 11/14/2012: Functional status normal, peak VO2 94% predicted. Ischemic myocardial dysfunction with decreased stroke volume noted at 4 minutes of exercise. LV dysfunction start set heart rate of 93 bpm. Patient had resting bradycardia. Pulmonary status was normal. Impression: Risk assessment for surgery: High risk, aerobic threshold less than 11 mL per kilogram per  minute. Pulmonary status not impaired. Stress EKG was nondiagnostic due to motion artifact. No chest pain was described. Predicted anaerobic threshold was greater than 9.8, patient achieved 9.8. Stress test was terminated due to fatigue.  EKG:  01/08/2022: Normal sinus rhythm at rate of 75 bpm.  Normal EKG.  Assessment     ICD-10-CM   1. Other chest pain  R07.89 EKG 12-Lead    2. Hypercholesteremia  E78.00  3. Malaise and fatigue  R53.81    R53.83     4. Other insomnia  G47.09       Recommendations:   Patient presenting with chest pain for evaluation, risk factor include hyperlipidemia.  Chest pain symptoms are clearly musculoskeletal.  Do not think she needs any further cardiac testing for this as chest pain symptoms are very focal and limited to the left upper chest.  Risk factors are hyperlipidemia and age.  Reviewed her external labs, LDL is fairly close to goal <100, presently 103.  We discussed regarding making dietary changes.  With regard to malaise and fatigue, insomnia, on further questioning, the symptoms are clearly related to her poor sleeping habits.  She takes naps during the day occasionally but mostly in the evening she keeps herself awake very late in the night watching shows on the TV or on the electronics.  I discussed with her that making lifestyle changes with regard to every day exercise, good sleeping habits and sleeping early, using OTC ZzzQuil for sleep aid would be initial steps.  Trazodone could also be tried.  I simply reassured her.  Reviewed her labs.  Do not think she needs cardiac testing at this point.  I will see her back on a as needed basis.   Adrian Prows, MD, [facility] 01/08/2022, 3:53 PM Office: [phone] Fax: [phone] Pager: [phone]

## 2022-02-05 DIAGNOSIS — Z719 Counseling, unspecified: Secondary | ICD-10-CM

## 2022-03-02 DIAGNOSIS — J302 Other seasonal allergic rhinitis: Secondary | ICD-10-CM | POA: Diagnosis not present

## 2022-03-02 DIAGNOSIS — K219 Gastro-esophageal reflux disease without esophagitis: Secondary | ICD-10-CM | POA: Diagnosis not present

## 2022-03-17 ENCOUNTER — Other Ambulatory Visit (HOSPITAL_COMMUNITY): Payer: Self-pay | Admitting: *Deleted

## 2022-03-18 ENCOUNTER — Ambulatory Visit (HOSPITAL_COMMUNITY)
Admission: RE | Admit: 2022-03-18 | Discharge: 2022-03-18 | Disposition: A | Discharge status: Home / Self Care | Payer: Medicare HMO | Source: Ambulatory Visit | Attending: Endocrinology | Admitting: Endocrinology

## 2022-03-18 DIAGNOSIS — M810 Age-related osteoporosis without current pathological fracture: Secondary | ICD-10-CM | POA: Insufficient documentation

## 2022-03-18 MED ORDER — DENOSUMAB 60 MG/ML ~~LOC~~ SOSY
60.0000 mg | PREFILLED_SYRINGE | Freq: Once | SUBCUTANEOUS | Status: AC
  Administered 2022-03-18: 60 mg via SUBCUTANEOUS

## 2022-03-18 MED ORDER — DENOSUMAB 60 MG/ML ~~LOC~~ SOSY
PREFILLED_SYRINGE | SUBCUTANEOUS | Status: AC
  Filled 2022-03-18: qty 1

## 2022-03-24 ENCOUNTER — Ambulatory Visit (HOSPITAL_BASED_OUTPATIENT_CLINIC_OR_DEPARTMENT_OTHER): Payer: Medicare HMO | Admitting: Obstetrics & Gynecology

## 2022-04-03 DIAGNOSIS — H2513 Age-related nuclear cataract, bilateral: Secondary | ICD-10-CM | POA: Diagnosis not present

## 2022-04-03 DIAGNOSIS — H5203 Hypermetropia, bilateral: Secondary | ICD-10-CM | POA: Diagnosis not present

## 2022-04-06 ENCOUNTER — Other Ambulatory Visit (HOSPITAL_COMMUNITY)
Admission: RE | Admit: 2022-04-06 | Discharge: 2022-04-06 | Disposition: A | Discharge status: Home / Self Care | Payer: Medicare HMO | Source: Ambulatory Visit | Attending: Obstetrics & Gynecology | Admitting: Obstetrics & Gynecology

## 2022-04-06 ENCOUNTER — Ambulatory Visit (HOSPITAL_BASED_OUTPATIENT_CLINIC_OR_DEPARTMENT_OTHER): Payer: Medicare HMO | Admitting: Obstetrics & Gynecology

## 2022-04-06 VITALS — BP 130/78 | HR 72 | Resp 14

## 2022-04-06 DIAGNOSIS — M810 Age-related osteoporosis without current pathological fracture: Secondary | ICD-10-CM

## 2022-04-06 DIAGNOSIS — F419 Anxiety disorder, unspecified: Secondary | ICD-10-CM | POA: Diagnosis not present

## 2022-04-06 DIAGNOSIS — Z124 Encounter for screening for malignant neoplasm of cervix: Secondary | ICD-10-CM

## 2022-04-06 DIAGNOSIS — R140 Abdominal distension (gaseous): Secondary | ICD-10-CM | POA: Diagnosis not present

## 2022-04-06 DIAGNOSIS — R69 Illness, unspecified: Secondary | ICD-10-CM | POA: Diagnosis not present

## 2022-04-06 NOTE — Progress Notes (Unsigned)
GYNECOLOGY  VISIT  CC:   new patient appointment  HPI: 76 y.o. Widowed White or Caucasian female here for new patient/gyn exam.  Recently went to a [facility] [employer] and has concerns about ovarian cancer as has bloating and lower abdominal symptoms.  This isn't new but she knows this is a concerning symptom.  Has seen Dr. Tarri Glenn.  Had colonoscopy 05/2021 with small polyp and perianal lesion noted.  Was referred to Dr. Dema Severin.    Denies vaginal bleeding.  Hasn't had a recent pap smear.  Guidelines reviewed.  She desires to have one today.   Past Medical History:  Diagnosis Date   Bladder problem    Hyperlipidemia    Insomnia    Migraines    Osteoporosis    Vitamin D deficiency     MEDS:   Current Outpatient Medications on File Prior to Visit  Medication Sig Dispense Refill   acyclovir (ZOVIRAX) 200 MG capsule Take 200 mg by mouth as needed.      Cholecalciferol (VITAMIN D3) 2000 UNITS TABS Take 1 tablet by mouth daily.     escitalopram (LEXAPRO) 20 MG tablet Take 20 mg by mouth daily.     fluticasone (FLONASE) 50 MCG/ACT nasal spray as needed.   5   pregabalin (LYRICA) 150 MG capsule Take 1 capsule by mouth daily at 12 noon.     simvastatin (ZOCOR) 20 MG tablet Take 1 tablet (20 mg total) by mouth at bedtime. Pt needs to schedule f/u appt for further refills. 30 tablet 1   TURMERIC PO Take by mouth daily.     zonisamide (ZONEGRAN) 50 MG capsule Take 150 mg by mouth daily.     No current facility-administered medications on file prior to visit.    ALLERGIES: Aspirin, Lactose, and Codeine  SH:  widowed, non smoker  Review of Systems  Constitutional: Negative.   Genitourinary: Negative.     PHYSICAL EXAMINATION:    BP 130/78   Pulse 72   Resp 14     General appearance: alert, cooperative and appears stated age Breasts: normal appearance, no masses or tenderness Abdomen: soft, non-tender; bowel sounds normal; no masses,  no organomegaly Lymph:  no inguinal LAD  noted  Pelvic: External genitalia:  no lesions              Urethra:  normal appearing urethra with no masses, tenderness or lesions              Bartholins and Skenes: normal                 Vagina: normal appearing vagina with normal color and discharge, no lesions              Cervix: no lesions              Bimanual Exam:  Uterus:  normal size, contour, position, consistency, mobility, non-tender              Adnexa: no mass, fullness, tenderness  Chaperone, Tonya Tecumseh, CMA, was present for exam.  Assessment/Plan: 1. Abdominal bloating - will have pt proceed with pelvic u/s for more thorough evaluation of the ovaries - US PELVIC COMPLETE WITH TRANSVAGINAL; Future  2. Cervical cancer screening - Cytology - PAP( [place])  3. Age-related osteoporosis without current pathological fracture - does BMD with Dr. Forde Dandy  4. Anxiety

## 2022-04-07 LAB — CYTOLOGY - PAP: Diagnosis: NEGATIVE

## 2022-04-09 ENCOUNTER — Ambulatory Visit (HOSPITAL_BASED_OUTPATIENT_CLINIC_OR_DEPARTMENT_OTHER)
Admission: RE | Admit: 2022-04-09 | Discharge: 2022-04-09 | Disposition: A | Discharge status: Home / Self Care | Payer: Medicare HMO | Source: Ambulatory Visit | Attending: Obstetrics & Gynecology | Admitting: Obstetrics & Gynecology

## 2022-04-09 ENCOUNTER — Encounter (HOSPITAL_BASED_OUTPATIENT_CLINIC_OR_DEPARTMENT_OTHER): Payer: Self-pay | Admitting: Obstetrics & Gynecology

## 2022-04-09 DIAGNOSIS — Z9851 Tubal ligation status: Secondary | ICD-10-CM | POA: Diagnosis not present

## 2022-04-09 DIAGNOSIS — Z780 Asymptomatic menopausal state: Secondary | ICD-10-CM | POA: Insufficient documentation

## 2022-04-09 DIAGNOSIS — N950 Postmenopausal bleeding: Secondary | ICD-10-CM | POA: Diagnosis not present

## 2022-04-09 DIAGNOSIS — R140 Abdominal distension (gaseous): Secondary | ICD-10-CM | POA: Insufficient documentation

## 2022-04-09 DIAGNOSIS — R9389 Abnormal findings on diagnostic imaging of other specified body structures: Secondary | ICD-10-CM | POA: Diagnosis not present

## 2022-04-10 ENCOUNTER — Ambulatory Visit (INDEPENDENT_AMBULATORY_CARE_PROVIDER_SITE_OTHER): Payer: Medicare HMO | Admitting: Obstetrics & Gynecology

## 2022-04-10 ENCOUNTER — Other Ambulatory Visit (HOSPITAL_COMMUNITY)
Admission: RE | Admit: 2022-04-10 | Discharge: 2022-04-10 | Disposition: A | Discharge status: Home / Self Care | Payer: Medicare HMO | Source: Ambulatory Visit | Attending: Obstetrics & Gynecology | Admitting: Obstetrics & Gynecology

## 2022-04-10 ENCOUNTER — Encounter (HOSPITAL_BASED_OUTPATIENT_CLINIC_OR_DEPARTMENT_OTHER): Payer: Self-pay | Admitting: Obstetrics & Gynecology

## 2022-04-10 VITALS — BP 139/74 | HR 62 | Ht 61.0 in | Wt 156.6 lb

## 2022-04-10 DIAGNOSIS — R935 Abnormal findings on diagnostic imaging of other abdominal regions, including retroperitoneum: Secondary | ICD-10-CM | POA: Diagnosis not present

## 2022-04-10 DIAGNOSIS — K629 Disease of anus and rectum, unspecified: Secondary | ICD-10-CM

## 2022-04-10 DIAGNOSIS — N858 Other specified noninflammatory disorders of uterus: Secondary | ICD-10-CM | POA: Diagnosis not present

## 2022-04-10 NOTE — Progress Notes (Signed)
GYNECOLOGY  VISIT  CC:   endoemtrial biopsy  HPI: 76 y.o.  Widowed White or Caucasian female here for endometrial biopsy after ultrasound done earlier this week showed endometrium of 40m.  Pt is not having any bleeding and pap smear was normal.  Possible causes of this discussed.  Feel this is most likely and endometrial polyp so treatment with hysteroscopic resection also discussed.  In reviewing records, colonoscopy done last year with Dr. BTarri Glennshowed a "thick rubbery perianal lesion".  Pt was referred to Dr. WDema Severinbut never went for appt.  Colonoscopy was 05/2021.  Discussed with pt.  Feel she should proceed with additional evaluation.  Will refer back to general surgery to Dr. TMarcello Moores  Pt more comparable seeing a female provider.   Past Medical History:  Diagnosis Date   Bladder problem    Hyperlipidemia    Insomnia    Migraines    Osteoporosis    Vitamin D deficiency     MEDS:   Current Outpatient Medications on File Prior to Visit  Medication Sig Dispense Refill   acyclovir (ZOVIRAX) 200 MG capsule Take 200 mg by mouth as needed.      Cholecalciferol (VITAMIN D3) 2000 UNITS TABS Take 1 tablet by mouth daily.     escitalopram (LEXAPRO) 20 MG tablet Take 20 mg by mouth daily.     fluticasone (FLONASE) 50 MCG/ACT nasal spray as needed.   5   pregabalin (LYRICA) 150 MG capsule Take 1 capsule by mouth daily at 12 noon.     simvastatin (ZOCOR) 20 MG tablet Take 1 tablet (20 mg total) by mouth at bedtime. Pt needs to schedule f/u appt for further refills. 30 tablet 1   TURMERIC PO Take by mouth daily.     zonisamide (ZONEGRAN) 50 MG capsule Take 150 mg by mouth daily.     No current facility-administered medications on file prior to visit.    ALLERGIES: Aspirin, Lactose, and Codeine  SH:  widowed, non smoker  Review of Systems  Constitutional: Negative.   Genitourinary: Negative.     PHYSICAL EXAMINATION:    BP 139/74 (BP Location: Right Arm, Patient Position:  Sitting, Cuff Size: Large)   Pulse 62   Ht '5\' 1"'$  (1.549 m) Comment: Reported  Wt 156 lb 9.6 oz (71 kg)   BMI 29.59 kg/m     General appearance: alert, cooperative and appears stated age  Pelvic: External genitalia:  no lesions              Urethra:  normal appearing urethra with no masses, tenderness or lesions              Bartholins and Skenes: normal                 Vagina: normal appearing vagina with normal color and discharge, no lesions             Endometrial biopsy recommended.  Discussed with patient.  Verbal and written consent obtained.   Procedure:  Speculum placed.  Cervix visualized and cleansed with betadine prep.  A single toothed tenaculum was applied to the anterior lip of the cervix.  Endometrial pipelle was advanced through the cervix into the endometrial cavity without difficulty.  Pipelle passed to 6cm.  Suction applied and pipelle removed with scant tissue sample obtained.  Second pass performed.  Tenculum removed.  No bleeding noted.  Patient tolerated procedure well.  Chaperone, TOctaviano Batty CMA, was present for exam.  Assessment/Plan: 1. Anal  lesion - Ambulatory referral to General Surgery  2. Abnormal ultrasound of endometrium - Surgical pathology( [place])

## 2022-04-13 LAB — SURGICAL PATHOLOGY

## 2022-04-15 DIAGNOSIS — M5416 Radiculopathy, lumbar region: Secondary | ICD-10-CM | POA: Diagnosis not present

## 2022-04-15 DIAGNOSIS — M4156 Other secondary scoliosis, lumbar region: Secondary | ICD-10-CM | POA: Diagnosis not present

## 2022-04-15 DIAGNOSIS — M545 Low back pain, unspecified: Secondary | ICD-10-CM | POA: Diagnosis not present

## 2022-04-22 ENCOUNTER — Encounter (HOSPITAL_BASED_OUTPATIENT_CLINIC_OR_DEPARTMENT_OTHER): Payer: Self-pay | Admitting: *Deleted

## 2022-04-27 ENCOUNTER — Telehealth: Payer: Self-pay

## 2022-04-27 NOTE — Telephone Encounter (Signed)
Called patient to discuss potential surgery dates, no answer, will try again later

## 2022-05-04 ENCOUNTER — Encounter (HOSPITAL_BASED_OUTPATIENT_CLINIC_OR_DEPARTMENT_OTHER): Payer: Self-pay

## 2022-05-05 DIAGNOSIS — R69 Illness, unspecified: Secondary | ICD-10-CM | POA: Diagnosis not present

## 2022-05-05 DIAGNOSIS — E785 Hyperlipidemia, unspecified: Secondary | ICD-10-CM | POA: Diagnosis not present

## 2022-05-05 DIAGNOSIS — E559 Vitamin D deficiency, unspecified: Secondary | ICD-10-CM | POA: Diagnosis not present

## 2022-05-15 DIAGNOSIS — R0789 Other chest pain: Secondary | ICD-10-CM | POA: Diagnosis not present

## 2022-05-15 DIAGNOSIS — K629 Disease of anus and rectum, unspecified: Secondary | ICD-10-CM | POA: Diagnosis not present

## 2022-05-15 DIAGNOSIS — R69 Illness, unspecified: Secondary | ICD-10-CM | POA: Diagnosis not present

## 2022-05-15 DIAGNOSIS — M5481 Occipital neuralgia: Secondary | ICD-10-CM | POA: Diagnosis not present

## 2022-05-15 DIAGNOSIS — F5104 Psychophysiologic insomnia: Secondary | ICD-10-CM | POA: Diagnosis not present

## 2022-05-15 DIAGNOSIS — F419 Anxiety disorder, unspecified: Secondary | ICD-10-CM | POA: Diagnosis not present

## 2022-05-15 DIAGNOSIS — K219 Gastro-esophageal reflux disease without esophagitis: Secondary | ICD-10-CM | POA: Diagnosis not present

## 2022-05-15 DIAGNOSIS — M810 Age-related osteoporosis without current pathological fracture: Secondary | ICD-10-CM | POA: Diagnosis not present

## 2022-05-15 DIAGNOSIS — Z0000 Encounter for general adult medical examination without abnormal findings: Secondary | ICD-10-CM | POA: Diagnosis not present

## 2022-05-15 DIAGNOSIS — I6523 Occlusion and stenosis of bilateral carotid arteries: Secondary | ICD-10-CM | POA: Diagnosis not present

## 2022-05-15 DIAGNOSIS — E785 Hyperlipidemia, unspecified: Secondary | ICD-10-CM | POA: Diagnosis not present

## 2022-05-15 DIAGNOSIS — Z1331 Encounter for screening for depression: Secondary | ICD-10-CM | POA: Diagnosis not present

## 2022-05-15 DIAGNOSIS — R82998 Other abnormal findings in urine: Secondary | ICD-10-CM | POA: Diagnosis not present

## 2022-05-15 DIAGNOSIS — E559 Vitamin D deficiency, unspecified: Secondary | ICD-10-CM | POA: Diagnosis not present

## 2022-05-20 DIAGNOSIS — M5416 Radiculopathy, lumbar region: Secondary | ICD-10-CM | POA: Diagnosis not present

## 2022-05-22 ENCOUNTER — Other Ambulatory Visit (HOSPITAL_BASED_OUTPATIENT_CLINIC_OR_DEPARTMENT_OTHER): Payer: Self-pay | Admitting: Obstetrics & Gynecology

## 2022-05-22 DIAGNOSIS — Z01818 Encounter for other preprocedural examination: Secondary | ICD-10-CM

## 2022-05-22 DIAGNOSIS — R935 Abnormal findings on diagnostic imaging of other abdominal regions, including retroperitoneum: Secondary | ICD-10-CM

## 2022-06-01 DIAGNOSIS — K644 Residual hemorrhoidal skin tags: Secondary | ICD-10-CM | POA: Diagnosis not present

## 2022-06-17 DIAGNOSIS — D485 Neoplasm of uncertain behavior of skin: Secondary | ICD-10-CM | POA: Diagnosis not present

## 2022-06-17 DIAGNOSIS — L821 Other seborrheic keratosis: Secondary | ICD-10-CM | POA: Diagnosis not present

## 2022-06-17 DIAGNOSIS — Z1283 Encounter for screening for malignant neoplasm of skin: Secondary | ICD-10-CM | POA: Diagnosis not present

## 2022-06-17 DIAGNOSIS — D225 Melanocytic nevi of trunk: Secondary | ICD-10-CM | POA: Diagnosis not present

## 2022-06-22 DIAGNOSIS — G43719 Chronic migraine without aura, intractable, without status migrainosus: Secondary | ICD-10-CM | POA: Diagnosis not present

## 2022-06-26 ENCOUNTER — Encounter (HOSPITAL_BASED_OUTPATIENT_CLINIC_OR_DEPARTMENT_OTHER): Payer: Self-pay | Admitting: Obstetrics & Gynecology

## 2022-06-26 NOTE — Progress Notes (Addendum)
Spoke w/ via phone for pre-op interview--- pt Lab needs dos----  cbc             Lab results------ no COVID test -----patient states asymptomatic no test needed Arrive at ------- 1000 on 06-30-2022 NPO after MN NO Solid Food.  Clear liquids from MN until--- 0900 Med rec completed Medications to take morning of surgery -----  pepcid Diabetic medication ----- n/a Patient instructed no nail polish to be worn day of surgery Patient instructed to bring photo id and insurance card day of surgery Patient aware to have Driver (ride ) / caregiver    for 24 hours after surgery -- friend, lynn Patient Special Instructions ----- n/a Pre-Op special Istructions ----- n/a Patient verbalized understanding of instructions that were given at this phone interview. Patient denies shortness of breath, chest pain, fever, cough at this phone interview.

## 2022-06-30 ENCOUNTER — Encounter (HOSPITAL_BASED_OUTPATIENT_CLINIC_OR_DEPARTMENT_OTHER): Payer: Self-pay | Admitting: Obstetrics & Gynecology

## 2022-06-30 ENCOUNTER — Other Ambulatory Visit: Payer: Self-pay

## 2022-06-30 ENCOUNTER — Encounter (HOSPITAL_BASED_OUTPATIENT_CLINIC_OR_DEPARTMENT_OTHER)
Admission: RE | Disposition: A | Discharge status: Home / Self Care | Payer: Self-pay | Source: Home / Self Care | Attending: Obstetrics & Gynecology

## 2022-06-30 ENCOUNTER — Ambulatory Visit (HOSPITAL_BASED_OUTPATIENT_CLINIC_OR_DEPARTMENT_OTHER)
Admission: RE | Admit: 2022-06-30 | Discharge: 2022-06-30 | Disposition: A | Discharge status: Home / Self Care | Payer: Medicare HMO | Attending: Obstetrics & Gynecology | Admitting: Obstetrics & Gynecology

## 2022-06-30 ENCOUNTER — Ambulatory Visit (HOSPITAL_BASED_OUTPATIENT_CLINIC_OR_DEPARTMENT_OTHER): Payer: Medicare HMO | Admitting: Certified Registered"

## 2022-06-30 DIAGNOSIS — K219 Gastro-esophageal reflux disease without esophagitis: Secondary | ICD-10-CM | POA: Diagnosis not present

## 2022-06-30 DIAGNOSIS — N840 Polyp of corpus uteri: Secondary | ICD-10-CM

## 2022-06-30 DIAGNOSIS — R935 Abnormal findings on diagnostic imaging of other abdominal regions, including retroperitoneum: Secondary | ICD-10-CM | POA: Diagnosis not present

## 2022-06-30 DIAGNOSIS — R9389 Abnormal findings on diagnostic imaging of other specified body structures: Secondary | ICD-10-CM | POA: Diagnosis not present

## 2022-06-30 DIAGNOSIS — Z01818 Encounter for other preprocedural examination: Secondary | ICD-10-CM

## 2022-06-30 HISTORY — DX: Cervicalgia: M54.2

## 2022-06-30 HISTORY — DX: Abnormal findings on diagnostic imaging of other abdominal regions, including retroperitoneum: R93.5

## 2022-06-30 HISTORY — DX: Other cervical disc degeneration, unspecified cervical region: M50.30

## 2022-06-30 HISTORY — DX: Binge eating disorder, unspecified: F50.819

## 2022-06-30 HISTORY — DX: Occipital neuralgia: M54.81

## 2022-06-30 HISTORY — PX: HYSTEROSCOPY WITH D & C: SHX1775

## 2022-06-30 HISTORY — DX: Spondylosis without myelopathy or radiculopathy, cervical region: M47.812

## 2022-06-30 HISTORY — DX: Binge eating disorder: F50.81

## 2022-06-30 HISTORY — DX: Gastro-esophageal reflux disease without esophagitis: K21.9

## 2022-06-30 LAB — CBC
HCT: 36.5 % (ref 36.0–46.0)
Hemoglobin: 11.7 g/dL — ABNORMAL LOW (ref 12.0–15.0)
MCH: 28.7 pg (ref 26.0–34.0)
MCHC: 32.1 g/dL (ref 30.0–36.0)
MCV: 89.5 fL (ref 80.0–100.0)
Platelets: 238 10*3/uL (ref 150–400)
RBC: 4.08 MIL/uL (ref 3.87–5.11)
RDW: 12.8 % (ref 11.5–15.5)
WBC: 4.1 10*3/uL (ref 4.0–10.5)
nRBC: 0 % (ref 0.0–0.2)

## 2022-06-30 SURGERY — DILATATION AND CURETTAGE /HYSTEROSCOPY
Anesthesia: General | Site: Vagina

## 2022-06-30 MED ORDER — SODIUM CHLORIDE 0.9 % IR SOLN
Status: DC | PRN
  Administered 2022-06-30: 3000 mL

## 2022-06-30 MED ORDER — ONDANSETRON HCL 4 MG/2ML IJ SOLN
INTRAMUSCULAR | Status: AC
  Filled 2022-06-30: qty 2

## 2022-06-30 MED ORDER — PROPOFOL 10 MG/ML IV BOLUS
INTRAVENOUS | Status: AC
  Filled 2022-06-30: qty 20

## 2022-06-30 MED ORDER — LACTATED RINGERS IV SOLN
INTRAVENOUS | Status: DC
  Administered 2022-06-30: 1000 mL via INTRAVENOUS

## 2022-06-30 MED ORDER — PROPOFOL 10 MG/ML IV BOLUS
INTRAVENOUS | Status: DC | PRN
  Administered 2022-06-30: 150 mg via INTRAVENOUS

## 2022-06-30 MED ORDER — ONDANSETRON HCL 4 MG/2ML IJ SOLN: 4.0000 mg | Freq: Once | INTRAMUSCULAR | Status: DC | PRN

## 2022-06-30 MED ORDER — OXYCODONE HCL 5 MG/5ML PO SOLN: 5.0000 mg | Freq: Once | ORAL | Status: AC | PRN

## 2022-06-30 MED ORDER — KETOROLAC TROMETHAMINE 30 MG/ML IJ SOLN
INTRAMUSCULAR | Status: AC
  Filled 2022-06-30: qty 1

## 2022-06-30 MED ORDER — HYDROCODONE-ACETAMINOPHEN 5-325 MG PO TABS: 1.0000 | ORAL_TABLET | Freq: Four times a day (QID) | ORAL | 0 refills | Status: DC | PRN

## 2022-06-30 MED ORDER — KETOROLAC TROMETHAMINE 15 MG/ML IJ SOLN
INTRAMUSCULAR | Status: DC | PRN
  Administered 2022-06-30: 15 mg via INTRAVENOUS

## 2022-06-30 MED ORDER — FENTANYL CITRATE (PF) 100 MCG/2ML IJ SOLN
INTRAMUSCULAR | Status: AC
  Filled 2022-06-30: qty 2

## 2022-06-30 MED ORDER — FENTANYL CITRATE (PF) 100 MCG/2ML IJ SOLN
INTRAMUSCULAR | Status: DC | PRN
  Administered 2022-06-30: 25 ug via INTRAVENOUS
  Administered 2022-06-30: 75 ug via INTRAVENOUS

## 2022-06-30 MED ORDER — DEXAMETHASONE SODIUM PHOSPHATE 10 MG/ML IJ SOLN
INTRAMUSCULAR | Status: AC
  Filled 2022-06-30: qty 1

## 2022-06-30 MED ORDER — ACETAMINOPHEN 10 MG/ML IV SOLN: 1000.0000 mg | Freq: Once | INTRAVENOUS | Status: DC | PRN

## 2022-06-30 MED ORDER — OXYCODONE HCL 5 MG PO TABS
ORAL_TABLET | ORAL | Status: AC
  Filled 2022-06-30: qty 1

## 2022-06-30 MED ORDER — FENTANYL CITRATE (PF) 100 MCG/2ML IJ SOLN
25.0000 ug | INTRAMUSCULAR | Status: DC | PRN
  Administered 2022-06-30: 50 ug via INTRAVENOUS

## 2022-06-30 MED ORDER — DEXAMETHASONE SODIUM PHOSPHATE 10 MG/ML IJ SOLN
INTRAMUSCULAR | Status: DC | PRN
  Administered 2022-06-30: 5 mg via INTRAVENOUS

## 2022-06-30 MED ORDER — LIDOCAINE 2% (20 MG/ML) 5 ML SYRINGE
INTRAMUSCULAR | Status: DC | PRN
  Administered 2022-06-30: 60 mg via INTRAVENOUS

## 2022-06-30 MED ORDER — LIDOCAINE-EPINEPHRINE 1 %-1:100000 IJ SOLN
INTRAMUSCULAR | Status: DC | PRN
  Administered 2022-06-30: 10 mL

## 2022-06-30 MED ORDER — POVIDONE-IODINE 10 % EX SWAB: 2.0000 | Freq: Once | CUTANEOUS | Status: DC

## 2022-06-30 MED ORDER — OXYCODONE HCL 5 MG PO TABS
5.0000 mg | ORAL_TABLET | Freq: Once | ORAL | Status: AC | PRN
  Administered 2022-06-30: 5 mg via ORAL

## 2022-06-30 MED ORDER — ONDANSETRON HCL 4 MG/2ML IJ SOLN
INTRAMUSCULAR | Status: DC | PRN
  Administered 2022-06-30: 4 mg via INTRAVENOUS

## 2022-06-30 MED ORDER — EPHEDRINE 5 MG/ML INJ
INTRAVENOUS | Status: AC
  Filled 2022-06-30: qty 5

## 2022-06-30 SURGICAL SUPPLY — 12 items
CATH ROBINSON RED A/P 16FR (CATHETERS) ×2 IMPLANT
DEVICE MYOSURE LITE (MISCELLANEOUS) IMPLANT
GLOVE BIOGEL PI IND STRL 7.0 (GLOVE) ×2 IMPLANT
GLOVE ECLIPSE 6.5 STRL STRAW (GLOVE) ×4 IMPLANT
GOWN STRL REUS W/TWL LRG LVL3 (GOWN DISPOSABLE) ×4 IMPLANT
IV NS IRRIG 3000ML ARTHROMATIC (IV SOLUTION) ×2 IMPLANT
KIT PROCEDURE FLUENT (KITS) ×2 IMPLANT
KIT TURNOVER CYSTO (KITS) ×2 IMPLANT
PACK VAGINAL MINOR WOMEN LF (CUSTOM PROCEDURE TRAY) ×2 IMPLANT
PAD OB MATERNITY 4.3X12.25 (PERSONAL CARE ITEMS) ×2 IMPLANT
SEAL ROD LENS SCOPE MYOSURE (ABLATOR) ×2 IMPLANT
TOWEL OR 17X26 10 PK STRL BLUE (TOWEL DISPOSABLE) ×2 IMPLANT

## 2022-06-30 NOTE — Transfer of Care (Signed)
Immediate Anesthesia Transfer of Care Note  Patient: Kylie Wheeler  Procedure(s) Performed: Procedure(s) (LRB): DILATATION AND CURETTAGE /HYSTEROSCOPY with POLYP RESECTION (N/A)  Patient Location: PACU  Anesthesia Type: General  Level of Consciousness: awake, oriented, sedated and patient cooperative  Airway & Oxygen Therapy: Patient Spontanous Breathing and Patient connected to face mask oxygen  Post-op Assessment: Report given to PACU RN and Post -op Vital signs reviewed and stable  Post vital signs: Reviewed and stable  Complications: No apparent anesthesia complications Vitals Value Taken Time  BP 124/68 06/30/22 1208  Temp 36.6 C 06/30/22 1208  Pulse 82 06/30/22 1210  Resp 15 06/30/22 1210  SpO2 99 % 06/30/22 1210  Vitals shown include unvalidated device data.  Last Pain:  Vitals:   06/30/22 1038  TempSrc:   PainSc: 0-No pain      Patients Stated Pain Goal: 6 (72/15/87 2761)  Complications: No notable events documented.

## 2022-06-30 NOTE — H&P (Signed)
Kylie Wheeler is an 77 y.o. female G3 P3 here for additional evaluation of thickened endometrium (77m) noted on ultrasound performed in October due to bloating.  Pt has not had any abnormal bleeding.  Endometrial biopsy was negative 04/10/2022.  Inactive endometrium was noted.  Pt and I have discussed with the thickened endometrium, hysteroscopy with resection of any endometrial lesion  Pertinent Gynecological History: Menses: post-menopausal Bleeding: none Contraception: post menopausal status DES exposure: denies Blood transfusions: none Sexually transmitted diseases: no past history Previous GYN Procedures:  none   Last mammogram: normal Date: 11/12/2021 Last pap: normal Date: 04/06/2022 OB History: G3, P3   Menstrual History: No LMP recorded. Patient is postmenopausal.    Past Medical History:  Diagnosis Date   Abnormal endometrial ultrasound    Bilateral occipital neuralgia    Binge eating disorder    Cervical spondylosis    Cervicalgia    DDD (degenerative disc disease), cervical    GERD (gastroesophageal reflux disease)    Hyperlipidemia    Insomnia    Migraines    followed by [facility] , dr fDomingo Cocking  Osteoporosis    Vitamin D deficiency     Past Surgical History:  Procedure Laterality Date   ADENOIDECTOMY     child   ANTERIOR CERVICAL DECOMP/DISCECTOMY FUSION  03/2017   C5-6   BUNIONECTOMY Left 07/01/2021   '@[facility]'$    CERVICAL SPINE SURGERY  2020   @ [facility];   RADIOFREQUENCY MEDICAL CERVICAL BILATERAL C2, C3, C4 ,  left side 04-21-2019 and right side 05-29-2019   TUBAL LIGATION Bilateral    yrs ago    Family History  Problem Relation Age of Onset   Stroke Mother    Atrial fibrillation Mother    Heart failure Mother    Stroke Father    Heart failure Father    Hyperlipidemia Sister    Hyperlipidemia Brother    Hypertension Brother    Hyperlipidemia Brother    Hypertension Brother    Heart disease Maternal Grandfather    Heart disease Paternal  Grandmother    Heart disease Paternal Grandfather     Social History:  reports that she has never smoked. She has never used smokeless tobacco. She reports that she does not drink alcohol and does not use drugs.  Allergies:  Allergies  Allergen Reactions   Aspirin     GI upset   Lactose    Codeine Nausea Only    GI upset    No medications prior to admission.    Review of Systems  Constitutional: Negative.   Genitourinary:  Positive for vaginal bleeding.    Height '5\' 1"'$  (1.549 m), weight 71.7 kg. Physical Exam Constitutional:      Appearance: Normal appearance.  Cardiovascular:     Rate and Rhythm: Normal rate and regular rhythm.  Pulmonary:     Effort: Pulmonary effort is normal.     Breath sounds: Normal breath sounds.  Neurological:     General: No focal deficit present.     Mental Status: She is alert.  Psychiatric:        Mood and Affect: Mood normal.        Behavior: Behavior normal.     No results found for this or any previous visit (from the past 24 hour(s)).  No results found.  Assessment/Plan: 77yo G3 P3 here for hysteroscopy with possible polyp resection, dilation and curettage for additional evaluation of thickened endometrium noted on ultrasound.  Procedure reviewed, questions answered.  Pt here and ready to proceed.  Megan Salon 06/30/2022, 8:32 AM

## 2022-06-30 NOTE — Anesthesia Preprocedure Evaluation (Signed)
Anesthesia Evaluation  Patient identified by MRN, date of birth, ID band Patient awake    Reviewed: Allergy & Precautions, H&P , NPO status , Patient's Chart, lab work & pertinent test results  Airway Mallampati: II  TM Distance: >3 FB Neck ROM: Full    Dental no notable dental hx.    Pulmonary neg pulmonary ROS   Pulmonary exam normal breath sounds clear to auscultation       Cardiovascular negative cardio ROS Normal cardiovascular exam Rhythm:Regular Rate:Normal     Neuro/Psych negative neurological ROS  negative psych ROS   GI/Hepatic Neg liver ROS,GERD  Medicated,,  Endo/Other  negative endocrine ROS    Renal/GU negative Renal ROS  negative genitourinary   Musculoskeletal negative musculoskeletal ROS (+)    Abdominal   Peds negative pediatric ROS (+)  Hematology negative hematology ROS (+)   Anesthesia Other Findings   Reproductive/Obstetrics negative OB ROS                             Anesthesia Physical Anesthesia Plan  ASA: 2  Anesthesia Plan: General   Post-op Pain Management: Minimal or no pain anticipated   Induction: Intravenous  PONV Risk Score and Plan: 3 and Ondansetron, Dexamethasone and Treatment may vary due to age or medical condition  Airway Management Planned: LMA  Additional Equipment:   Intra-op Plan:   Post-operative Plan: Extubation in OR  Informed Consent: I have reviewed the patients History and Physical, chart, labs and discussed the procedure including the risks, benefits and alternatives for the proposed anesthesia with the patient or authorized representative who has indicated his/her understanding and acceptance.     Dental advisory given  Plan Discussed with: CRNA and Surgeon  Anesthesia Plan Comments:        Anesthesia Quick Evaluation

## 2022-06-30 NOTE — Anesthesia Procedure Notes (Signed)
Procedure Name: LMA Insertion Date/Time: 06/30/2022 11:33 AM  Performed by: Suan Halter, CRNAPre-anesthesia Checklist: Patient identified, Emergency Drugs available, Suction available and Patient being monitored Patient Re-evaluated:Patient Re-evaluated prior to induction Oxygen Delivery Method: Circle system utilized Preoxygenation: Pre-oxygenation with 100% oxygen Induction Type: IV induction Ventilation: Mask ventilation without difficulty LMA: LMA inserted LMA Size: 4.0 Number of attempts: 1 Airway Equipment and Method: Bite block Placement Confirmation: positive ETCO2 Tube secured with: Tape Dental Injury: Teeth and Oropharynx as per pre-operative assessment

## 2022-06-30 NOTE — Discharge Instructions (Addendum)
Post-surgical Instructions, Outpatient Surgery  You may expect to feel dizzy, weak, and drowsy for as long as 24 hours after receiving the medicine that made you sleep (anesthetic). For the first 24 hours after your surgery:   Do not drive a car, ride a bicycle, participate in physical activities, or take public transportation until you are done taking narcotic pain medicines or as directed by Dr. Sabra Heck.  Do not drink alcohol or take tranquilizers.  Do not take medicine that has not been prescribed by your physicians.  Do not sign important papers or make important decisions while on narcotic pain medicines.  Have a responsible person with you.   PAIN MANAGEMENT Over the counter motrin (if you can take this).  You can take 3 over the counter motrin every 6 hours as needed ('600mg'$ ) or two extra strength tylenol every 6 hours as needed for pain/cramping.     Vicodin 5/'325mg'$ .  For more severe pain, take one or two tablets every four to six hours as needed for pain control.  If you take this, don't take the over the counter Tylenol and this has some tylenol in it as well.  (Remember that narcotic pain medications increase your risk of constipation.  If this becomes a problem, you may take an over the counter stool softener like Colace '100mg'$  up to four times a day.)  DO'S AND DON'T'S Do not take a tub bath for one week.  You may shower on the first day after your surgery Do not do any heavy lifting for one to two weeks.  This increases the chance of bleeding. Do move around as you feel able.  Stairs are fine.  You may begin to exercise again as you feel able.  Do not lift any weights for two weeks. Do not put anything in the vagina for two weeks--no tampons, intercourse, or douching.    REGULAR MEDIATIONS/VITAMINS: You may restart all of your regular medications as prescribed. You may restart all of your vitamins as you normally take them.    PLEASE CALL OR SEEK MEDICAL CARE IF: You have  persistent nausea and vomiting.  You have trouble eating or drinking.  You have an oral temperature above 100.5.  You have constipation that is not helped by adjusting diet or increasing fluid intake. Pain medicines are a common cause of constipation.  You have heavy vaginal bleeding   Post Anesthesia Home Care Instructions  Activity: Get plenty of rest for the remainder of the day. A responsible individual must stay with you for 24 hours following the procedure.  For the next 24 hours, DO NOT: -Drive a car -Paediatric nurse -Drink alcoholic beverages -Take any medication unless instructed by your physician -Make any legal decisions or sign important papers.  Meals: Start with liquid foods such as gelatin or soup. Progress to regular foods as tolerated. Avoid greasy, spicy, heavy foods. If nausea and/or vomiting occur, drink only clear liquids until the nausea and/or vomiting subsides. Call your physician if vomiting continues.  Special Instructions/Symptoms: Your throat may feel dry or sore from the anesthesia or the breathing tube placed in your throat during surgery. If this causes discomfort, gargle with warm salt water. The discomfort should disappear within 24 hours.  May have Ibuprofen, Advil, Motrin, or Aleve beginning at 8 PM as needed for cramping/soreness.

## 2022-06-30 NOTE — Anesthesia Postprocedure Evaluation (Signed)
Anesthesia Post Note  Patient: Kylie Wheeler  Procedure(s) Performed: DILATATION AND CURETTAGE /HYSTEROSCOPY with POLYP RESECTION (Vagina )     Patient location during evaluation: PACU Anesthesia Type: General Level of consciousness: awake and alert Pain management: pain level controlled Vital Signs Assessment: post-procedure vital signs reviewed and stable Respiratory status: spontaneous breathing, nonlabored ventilation, respiratory function stable and patient connected to nasal cannula oxygen Cardiovascular status: blood pressure returned to baseline and stable Postop Assessment: no apparent nausea or vomiting Anesthetic complications: no  No notable events documented.  Last Vitals:  Vitals:   06/30/22 1215 06/30/22 1230  BP: 122/62 122/61  Pulse: 82 81  Resp: 15 18  Temp:    SpO2: 100% 98%    Last Pain:  Vitals:   06/30/22 1230  TempSrc:   PainSc: 3                  Lance Galas S

## 2022-06-30 NOTE — Op Note (Signed)
06/30/2022  12:04 PM  PATIENT:  Kylie Wheeler  77 y.o. female  PRE-OPERATIVE DIAGNOSIS:  Thickened Endometrium Polyp  POST-OPERATIVE DIAGNOSIS:  Thickened EndometriumPolyp  PROCEDURE:  Procedure(s): DILATATION AND CURETTAGE /HYSTEROSCOPY with POLYP RESECTION  SURGEON:  Megan Salon  ASSISTANTS: OR staff.    ANESTHESIA:   general  ESTIMATED BLOOD LOSS: 5 mL  BLOOD ADMINISTERED:none   FLUIDS: 400cc LR  UOP: 50  SPECIMEN:  endometrial polyp and endometrial curettings  DISPOSITION OF SPECIMEN:  PATHOLOGY  FINDINGS: thin endometrium, endometrial polyp that filled the endometrial cavity  DESCRIPTION OF OPERATION: Patient was taken to the operating room.  She is placed in the supine position. SCDs were on her lower extremities and functioning properly. General anesthesia with an LMA was administered without difficulty. Dr. Kalman Shan, anesthesia, oversaw case.  Legs were then placed in the [place] in the low lithotomy position. The legs were lifted to the high lithotomy position and the Betadine prep was used on the inner thighs perineum and vagina x3. Patient was draped in a normal standard fashion. An in and out catheterization with a red rubber Foley catheter was performed. Approximately 50 cc of clear urine was noted. A bivalve speculum was placed the vagina. The anterior lip of the cervix was grasped with single-tooth tenaculum.  A paracervical block of 1% lidocaine mixed one-to-one with epinephrine (1:100,000 units).  10 cc was used total. The cervix is dilated up to #21 [facility] dilators. The endometrial cavity sounded to 7 cm.   A 5.5 Myosure hysteroscope was obtained. Normal saline was used as a hysteroscopic fluid. The hysteroscope was advanced through the endocervical canal into the endometrial cavity. The tubal ostia were noted bilaterally. Additional findings included endometrial polyp that filled the endometrial cavity.  Using the [facility] lite device, the polyp was  resected until flush with the endometrial cavity.  The polyp was fully removed.  The hysteroscope was removed. A #1 toothed curette was used to curette the cavity until rough gritty texture was noted in all quadrants.  The fluid deficit was 90 cc. The tenaculum was removed from the anterior lip of the cervix. The speculum was removed from the vagina. The prep was cleansed of the patient's skin. The legs are positioned back in the supine position. Sponge, lap, needle, instrument counts were correct x2. Patient was taken to recovery in stable condition.  COUNTS:  YES  PLAN OF CARE: Transfer to PACU

## 2022-07-01 ENCOUNTER — Encounter (HOSPITAL_BASED_OUTPATIENT_CLINIC_OR_DEPARTMENT_OTHER): Payer: Self-pay | Admitting: Obstetrics & Gynecology

## 2022-07-01 LAB — SURGICAL PATHOLOGY

## 2022-07-30 ENCOUNTER — Encounter (HOSPITAL_BASED_OUTPATIENT_CLINIC_OR_DEPARTMENT_OTHER): Payer: Medicare HMO | Admitting: Obstetrics & Gynecology

## 2022-08-03 ENCOUNTER — Encounter: Payer: Self-pay | Admitting: *Deleted

## 2022-08-05 DIAGNOSIS — G43719 Chronic migraine without aura, intractable, without status migrainosus: Secondary | ICD-10-CM | POA: Diagnosis not present

## 2022-08-05 DIAGNOSIS — M791 Myalgia, unspecified site: Secondary | ICD-10-CM | POA: Diagnosis not present

## 2022-08-05 DIAGNOSIS — M542 Cervicalgia: Secondary | ICD-10-CM | POA: Diagnosis not present

## 2022-08-05 DIAGNOSIS — G518 Other disorders of facial nerve: Secondary | ICD-10-CM | POA: Diagnosis not present

## 2022-08-13 DIAGNOSIS — J029 Acute pharyngitis, unspecified: Secondary | ICD-10-CM | POA: Diagnosis not present

## 2022-08-13 DIAGNOSIS — Z1152 Encounter for screening for COVID-19: Secondary | ICD-10-CM | POA: Diagnosis not present

## 2022-08-13 DIAGNOSIS — B001 Herpesviral vesicular dermatitis: Secondary | ICD-10-CM | POA: Diagnosis not present

## 2022-08-13 DIAGNOSIS — G43909 Migraine, unspecified, not intractable, without status migrainosus: Secondary | ICD-10-CM | POA: Diagnosis not present

## 2022-08-13 DIAGNOSIS — R5383 Other fatigue: Secondary | ICD-10-CM | POA: Diagnosis not present

## 2022-08-13 DIAGNOSIS — R059 Cough, unspecified: Secondary | ICD-10-CM | POA: Diagnosis not present

## 2022-08-13 DIAGNOSIS — R0981 Nasal congestion: Secondary | ICD-10-CM | POA: Diagnosis not present

## 2022-08-13 DIAGNOSIS — J069 Acute upper respiratory infection, unspecified: Secondary | ICD-10-CM | POA: Diagnosis not present

## 2022-09-07 DIAGNOSIS — R319 Hematuria, unspecified: Secondary | ICD-10-CM | POA: Diagnosis not present

## 2022-09-08 ENCOUNTER — Telehealth (HOSPITAL_BASED_OUTPATIENT_CLINIC_OR_DEPARTMENT_OTHER): Payer: Self-pay | Admitting: *Deleted

## 2022-09-08 NOTE — Telephone Encounter (Signed)
Pt called stated she has been having vaginal spotting. Upon review of chart, pt had polyp removal in the winter and had no post op.  I left message for pt to call back. KW CMA

## 2022-09-15 ENCOUNTER — Encounter: Payer: Self-pay | Admitting: Gastroenterology

## 2022-10-05 ENCOUNTER — Ambulatory Visit: Payer: Medicare HMO | Admitting: Cardiology

## 2022-10-05 ENCOUNTER — Encounter: Payer: Self-pay | Admitting: Cardiology

## 2022-10-05 VITALS — BP 128/70 | HR 56 | Resp 16 | Ht 61.0 in | Wt 165.8 lb

## 2022-10-05 DIAGNOSIS — R0609 Other forms of dyspnea: Secondary | ICD-10-CM | POA: Diagnosis not present

## 2022-10-05 NOTE — Progress Notes (Unsigned)
Primary Physician/Referring:  Adrian Prince, MD  Patient ID: Kylie Wheeler, female    DOB: 1946/03/25, 77 y.o.   MRN: [id number]  Chief Complaint  Patient presents with   Other chest pain   Follow-up   HPI:    Kylie Wheeler  is a 77 y.o.  Caucasian female has hyperlipidemia, who had seen about a year ago for musculoskeletal chest pain with appointment to see me for dyspnea on exertion.  Dyspnea has been ongoing for the last 6 months treatments, does admit to having gained about 15 to 20 pounds in weight over the past year due to lack of physical activity.  No PND or orthopnea.  States that she had left foot surgery and since that she had reduced her physical activity.  No leg edema, no painful swelling of lower extremity.  No hemoptysis. Past Medical History:  Diagnosis Date   Abnormal endometrial ultrasound    Bilateral occipital neuralgia    Binge eating disorder    Cervical spondylosis    Cervicalgia    DDD (degenerative disc disease), cervical    GERD (gastroesophageal reflux disease)    Hyperlipidemia    Insomnia    Migraines    followed by headache clinic , dr Neale Burly   Osteoporosis    Vitamin D deficiency    Past Surgical History:  Procedure Laterality Date   ADENOIDECTOMY     child   ANTERIOR CERVICAL DECOMP/DISCECTOMY FUSION  03/2017   C5-6   BUNIONECTOMY Left 07/01/2021      CERVICAL SPINE SURGERY  2020   @ [facility];   RADIOFREQUENCY MEDICAL CERVICAL BILATERAL C2, C3, C4 ,  left side 04-21-2019 and right side 05-29-2019   HYSTEROSCOPY WITH D & C N/A 06/30/2022   Procedure: DILATATION AND CURETTAGE /HYSTEROSCOPY with POLYP RESECTION;  Surgeon: Jerene Bears, MD;  Location: [facility] [place];  Service: Gynecology;  Laterality: N/A;   TUBAL LIGATION Bilateral    yrs ago   Social History   Socioeconomic History   Marital status: Widowed    Spouse name: Not on file   Number of children: 3   Years of education: Not on file   Highest  education level: Not on file  Occupational History   Occupation: retired-- [employer]  Tobacco Use   Smoking status: Never   Smokeless tobacco: Never  Vaping Use   Vaping Use: Never used  Substance and Sexual Activity   Alcohol use: No   Drug use: Never   Sexual activity: Not Currently    Partners: Male    Birth control/protection: Post-menopausal    Comment: pt actually said whether she is sexually active or not is none of the [employer] business  Other Topics Concern   Not on file  Social History Narrative   Not on file   Social Determinants of Health   Financial Resource Strain: Not on file  Food Insecurity: Not on file  Transportation Needs: Not on file  Physical Activity: Not on file  Stress: Not on file  Social Connections: Not on file  Intimate Partner Violence: Not on file   ROS  Review of Systems  Cardiovascular:  Positive for dyspnea on exertion. Negative for chest pain and leg swelling.   Objective      10/05/2022    4:32 PM 10/05/2022    4:26 PM 06/30/2022    1:00 PM  Vitals with BMI  Height     Weight  165 lbs 13 oz  BMI  31.34   Systolic 128 165 782  Diastolic 70 82 62  Pulse 56 58 74    Blood pressure 128/70, pulse (!) 56, resp. rate 16, height 5\' 1"  (1.549 m), weight 165 lb 12.8 oz (75.2 kg), SpO2 97 %. Body mass index is 31.33 kg/m.   Physical Exam Neck:     Vascular: No JVD.  Cardiovascular:     Rate and Rhythm: Normal rate and regular rhythm.     Pulses: Intact distal pulses.     Heart sounds: Normal heart sounds. No murmur heard.    No gallop.  Pulmonary:     Effort: Pulmonary effort is normal.     Breath sounds: Normal breath sounds.  Abdominal:     General: Bowel sounds are normal.     Palpations: Abdomen is soft.  Musculoskeletal:     Right lower leg: No edema.     Left lower leg: No edema.    Radiology: No results found.  Laboratory examination:  External labs:  Labs 04/08/2021:  Serum glucose 85 mg, BUN 11,  creatinine 0.8, EGFR 69 mL.  Potassium 4.3.  LFTs normal.  Vitamin D40.1.  External labs  Hb 13.1/HCT 39.1, platelets 211, normal indicis.  Total cholesterol 181, triglycerides 72, HDL 64, LDL 103.  Non-HDL cholesterol 117.  Medications   Allergies  Allergen Reactions   Aspirin     GI upset   Lactose    Codeine Nausea Only    GI upset    Current Outpatient Medications:    Cholecalciferol (VITAMIN D3) 2000 UNITS TABS, Take 1 tablet by mouth daily., Disp: , Rfl:    denosumab (PROLIA) 60 MG/ML SOSY injection, Inject 60 mg into the skin every 6 (six) months., Disp: , Rfl:    escitalopram (LEXAPRO) 20 MG tablet, Take 20 mg by mouth at bedtime., Disp: , Rfl:    Omega-3 Fatty Acids (FISH OIL) 1000 MG CPDR, Take by mouth 4 (four) times a week., Disp: , Rfl:    omeprazole (PRILOSEC OTC) 20 MG tablet, Take 20 mg by mouth as needed., Disp: , Rfl:    pregabalin (LYRICA) 150 MG capsule, Take 1 capsule by mouth at bedtime., Disp: , Rfl:    Probiotic Product (ALIGN) 4 MG CAPS, Take 1 capsule by mouth daily., Disp: , Rfl:    simvastatin (ZOCOR) 20 MG tablet, Take 1 tablet (20 mg total) by mouth at bedtime. Pt needs to schedule f/u appt for further refills. (Patient taking differently: Take 20 mg by mouth daily. Pt needs to schedule f/u appt for further refills.), Disp: 30 tablet, Rfl: 1   TURMERIC PO, Take 1 capsule by mouth daily., Disp: , Rfl:    zonisamide (ZONEGRAN) 50 MG capsule, Take 150 mg by mouth at bedtime., Disp: , Rfl:    acyclovir (ZOVIRAX) 200 MG capsule, Take 200 mg by mouth as needed (cold sores). (Patient not taking: Reported on 10/05/2022), Disp: , Rfl:    fluticasone (FLONASE) 50 MCG/ACT nasal spray, Place 1 spray into both nostrils as needed for rhinitis or allergies., Disp: , Rfl: 5   Cardiac Studies:   Echocardiogram 03/14/2019: Left ventricle cavity is normal in size. Normal left ventricular wall thickness. Normal LV systolic function with EF 64%. Normal global wall motion.  Normal diastolic filling pattern.  Trileaflet aortic valve with no regurgitation noted. Mild (Grade I) mitral regurgitation. Mild tricuspid regurgitation. No evidence of pulmonary hypertension.  Exercise treadmill stress test performed using Bruce protocol 03/22/2019:   Patient reached  8.6 METS, and 112% of age predicted maximum heart rate.  Exercise capacity was low normal.  No chest pain reported.  Normal heart rate and hemodynamic response. Stress EKG revealed no ischemic changes. Normal stress test.  EKG:  EKG 10/05/2022: Normal sinus rhythm with rate of 58 bpm.  Normal EKG. no significant change from 01/08/2022.   Assessment     ICD-10-CM   1. Dyspnea on exertion  R06.09 EKG 12-Lead      Recommendations:   Kylie Wheeler  is a 77 y.o.  Caucasian female has hyperlipidemia, who had seen about a year ago for musculoskeletal chest pain with appointment to see me for dyspnea on exertion.  Patient symptoms of dyspnea clearly related to recent lack of physical activity.  In spite of 77 years of age, physical examination and EKG are completely normal.  There is no PND orthopnea, no suggestion to suggest pulmonary embolism or any significant pulmonary abnormality or etiology.  I discussed with her regarding water aerobics and also walking on a daily basis, if her symptoms still persist then we could consider further cardiac evaluation.  Otherwise stable from cardiac standpoint, I reassured her.  I will see her back on a as needed basis.      Yates Decamp, MD, [facility] 10/05/2022, 4:52 PM Office: [phone] Fax: [phone] Pager: [phone]

## 2022-10-19 NOTE — Telephone Encounter (Signed)
Pt never called back. KW CMA 

## 2022-11-24 DIAGNOSIS — J069 Acute upper respiratory infection, unspecified: Secondary | ICD-10-CM | POA: Diagnosis not present

## 2022-11-24 DIAGNOSIS — J209 Acute bronchitis, unspecified: Secondary | ICD-10-CM | POA: Diagnosis not present

## 2022-12-03 DIAGNOSIS — J069 Acute upper respiratory infection, unspecified: Secondary | ICD-10-CM | POA: Diagnosis not present

## 2022-12-03 DIAGNOSIS — J209 Acute bronchitis, unspecified: Secondary | ICD-10-CM | POA: Diagnosis not present

## 2022-12-03 DIAGNOSIS — G43909 Migraine, unspecified, not intractable, without status migrainosus: Secondary | ICD-10-CM | POA: Diagnosis not present

## 2022-12-03 DIAGNOSIS — F988 Other specified behavioral and emotional disorders with onset usually occurring in childhood and adolescence: Secondary | ICD-10-CM | POA: Diagnosis not present

## 2023-01-14 ENCOUNTER — Other Ambulatory Visit: Payer: Self-pay | Admitting: Endocrinology

## 2023-01-14 DIAGNOSIS — Z1231 Encounter for screening mammogram for malignant neoplasm of breast: Secondary | ICD-10-CM

## 2023-01-28 ENCOUNTER — Ambulatory Visit: Payer: Medicare HMO

## 2023-02-02 DIAGNOSIS — G43719 Chronic migraine without aura, intractable, without status migrainosus: Secondary | ICD-10-CM | POA: Diagnosis not present

## 2023-02-17 ENCOUNTER — Ambulatory Visit
Admission: RE | Admit: 2023-02-17 | Discharge: 2023-02-17 | Disposition: A | Discharge status: Home / Self Care | Payer: Medicare HMO | Source: Ambulatory Visit | Attending: Endocrinology | Admitting: Endocrinology

## 2023-02-17 DIAGNOSIS — Z1231 Encounter for screening mammogram for malignant neoplasm of breast: Secondary | ICD-10-CM

## 2023-04-08 DIAGNOSIS — H5203 Hypermetropia, bilateral: Secondary | ICD-10-CM | POA: Diagnosis not present

## 2023-04-08 DIAGNOSIS — H2513 Age-related nuclear cataract, bilateral: Secondary | ICD-10-CM | POA: Diagnosis not present

## 2023-04-15 DIAGNOSIS — M25562 Pain in left knee: Secondary | ICD-10-CM | POA: Diagnosis not present

## 2023-05-18 DIAGNOSIS — B009 Herpesviral infection, unspecified: Secondary | ICD-10-CM | POA: Diagnosis not present

## 2023-05-18 DIAGNOSIS — J0100 Acute maxillary sinusitis, unspecified: Secondary | ICD-10-CM | POA: Diagnosis not present

## 2023-06-28 DIAGNOSIS — M25562 Pain in left knee: Secondary | ICD-10-CM | POA: Diagnosis not present

## 2023-06-28 DIAGNOSIS — M810 Age-related osteoporosis without current pathological fracture: Secondary | ICD-10-CM | POA: Diagnosis not present

## 2023-06-28 DIAGNOSIS — E785 Hyperlipidemia, unspecified: Secondary | ICD-10-CM | POA: Diagnosis not present

## 2023-07-02 DIAGNOSIS — Z0000 Encounter for general adult medical examination without abnormal findings: Secondary | ICD-10-CM | POA: Diagnosis not present

## 2023-07-02 DIAGNOSIS — N1831 Chronic kidney disease, stage 3a: Secondary | ICD-10-CM | POA: Diagnosis not present

## 2023-07-02 DIAGNOSIS — Z1331 Encounter for screening for depression: Secondary | ICD-10-CM | POA: Diagnosis not present

## 2023-07-02 DIAGNOSIS — Z1339 Encounter for screening examination for other mental health and behavioral disorders: Secondary | ICD-10-CM | POA: Diagnosis not present

## 2023-07-06 ENCOUNTER — Other Ambulatory Visit: Payer: Self-pay | Admitting: Endocrinology

## 2023-07-06 DIAGNOSIS — N1831 Chronic kidney disease, stage 3a: Secondary | ICD-10-CM

## 2023-07-07 ENCOUNTER — Ambulatory Visit
Admission: RE | Admit: 2023-07-07 | Discharge: 2023-07-07 | Disposition: A | Discharge status: Home / Self Care | Payer: Medicare Other | Source: Ambulatory Visit | Attending: Endocrinology | Admitting: Endocrinology

## 2023-07-07 DIAGNOSIS — N189 Chronic kidney disease, unspecified: Secondary | ICD-10-CM | POA: Diagnosis not present

## 2023-07-07 DIAGNOSIS — N1831 Chronic kidney disease, stage 3a: Secondary | ICD-10-CM

## 2023-07-08 DIAGNOSIS — K080 Exfoliation of teeth due to systemic causes: Secondary | ICD-10-CM | POA: Diagnosis not present

## 2023-07-21 DIAGNOSIS — G43719 Chronic migraine without aura, intractable, without status migrainosus: Secondary | ICD-10-CM | POA: Diagnosis not present

## 2023-09-21 DIAGNOSIS — M5416 Radiculopathy, lumbar region: Secondary | ICD-10-CM | POA: Diagnosis not present

## 2023-09-22 ENCOUNTER — Other Ambulatory Visit: Payer: Self-pay

## 2023-09-22 DIAGNOSIS — M4726 Other spondylosis with radiculopathy, lumbar region: Secondary | ICD-10-CM

## 2023-09-22 DIAGNOSIS — M5416 Radiculopathy, lumbar region: Secondary | ICD-10-CM

## 2023-09-24 DIAGNOSIS — M25562 Pain in left knee: Secondary | ICD-10-CM | POA: Diagnosis not present

## 2023-09-28 ENCOUNTER — Ambulatory Visit
Admission: RE | Admit: 2023-09-28 | Discharge: 2023-09-28 | Disposition: A | Discharge status: Home / Self Care | Source: Ambulatory Visit

## 2023-09-28 DIAGNOSIS — M4726 Other spondylosis with radiculopathy, lumbar region: Secondary | ICD-10-CM

## 2023-09-28 DIAGNOSIS — M48061 Spinal stenosis, lumbar region without neurogenic claudication: Secondary | ICD-10-CM | POA: Diagnosis not present

## 2023-09-28 DIAGNOSIS — M5416 Radiculopathy, lumbar region: Secondary | ICD-10-CM

## 2023-09-28 DIAGNOSIS — M4186 Other forms of scoliosis, lumbar region: Secondary | ICD-10-CM | POA: Diagnosis not present

## 2023-09-28 DIAGNOSIS — M5442 Lumbago with sciatica, left side: Secondary | ICD-10-CM | POA: Diagnosis not present

## 2023-10-12 DIAGNOSIS — M5416 Radiculopathy, lumbar region: Secondary | ICD-10-CM | POA: Diagnosis not present

## 2023-10-16 DIAGNOSIS — M25562 Pain in left knee: Secondary | ICD-10-CM | POA: Diagnosis not present

## 2023-11-10 DIAGNOSIS — M5416 Radiculopathy, lumbar region: Secondary | ICD-10-CM | POA: Diagnosis not present

## 2023-11-18 DIAGNOSIS — F339 Major depressive disorder, recurrent, unspecified: Secondary | ICD-10-CM | POA: Diagnosis not present

## 2023-11-25 DIAGNOSIS — M25562 Pain in left knee: Secondary | ICD-10-CM | POA: Diagnosis not present

## 2023-12-08 DIAGNOSIS — M25562 Pain in left knee: Secondary | ICD-10-CM | POA: Diagnosis not present

## 2023-12-08 DIAGNOSIS — D225 Melanocytic nevi of trunk: Secondary | ICD-10-CM | POA: Diagnosis not present

## 2023-12-08 DIAGNOSIS — R531 Weakness: Secondary | ICD-10-CM | POA: Diagnosis not present

## 2023-12-08 DIAGNOSIS — Z1283 Encounter for screening for malignant neoplasm of skin: Secondary | ICD-10-CM | POA: Diagnosis not present

## 2023-12-08 DIAGNOSIS — L820 Inflamed seborrheic keratosis: Secondary | ICD-10-CM | POA: Diagnosis not present

## 2023-12-08 DIAGNOSIS — M25662 Stiffness of left knee, not elsewhere classified: Secondary | ICD-10-CM | POA: Diagnosis not present

## 2023-12-08 DIAGNOSIS — L648 Other androgenic alopecia: Secondary | ICD-10-CM | POA: Diagnosis not present

## 2023-12-14 ENCOUNTER — Emergency Department (HOSPITAL_COMMUNITY)

## 2023-12-14 ENCOUNTER — Emergency Department (HOSPITAL_COMMUNITY)
Admission: EM | Admit: 2023-12-14 | Discharge: 2023-12-14 | Disposition: A | Discharge status: Home / Self Care | Attending: Emergency Medicine | Admitting: Emergency Medicine

## 2023-12-14 ENCOUNTER — Encounter (HOSPITAL_COMMUNITY): Payer: Self-pay | Admitting: Emergency Medicine

## 2023-12-14 DIAGNOSIS — R202 Paresthesia of skin: Secondary | ICD-10-CM | POA: Diagnosis not present

## 2023-12-14 DIAGNOSIS — K5730 Diverticulosis of large intestine without perforation or abscess without bleeding: Secondary | ICD-10-CM | POA: Diagnosis not present

## 2023-12-14 DIAGNOSIS — I700 Atherosclerosis of aorta: Secondary | ICD-10-CM | POA: Diagnosis not present

## 2023-12-14 DIAGNOSIS — R200 Anesthesia of skin: Secondary | ICD-10-CM | POA: Diagnosis not present

## 2023-12-14 DIAGNOSIS — R0789 Other chest pain: Secondary | ICD-10-CM | POA: Diagnosis not present

## 2023-12-14 DIAGNOSIS — R9082 White matter disease, unspecified: Secondary | ICD-10-CM | POA: Diagnosis not present

## 2023-12-14 DIAGNOSIS — R079 Chest pain, unspecified: Secondary | ICD-10-CM | POA: Diagnosis not present

## 2023-12-14 DIAGNOSIS — G459 Transient cerebral ischemic attack, unspecified: Secondary | ICD-10-CM | POA: Diagnosis not present

## 2023-12-14 DIAGNOSIS — Q600 Renal agenesis, unilateral: Secondary | ICD-10-CM | POA: Diagnosis not present

## 2023-12-14 LAB — BASIC METABOLIC PANEL WITH GFR
Anion gap: 10 (ref 5–15)
BUN: 10 mg/dL (ref 8–23)
CO2: 22 mmol/L (ref 22–32)
Calcium: 9.4 mg/dL (ref 8.9–10.3)
Chloride: 109 mmol/L (ref 98–111)
Creatinine, Ser: 0.99 mg/dL (ref 0.44–1.00)
GFR, Estimated: 59 mL/min — ABNORMAL LOW (ref >60)
Glucose, Bld: 99 mg/dL (ref 70–99)
Potassium: 3.5 mmol/L (ref 3.5–5.1)
Sodium: 141 mmol/L (ref 135–145)

## 2023-12-14 LAB — CBC
HCT: 42 % (ref 36.0–46.0)
Hemoglobin: 13.6 g/dL (ref 12.0–15.0)
MCH: 30 pg (ref 26.0–34.0)
MCHC: 32.4 g/dL (ref 30.0–36.0)
MCV: 92.5 fL (ref 80.0–100.0)
Platelets: 263 10*3/uL (ref 150–400)
RBC: 4.54 MIL/uL (ref 3.87–5.11)
RDW: 13.4 % (ref 11.5–15.5)
WBC: 5.3 10*3/uL (ref 4.0–10.5)
nRBC: 0 % (ref 0.0–0.2)

## 2023-12-14 LAB — HEPATIC FUNCTION PANEL
ALT: 18 U/L (ref 0–44)
AST: 29 U/L (ref 15–41)
Albumin: 4 g/dL (ref 3.5–5.0)
Alkaline Phosphatase: 58 U/L (ref 38–126)
Bilirubin, Direct: 0.2 mg/dL (ref 0.0–0.2)
Indirect Bilirubin: 0.6 mg/dL (ref 0.3–0.9)
Total Bilirubin: 0.8 mg/dL (ref 0.0–1.2)
Total Protein: 6.9 g/dL (ref 6.5–8.1)

## 2023-12-14 LAB — LIPASE, BLOOD: Lipase: 32 U/L (ref 11–51)

## 2023-12-14 LAB — MAGNESIUM: Magnesium: 2.1 mg/dL (ref 1.7–2.4)

## 2023-12-14 LAB — TROPONIN I (HIGH SENSITIVITY): Troponin I (High Sensitivity): 3 ng/L (ref <18)

## 2023-12-14 MED ORDER — LORAZEPAM 1 MG PO TABS
1.0000 mg | ORAL_TABLET | Freq: Once | ORAL | Status: AC
  Administered 2023-12-14: 1 mg via ORAL
  Filled 2023-12-14: qty 1

## 2023-12-14 MED ORDER — IOHEXOL 350 MG/ML SOLN
150.0000 mL | Freq: Once | INTRAVENOUS | Status: AC | PRN
  Administered 2023-12-14: 150 mL via INTRAVENOUS

## 2023-12-14 NOTE — ED Triage Notes (Signed)
 Pt complains of dull intermittent chest pain for a few days. Also has had left leg numbness, nausea and jaw pain.

## 2023-12-14 NOTE — Discharge Instructions (Signed)
 Your test results today are reassuring.  Follow-up with your PCP.  Return to the emergency department for any new or worsening symptoms of concern.

## 2023-12-14 NOTE — ED Triage Notes (Signed)
 Pt reports L sided numbness since yesterday with L sided chest pain, nausea, and feeling anxious.  No arm drift.

## 2023-12-14 NOTE — ED Provider Triage Note (Signed)
 Emergency Medicine Provider Triage Evaluation Note  Kylie Wheeler , a 78 y.o. female  was evaluated in triage.  Pt complains of intermittent numbness in her left arm, left leg, has been ongoing for couple of weeks.  Patient also sometimes feels some pain over the left side of her chest..  Review of Systems    Physical Exam  BP (!) 145/84   Pulse 87   Temp 98.8 F (37.1 C) (Oral)   Resp 18   SpO2 100%  Gen:   Awake, no distress   Resp:  Normal effort  MSK:   Moves extremities without difficulty  Other:  Cranial nerves II through XII intact.  Normal gait.  No numbness in bilateral upper extremities presently.  Medical Decision Making  Medically screening exam initiated at 11:55 AM.  Appropriate orders placed.  Kylie Wheeler was informed that the remainder of the evaluation will be completed by another provider, this initial triage assessment does not replace that evaluation, and the importance of remaining in the ED until their evaluation is complete.  Intermittent left-sided chest pain, intermittent left-sided numbness.  Given the lateralizing symptoms, will obtain MRI of brain.  Chest pain workup initiated.  Initial EKG nonischemic.   Kylie Pac T, DO 12/14/23 1157

## 2023-12-14 NOTE — ED Provider Notes (Signed)
 [place] EMERGENCY DEPARTMENT AT [facility] Provider Note   CSN: [id number] Arrival date & time: 12/14/23  1113     Patient presents with: Chest Pain and L sided numbness   Kylie Wheeler is a 78 y.o. female.    Chest Pain Associated symptoms: numbness   Patient presents for left-sided numbness.  Medical history includes migraines, HLD, GERD, osteoporosis.  She is not on a blood thinner.  She states that she first noticed numbness in her left leg a week ago.  She would notices when she was sitting with her legs crossed.  Today, she noticed it when she was walking.  This was unusual for her.  She also experienced some numbness in her left arm.  Currently, numbness has resolved.  Patient describes intermittent left-sided chest pain that has been ongoing for months.  She did experience some today.  Currently, she has a very faint soreness which she describes as similar to a pulled muscle.  She denies any other current or recent symptoms.  The patient was previously seen by Dr. Ladona for musculoskeletal chest pain.  Last office visit was over 1 year ago.     Prior to Admission medications   Medication Sig Start Date End Date Taking? Authorizing Provider  acyclovir  (ZOVIRAX ) 200 MG capsule Take 200 mg by mouth as needed (cold sores).   Yes [provider]  buPROPion (WELLBUTRIN XL) 150 MG 24 hr tablet Take 150 mg by mouth daily.   Yes [provider]  Cholecalciferol (VITAMIN D3) 2000 UNITS TABS Take 1 tablet by mouth daily.   Yes [provider]  cyclobenzaprine (FLEXERIL) 10 MG tablet Take 10 mg by mouth 2 (two) times daily as needed. 09/01/23  Yes [provider]  fluticasone  (FLONASE ) 50 MCG/ACT nasal spray Place 1 spray into both nostrils as needed for rhinitis or allergies. 11/18/15  Yes [provider]  Omega-3 Fatty Acids (FISH OIL) 1000 MG CPDR Take 1 capsule by mouth daily.   Yes [provider]  pregabalin (LYRICA)  100 MG capsule Take 100 mg by mouth at bedtime.   Yes [provider]  Probiotic Product (ALIGN) 4 MG CAPS Take 1 capsule by mouth daily.   Yes [provider]  simvastatin  (ZOCOR ) 20 MG tablet Take 1 tablet (20 mg total) by mouth at bedtime. Pt needs to schedule f/u appt for further refills. Patient taking differently: Take 20 mg by mouth 2 (two) times a week. Pt needs to schedule f/u appt for further refills. 04/16/20  Yes Ladona Heinz, MD  TURMERIC PO Take 1 capsule by mouth daily.   Yes [provider]  VYVANSE 40 MG capsule Take 40 mg by mouth every morning. 10/29/23  Yes [provider]  zonisamide (ZONEGRAN) 50 MG capsule Take 150 mg by mouth at bedtime. 03/19/21  Yes [provider]    Allergies: Aspirin, Lactose, and Codeine    Review of Systems  Cardiovascular:  Positive for chest pain.  Neurological:  Positive for numbness.  Psychiatric/Behavioral:  The patient is nervous/anxious.   All other systems reviewed and are negative.   Updated Vital Signs BP (!) 162/93 (BP Location: Right Arm)   Pulse 80   Temp (!) 97.4 F (36.3 C) (Oral)   Resp 18   SpO2 100%   Physical Exam Vitals and nursing note reviewed.  Constitutional:      General: She is not in acute distress.    Appearance: She is well-developed. She is  not ill-appearing, toxic-appearing or diaphoretic.  HENT:     Head: Normocephalic and atraumatic.   Eyes:     Conjunctiva/sclera: Conjunctivae normal.    Cardiovascular:     Rate and Rhythm: Normal rate and regular rhythm.     Heart sounds: No murmur heard. Pulmonary:     Effort: Pulmonary effort is normal. No respiratory distress.  Chest:     Chest wall: Tenderness present.  Abdominal:     Palpations: Abdomen is soft.     Tenderness: There is no abdominal tenderness.   Musculoskeletal:        General: No swelling. Normal range of motion.     Cervical back: Normal range of motion and neck supple.   Skin:     General: Skin is warm and dry.     Capillary Refill: Capillary refill takes less than 2 seconds.     Coloration: Skin is not cyanotic or pale.   Neurological:     General: No focal deficit present.     Mental Status: She is alert and oriented to person, place, and time.   Psychiatric:        Mood and Affect: Mood normal.        Behavior: Behavior normal.     (all labs ordered are listed, but only abnormal results are displayed) Labs Reviewed  BASIC METABOLIC PANEL WITH GFR - Abnormal; Notable for the following components:      Result Value   GFR, Estimated 59 (*)    All other components within normal limits  CBC  HEPATIC FUNCTION PANEL  LIPASE, BLOOD  MAGNESIUM  TROPONIN I (HIGH SENSITIVITY)    EKG: EKG Interpretation Date/Time:  Tuesday December 14 2023 11:34:26 EDT Ventricular Rate:  83 PR Interval:  164 QRS Duration:  70 QT Interval:  356 QTC Calculation: 418 R Axis:   44  Text Interpretation: Normal sinus rhythm Low voltage QRS Borderline ECG When compared with ECG of 02-Oct-2012 12:18, PREVIOUS ECG IS PRESENT Confirmed by Mannie Pac [id number]) on 12/14/2023 11:58:12 AM  Radiology: CT Angio Chest/Abd/Pel for Dissection W and/or Wo Contrast Result Date: 12/14/2023 CLINICAL DATA:  Acute aortic syndrome, intermittent numbness in left arm and left leg for couple weeks, left-sided chest pain EXAM: CT ANGIOGRAPHY CHEST, ABDOMEN AND PELVIS CT ANGIOGRAPHY LEFT UPPER EXTREMITY TECHNIQUE: Non-contrast CT of the chest was initially obtained. Multidetector CT imaging through the chest, abdomen and pelvis was performed using the standard protocol during bolus administration of intravenous contrast. Multiplanar reconstructed images and MIPs were obtained and reviewed to evaluate the vascular anatomy. Multidetector CT imaging through the left upper extremity was performed using the standard protocol during bolus administration of intravenous contrast. Multiplanar reconstructed images and  MIPs were obtained and reviewed to evaluate the vascular anatomy. RADIATION DOSE REDUCTION: This exam was performed according to the departmental dose-optimization program which includes automated exposure control, adjustment of the mA and/or kV according to patient size and/or use of iterative reconstruction technique. CONTRAST:  OMNIPAQUE  IOHEXOL  350 MG/ML SOLN COMPARISON:  None Available. FINDINGS: CTA CHEST FINDINGS VASCULAR Aorta: Satisfactory opacification of the aorta. Normal contour and caliber of the thoracic aorta. No evidence of aneurysm, dissection, or other acute aortic pathology. Scattered aortic atherosclerosis. Cardiovascular: No evidence of pulmonary embolism on limited non-tailored examination. Normal heart size. No pericardial effusion. Review of the MIP images confirms the above findings. NON VASCULAR Mediastinum/Nodes: No enlarged mediastinal, hilar, or axillary lymph nodes. Thyroid  gland, trachea, and esophagus demonstrate no significant findings. Lungs/Pleura: Mild  dependent bibasilar scarring or atelectasis. No pleural effusion or pneumothorax. Musculoskeletal: No chest wall abnormality. No acute osseous findings. Review of the MIP images confirms the above findings. CTA ABDOMEN AND PELVIS FINDINGS VASCULAR Normal contour and caliber of the abdominal aorta. No evidence of aneurysm, dissection, or other acute aortic pathology. Standard branching pattern of the abdominal aorta with solitary bilateral renal arteries. Scattered aortic atherosclerosis Review of the MIP images confirms the above findings. NON-VASCULAR Hepatobiliary: No solid liver abnormality is seen. No gallstones, gallbladder wall thickening, or biliary dilatation. Pancreas: Unremarkable. No pancreatic ductal dilatation or surrounding inflammatory changes. Spleen: Normal in size without significant abnormality. Adrenals/Urinary Tract: Adrenal glands are unremarkable. Kidneys are normal, without renal calculi, solid lesion,  or hydronephrosis. Bladder is unremarkable. Stomach/Bowel: Stomach is within normal limits. Appendix appears normal. No evidence of bowel wall thickening, distention, or inflammatory changes. Sigmoid diverticulosis. Lymphatic: No enlarged abdominal or pelvic lymph nodes. Reproductive: No mass or other significant abnormality. Other: No abdominal wall hernia or abnormality. No ascites. Musculoskeletal: No acute osseous findings. Levoscoliosis of the lumbar spine CTA LEFT UPPER EXTREMITY FINDINGS VASCULAR Left upper extremity arterial vasculature is patent to the level of the wrist. Left subclavian, axillary, brachial, radial, ulnar, and interosseous arteries are patent to the wrist without significant atherosclerosis or stenosis. Evaluation of the vessels of the hand is somewhat limited by early contrast bolus without obvious abnormality. NON-VASCULAR Musculoskeletal: No fracture or dislocation. Soft tissues: Unremarkable. IMPRESSION: 1. Normal contour and caliber of the thoracic and abdominal aorta. No evidence of aneurysm, dissection, or other acute aortic pathology. Scattered aortic atherosclerosis. 2. Normal left upper extremity vasculature without significant atherosclerosis or stenosis. Evaluation of the vessels of the hand is somewhat limited by early contrast bolus. 3. No acute CT findings of the chest, abdomen, or pelvis. 4. Sigmoid diverticulosis without evidence of acute diverticulitis. Aortic Atherosclerosis (ICD10-I70.0). Electronically Signed   By: Marolyn JONETTA Jaksch M.D.   On: 12/14/2023 16:18   CT ANGIO UP EXTREM LEFT W &/OR WO CONTAST Result Date: 12/14/2023 CLINICAL DATA:  Acute aortic syndrome, intermittent numbness in left arm and left leg for couple weeks, left-sided chest pain EXAM: CT ANGIOGRAPHY CHEST, ABDOMEN AND PELVIS CT ANGIOGRAPHY LEFT UPPER EXTREMITY TECHNIQUE: Non-contrast CT of the chest was initially obtained. Multidetector CT imaging through the chest, abdomen and pelvis was performed  using the standard protocol during bolus administration of intravenous contrast. Multiplanar reconstructed images and MIPs were obtained and reviewed to evaluate the vascular anatomy. Multidetector CT imaging through the left upper extremity was performed using the standard protocol during bolus administration of intravenous contrast. Multiplanar reconstructed images and MIPs were obtained and reviewed to evaluate the vascular anatomy. RADIATION DOSE REDUCTION: This exam was performed according to the departmental dose-optimization program which includes automated exposure control, adjustment of the mA and/or kV according to patient size and/or use of iterative reconstruction technique. CONTRAST:  OMNIPAQUE  IOHEXOL  350 MG/ML SOLN COMPARISON:  None Available. FINDINGS: CTA CHEST FINDINGS VASCULAR Aorta: Satisfactory opacification of the aorta. Normal contour and caliber of the thoracic aorta. No evidence of aneurysm, dissection, or other acute aortic pathology. Scattered aortic atherosclerosis. Cardiovascular: No evidence of pulmonary embolism on limited non-tailored examination. Normal heart size. No pericardial effusion. Review of the MIP images confirms the above findings. NON VASCULAR Mediastinum/Nodes: No enlarged mediastinal, hilar, or axillary lymph nodes. Thyroid  gland, trachea, and esophagus demonstrate no significant findings. Lungs/Pleura: Mild dependent bibasilar scarring or atelectasis. No pleural effusion or pneumothorax. Musculoskeletal: No chest wall abnormality. No  acute osseous findings. Review of the MIP images confirms the above findings. CTA ABDOMEN AND PELVIS FINDINGS VASCULAR Normal contour and caliber of the abdominal aorta. No evidence of aneurysm, dissection, or other acute aortic pathology. Standard branching pattern of the abdominal aorta with solitary bilateral renal arteries. Scattered aortic atherosclerosis Review of the MIP images confirms the above findings. NON-VASCULAR  Hepatobiliary: No solid liver abnormality is seen. No gallstones, gallbladder wall thickening, or biliary dilatation. Pancreas: Unremarkable. No pancreatic ductal dilatation or surrounding inflammatory changes. Spleen: Normal in size without significant abnormality. Adrenals/Urinary Tract: Adrenal glands are unremarkable. Kidneys are normal, without renal calculi, solid lesion, or hydronephrosis. Bladder is unremarkable. Stomach/Bowel: Stomach is within normal limits. Appendix appears normal. No evidence of bowel wall thickening, distention, or inflammatory changes. Sigmoid diverticulosis. Lymphatic: No enlarged abdominal or pelvic lymph nodes. Reproductive: No mass or other significant abnormality. Other: No abdominal wall hernia or abnormality. No ascites. Musculoskeletal: No acute osseous findings. Levoscoliosis of the lumbar spine CTA LEFT UPPER EXTREMITY FINDINGS VASCULAR Left upper extremity arterial vasculature is patent to the level of the wrist. Left subclavian, axillary, brachial, radial, ulnar, and interosseous arteries are patent to the wrist without significant atherosclerosis or stenosis. Evaluation of the vessels of the hand is somewhat limited by early contrast bolus without obvious abnormality. NON-VASCULAR Musculoskeletal: No fracture or dislocation. Soft tissues: Unremarkable. IMPRESSION: 1. Normal contour and caliber of the thoracic and abdominal aorta. No evidence of aneurysm, dissection, or other acute aortic pathology. Scattered aortic atherosclerosis. 2. Normal left upper extremity vasculature without significant atherosclerosis or stenosis. Evaluation of the vessels of the hand is somewhat limited by early contrast bolus. 3. No acute CT findings of the chest, abdomen, or pelvis. 4. Sigmoid diverticulosis without evidence of acute diverticulitis. Aortic Atherosclerosis (ICD10-I70.0). Electronically Signed   By: Marolyn JONETTA Jaksch M.D.   On: 12/14/2023 16:18   MR BRAIN WO CONTRAST Result Date:  12/14/2023 CLINICAL DATA:  Transient ischemic attack (TIA) EXAM: MRI HEAD WITHOUT CONTRAST TECHNIQUE: Multiplanar, multiecho pulse sequences of the brain and surrounding structures were obtained without intravenous contrast. COMPARISON:  MRI of the brain dated June 19, 2015. FINDINGS: Brain: There are numerous foci of increased T2 signal present throughout the cerebral white matter. There is no restricted diffusion to indicate acute or recent infarction. There is no evidence of hemorrhage, mass, cortical infarct or hydrocephalus. Vascular: Normal vascular flow voids. Skull and upper cervical spine: Normal marrow signal. No bony lesions. Sinuses/Orbits: Clear paranasal sinuses.  Normal orbits. Other: None. IMPRESSION: 1. Extensive cerebral white matter disease. No apparent acute process. Electronically Signed   By: Evalene Coho M.D.   On: 12/14/2023 14:16   DG Chest 2 View Result Date: 12/14/2023 CLINICAL DATA:  Chest pain on the left.  Left-sided numbness EXAM: CHEST - 2 VIEW COMPARISON:  X-ray image only 10/19/2003. FINDINGS: No consolidation, pneumothorax or effusion. No edema. Normal cardiopericardial silhouette. Curvature of the spine with degenerative changes. Surgical hardware along the cervical spine at the edge of the imaging field. IMPRESSION: No acute cardiopulmonary disease. Electronically Signed   By: Ranell Bring M.D.   On: 12/14/2023 12:25     Procedures   Medications Ordered in the ED  LORazepam (ATIVAN) tablet 1 mg (1 mg Oral Given 12/14/23 1257)  iohexol  (OMNIPAQUE ) 350 MG/ML injection 150 mL (150 mLs Intravenous Contrast Given 12/14/23 1601)  Medical Decision Making Amount and/or Complexity of Data Reviewed Labs: ordered. Radiology: ordered.  Risk Prescription drug management.   This patient presents to the ED for concern of multiple complaints, this involves an extensive number of treatment options, and is a complaint that carries with  it a high risk of complications and morbidity.  The differential diagnosis includes ACS, CVA, TIA, dissection, vasculitis, PAD   Co morbidities / Chronic conditions that complicate the patient evaluation  migraines, HLD, GERD, osteoporosis   Additional history obtained:  Additional history obtained from EMR External records from outside source obtained and reviewed including N/A   Lab Tests:  I Ordered, and personally interpreted labs.  The pertinent results include: Normal hemoglobin, no leukocytosis, normal kidney function, normal electrolytes, normal troponin   Imaging Studies ordered:  I ordered imaging studies including chest x-ray, CTA dissection study, CTA left upper extremity, MRI brain I independently visualized and interpreted imaging which showed no acute findings I agree with the radiologist interpretation   Cardiac Monitoring: / EKG:  The patient was maintained on a cardiac monitor.  I personally viewed and interpreted the cardiac monitored which showed an underlying rhythm of: Sinus rhythm   Problem List / ED Course / Critical interventions / Medication management  Patient presents for transient left-sided numbness.  Although she has had intermittent left leg numbness over the past week, she had left leg and left arm numbness today.  The symptoms have currently resolved.  She has also had intermittent left-sided chest pain.  On arrival, vital signs are normal.  Patient is well-appearing on exam.  She does not have any focal deficits.  She denies any areas of diminished sensation.  Patient's lab work was unremarkable.  Imaging studies did not show any acute findings.  Patient remained asymptomatic while in the ED.  She was given reassurance and discharged in stable condition.  Social Determinants of Health:  Lives independently     Final diagnoses:  Left sided numbness    ED Discharge Orders     None          Melvenia Motto, MD 12/14/23 1657

## 2023-12-21 DIAGNOSIS — R531 Weakness: Secondary | ICD-10-CM | POA: Diagnosis not present

## 2023-12-21 DIAGNOSIS — M25662 Stiffness of left knee, not elsewhere classified: Secondary | ICD-10-CM | POA: Diagnosis not present

## 2023-12-21 DIAGNOSIS — M25562 Pain in left knee: Secondary | ICD-10-CM | POA: Diagnosis not present

## 2023-12-23 DIAGNOSIS — M25562 Pain in left knee: Secondary | ICD-10-CM | POA: Diagnosis not present

## 2023-12-23 DIAGNOSIS — M25662 Stiffness of left knee, not elsewhere classified: Secondary | ICD-10-CM | POA: Diagnosis not present

## 2023-12-23 DIAGNOSIS — R531 Weakness: Secondary | ICD-10-CM | POA: Diagnosis not present

## 2023-12-28 DIAGNOSIS — K219 Gastro-esophageal reflux disease without esophagitis: Secondary | ICD-10-CM | POA: Diagnosis not present

## 2023-12-28 DIAGNOSIS — N1831 Chronic kidney disease, stage 3a: Secondary | ICD-10-CM | POA: Diagnosis not present

## 2023-12-28 DIAGNOSIS — H2513 Age-related nuclear cataract, bilateral: Secondary | ICD-10-CM | POA: Diagnosis not present

## 2024-01-18 DIAGNOSIS — K080 Exfoliation of teeth due to systemic causes: Secondary | ICD-10-CM | POA: Diagnosis not present

## 2024-01-27 DIAGNOSIS — H2512 Age-related nuclear cataract, left eye: Secondary | ICD-10-CM | POA: Diagnosis not present

## 2024-02-03 DIAGNOSIS — M25562 Pain in left knee: Secondary | ICD-10-CM | POA: Diagnosis not present

## 2024-02-07 DIAGNOSIS — M25562 Pain in left knee: Secondary | ICD-10-CM | POA: Diagnosis not present

## 2024-02-10 DIAGNOSIS — M25562 Pain in left knee: Secondary | ICD-10-CM | POA: Diagnosis not present

## 2024-02-17 DIAGNOSIS — M1712 Unilateral primary osteoarthritis, left knee: Secondary | ICD-10-CM | POA: Diagnosis not present

## 2024-02-18 DIAGNOSIS — M25562 Pain in left knee: Secondary | ICD-10-CM | POA: Diagnosis not present

## 2024-02-21 DIAGNOSIS — M25562 Pain in left knee: Secondary | ICD-10-CM | POA: Diagnosis not present

## 2024-02-24 DIAGNOSIS — M1712 Unilateral primary osteoarthritis, left knee: Secondary | ICD-10-CM | POA: Diagnosis not present

## 2024-02-24 DIAGNOSIS — M25562 Pain in left knee: Secondary | ICD-10-CM | POA: Diagnosis not present

## 2024-02-28 DIAGNOSIS — M25562 Pain in left knee: Secondary | ICD-10-CM | POA: Diagnosis not present

## 2024-03-02 DIAGNOSIS — M25562 Pain in left knee: Secondary | ICD-10-CM | POA: Diagnosis not present

## 2024-03-02 DIAGNOSIS — M1712 Unilateral primary osteoarthritis, left knee: Secondary | ICD-10-CM | POA: Diagnosis not present

## 2024-03-13 DIAGNOSIS — G43719 Chronic migraine without aura, intractable, without status migrainosus: Secondary | ICD-10-CM | POA: Diagnosis not present

## 2024-03-30 DIAGNOSIS — H2511 Age-related nuclear cataract, right eye: Secondary | ICD-10-CM | POA: Diagnosis not present

## 2024-03-30 DIAGNOSIS — H25811 Combined forms of age-related cataract, right eye: Secondary | ICD-10-CM | POA: Diagnosis not present

## 2024-03-31 ENCOUNTER — Other Ambulatory Visit: Payer: Self-pay | Admitting: Endocrinology

## 2024-03-31 DIAGNOSIS — Z1231 Encounter for screening mammogram for malignant neoplasm of breast: Secondary | ICD-10-CM

## 2024-04-19 ENCOUNTER — Ambulatory Visit
Admission: RE | Admit: 2024-04-19 | Discharge: 2024-04-19 | Disposition: A | Discharge status: Home / Self Care | Source: Ambulatory Visit | Attending: Endocrinology | Admitting: Endocrinology

## 2024-04-19 DIAGNOSIS — Z1231 Encounter for screening mammogram for malignant neoplasm of breast: Secondary | ICD-10-CM | POA: Diagnosis not present

## 2024-05-09 DIAGNOSIS — M25562 Pain in left knee: Secondary | ICD-10-CM | POA: Diagnosis not present

## 2024-05-09 DIAGNOSIS — M5417 Radiculopathy, lumbosacral region: Secondary | ICD-10-CM | POA: Diagnosis not present

## 2024-05-09 DIAGNOSIS — M545 Low back pain, unspecified: Secondary | ICD-10-CM | POA: Diagnosis not present

## 2024-05-10 DIAGNOSIS — M5416 Radiculopathy, lumbar region: Secondary | ICD-10-CM | POA: Diagnosis not present

## 2024-05-10 DIAGNOSIS — M5116 Intervertebral disc disorders with radiculopathy, lumbar region: Secondary | ICD-10-CM | POA: Diagnosis not present

## 2024-05-24 DIAGNOSIS — R202 Paresthesia of skin: Secondary | ICD-10-CM | POA: Diagnosis not present

## 2024-05-24 DIAGNOSIS — M79605 Pain in left leg: Secondary | ICD-10-CM | POA: Diagnosis not present

## 2024-05-24 DIAGNOSIS — M5459 Other low back pain: Secondary | ICD-10-CM | POA: Diagnosis not present

## 2024-05-29 DIAGNOSIS — M5416 Radiculopathy, lumbar region: Secondary | ICD-10-CM | POA: Diagnosis not present

## 2024-06-01 DIAGNOSIS — M5459 Other low back pain: Secondary | ICD-10-CM | POA: Diagnosis not present

## 2024-06-01 DIAGNOSIS — R202 Paresthesia of skin: Secondary | ICD-10-CM | POA: Diagnosis not present

## 2024-06-01 DIAGNOSIS — M79605 Pain in left leg: Secondary | ICD-10-CM | POA: Diagnosis not present

## 2024-06-22 ENCOUNTER — Ambulatory Visit: Attending: Cardiology | Admitting: Cardiology

## 2024-06-22 ENCOUNTER — Encounter: Payer: Self-pay | Admitting: Cardiology

## 2024-06-22 VITALS — BP 147/83 | HR 78 | Resp 16 | Ht 61.0 in | Wt 155.7 lb

## 2024-06-22 DIAGNOSIS — R030 Elevated blood-pressure reading, without diagnosis of hypertension: Secondary | ICD-10-CM

## 2024-06-22 DIAGNOSIS — R12 Heartburn: Secondary | ICD-10-CM | POA: Diagnosis not present

## 2024-06-22 DIAGNOSIS — E7800 Pure hypercholesterolemia, unspecified: Secondary | ICD-10-CM

## 2024-06-22 NOTE — Patient Instructions (Addendum)
 Medication Instructions:  Your physician recommends that you continue on your current medications as directed. Please refer to the Current Medication list given to you today.  *If you need a refill on your cardiac medications before your next appointment, please call your pharmacy*   Follow-Up: At [facility], you and your health needs are our priority.  As part of our continuing mission to provide you with exceptional heart care, our providers are all part of one team.  This team includes your primary Cardiologist (physician) and Advanced Practice Providers or APPs (Physician Assistants and Nurse Practitioners) who all work together to provide you with the care you need, when you need it.  Your next appointment:    As Needed  Provider:   Gordy Bergamo, MD

## 2024-06-22 NOTE — Progress Notes (Signed)
 " Cardiology Office Note:  .   Date:  06/22/2024  ID:  Kylie Wheeler, DOB [birth year], MRN [id number] PCP: Nichole Senior, MD  [facility] Providers Cardiologist:  Gordy Bergamo, MD   History of Present Illness: .   Kylie Wheeler is a 79 y.o. female patient with hypercholesterolemia, chronic left-sided chest pain felt to be musculoskeletal, she has had a negative treadmill exercise stress test on 03/22/2019 and a normal echocardiogram at that time, last seen by me on 10/05/2022 for dyspnea on exertion.  I felt her dyspnea was related to deconditioning and we had discussed water aerobics and daily exercise.  She made an appointment to see me due to new heart burning sensation occurring at rest and unrelated to exertion. Takes Web Designer with relief.   She had presented in 12/14/23 with left arm numbness MRI of the brain revealed extensive white matter disease but no acute process and CTA of the upper extremities and chest and abdomen revealed unremarkable angiogram except for mild aortic atherosclerosis.    Discussed the use of AI scribe software for clinical note transcription with the patient, who gave verbal consent to proceed.  History of Present Illness Kylie Wheeler is a 79 year old who presents with new onset indigestion and concerns about heart disease.  She reports frequent new indigestion for the past couple of months, more often in the evening but sometimes during the day. Apple cider vinegar usually relieves symptoms, though once it was slower to help. She denies associated cold sweats.  She is worried the indigestion may be cardiac because of a family history of heart disease and knowing a business associate who had frequent heartburn before a fatal heart attack.  Her recent home blood pressures are around 150/82. She is not taking antihypertensive medication.  She has high cholesterol with prior LDL 164 mg/dL and total cholesterol 833 mg/dL, HDL 68 mg/dL. She  stopped simvastatin  20 mg due to concerns about side effects.  She has scoliosis and a torn left knee meniscus that limit physical activity. She is a widow and lives alone.  Cardiac Studies relevent.    Echocardiogram 03/14/2019: Left ventricle cavity is normal in size. Normal left ventricular wall thickness. Normal LV systolic function with EF 64%. Normal global wall motion. Normal diastolic filling pattern.  Trileaflet aortic valve with no regurgitation noted. Mild (Grade I) mitral regurgitation. Mild tricuspid regurgitation. No evidence of pulmonary hypertension.   Exercise treadmill stress test performed using Bruce protocol 03/22/2019:   Patient reached 8.6 METS, and 112% of age predicted maximum heart rate.  Exercise capacity was low normal.  No chest pain reported.  Normal heart rate and hemodynamic response. Stress EKG revealed no ischemic changes. Normal stress test. EKG:     EKG 12/14/2023: Normal sinus rhythm at rate of 83 bpm.  Normal EKG. Labs   Recent Labs    12/14/23 1120  NA 141  K 3.5  CL 109  CO2 22  GLUCOSE 99  BUN 10  CREATININE 0.99  CALCIUM 9.4  GFRNONAA 59*    Lab Results  Component Value Date   ALT 18 12/14/2023   AST 29 12/14/2023   ALKPHOS 58 12/14/2023   BILITOT 0.8 12/14/2023      Latest Ref Rng & Units 12/14/2023   11:20 AM 06/30/2022   10:30 AM 07/31/2014    7:43 PM  CBC  WBC 4.0 - 10.5 K/uL 5.3  4.1  8.0   Hemoglobin 12.0 - 15.0  g/dL 86.3  88.2  86.0   Hematocrit 36.0 - 46.0 % 42.0  36.5  42.5   Platelets 150 - 400 K/uL 263  238  208    No results found for: HGBA1C  Lab Results  Component Value Date   TSH 2.440 04/04/2019    Care everywhere/Faxed External Labs:  Lipids reviewed from PCP Jan 2025  ROS  Review of Systems  Cardiovascular:  Negative for chest pain, dyspnea on exertion and leg swelling.  Gastrointestinal:  Positive for heartburn.   Physical Exam:   VS:  BP (!) 147/83 (BP Location: Left Arm, Patient Position:  Sitting, Cuff Size: Normal)   Pulse 78   Resp 16   Ht 5' 1 (1.549 m)   Wt 155 lb 11.2 oz (70.6 kg)   SpO2 99%   BMI 29.42 kg/m    Wt Readings from Last 3 Encounters:  06/22/24 155 lb 11.2 oz (70.6 kg)  10/05/22 165 lb 12.8 oz (75.2 kg)  06/30/22 160 lb 11.2 oz (72.9 kg)    BP Readings from Last 3 Encounters:  06/22/24 (!) 147/83  12/14/23 (!) 162/93  10/05/22 128/70   Physical Exam Neck:     Vascular: No carotid bruit or JVD.  Cardiovascular:     Rate and Rhythm: Normal rate and regular rhythm.     Pulses: Intact distal pulses.     Heart sounds: Normal heart sounds. No murmur heard.    No gallop.  Pulmonary:     Effort: Pulmonary effort is normal.     Breath sounds: Normal breath sounds.  Abdominal:     General: Bowel sounds are normal.     Palpations: Abdomen is soft.  Musculoskeletal:     Right lower leg: No edema.     Left lower leg: No edema.     ASSESSMENT AND PLAN: .      ICD-10-CM   1. Heart burn  R12     2. Prehypertension  R03.0     3. Hypercholesteremia  E78.00      Assessment & Plan Heartburn likely due to gastroesophageal reflux disease Intermittent heartburn, primarily in the evening, likely due to gastroesophageal reflux disease. Symptoms are not associated with physical activity, suggesting it is not angina. No significant changes in EKG or physical exam to suggest cardiac etiology. - Use over-the-counter omeprazole for 2-3 days as needed for heartburn. - Consider Tums for immediate relief of heartburn symptoms. - Continue using apple cider vinegar as a home remedy for heartburn.  Prehypertension Blood pressure readings at home are typically around 132/70 mmHg, but recent readings in the office were 147/83 mmHg, indicating prehypertension. No history of hypertension. Discussed lifestyle modifications to manage blood pressure, including weight loss, exercise, and reducing salt intake. Emphasized the importance of monitoring blood pressure at home  and reporting readings to the primary care physician. - Monitor blood pressure at home, especially in the morning after resting for 15-30 minutes. - Record blood pressure readings and bring them to the primary care physician. - Consider lifestyle modifications such as weight loss, exercise, and reducing salt intake to manage blood pressure.  Hypercholesterolemia Patient's labs from January 2025 revealing LDL cholesterol is elevated at 164 mg/dL, with total cholesterol at 266 mg/dL. She is not currently taking simvastatin  due to concerns about side effects. Discussed the potential benefits of statin therapy, including the option of combining simvastatin  with ezetimibe (Zetia) for better efficacy. Emphasized the importance of managing cholesterol to prevent cardiovascular events.  Discussed the  option of a coronary calcium score to assess plaque buildup. Explained that the combination of simvastatin  and Zetia works four times better than simvastatin  alone. - Will discuss with primary care physician about starting back simvastatin  20 mg daily. She is reluctant to start statins, primary prevention discussed.  - Consider adding ezetimibe (Zetia) to simvastatin  for better cholesterol management. - Will consider coronary calcium score to assess plaque buildup. - Will discuss cholesterol management plan with primary care physician after upcoming physical.  Follow up: PRN  Signed,  Gordy Bergamo, MD, [facility] 06/22/2024, 2:03 PM [facility] [address] [place], [place] [address] Phone: [phone]. Fax:  [phone]  "

## 2024-06-23 ENCOUNTER — Encounter: Payer: Self-pay | Admitting: Cardiology
